# Patient Record
Sex: Male | Born: 1935 | Race: White | Hispanic: No | Marital: Married | Smoking: Former smoker
Health system: Southern US, Community
[De-identification: ages and names within clinical notes are randomized; demographics above are authoritative.]

## PROBLEM LIST (undated history)

## (undated) DIAGNOSIS — M1711 Unilateral primary osteoarthritis, right knee: Secondary | ICD-10-CM

## (undated) DIAGNOSIS — N210 Calculus in bladder: Secondary | ICD-10-CM

## (undated) DIAGNOSIS — Z87442 Personal history of urinary calculi: Secondary | ICD-10-CM

## (undated) DIAGNOSIS — K469 Unspecified abdominal hernia without obstruction or gangrene: Secondary | ICD-10-CM

## (undated) DIAGNOSIS — I4510 Unspecified right bundle-branch block: Secondary | ICD-10-CM

## (undated) DIAGNOSIS — D869 Sarcoidosis, unspecified: Secondary | ICD-10-CM

## (undated) DIAGNOSIS — K922 Gastrointestinal hemorrhage, unspecified: Secondary | ICD-10-CM

## (undated) DIAGNOSIS — R7981 Abnormal blood-gas level: Secondary | ICD-10-CM

## (undated) DIAGNOSIS — N400 Enlarged prostate without lower urinary tract symptoms: Secondary | ICD-10-CM

## (undated) DIAGNOSIS — E785 Hyperlipidemia, unspecified: Secondary | ICD-10-CM

## (undated) HISTORY — DX: Hyperlipidemia, unspecified: E78.5

## (undated) HISTORY — DX: Sarcoidosis, unspecified: D86.9

---

## 1998-07-20 ENCOUNTER — Emergency Department (HOSPITAL_COMMUNITY): Admission: EM | Admit: 1998-07-20 | Discharge: 1998-07-20 | Payer: Self-pay | Admitting: Emergency Medicine

## 1999-01-01 ENCOUNTER — Encounter: Payer: Self-pay | Admitting: Orthopedic Surgery

## 1999-01-01 ENCOUNTER — Ambulatory Visit (HOSPITAL_COMMUNITY): Admission: RE | Admit: 1999-01-01 | Discharge: 1999-01-01 | Payer: Self-pay | Admitting: Orthopedic Surgery

## 1999-04-06 ENCOUNTER — Emergency Department (HOSPITAL_COMMUNITY): Admission: EM | Admit: 1999-04-06 | Discharge: 1999-04-06 | Payer: Self-pay | Admitting: Emergency Medicine

## 2002-05-26 HISTORY — PX: CYSTOSCOPY: SHX5120

## 2002-10-19 ENCOUNTER — Ambulatory Visit (HOSPITAL_BASED_OUTPATIENT_CLINIC_OR_DEPARTMENT_OTHER): Admission: RE | Admit: 2002-10-19 | Discharge: 2002-10-19 | Payer: Self-pay | Admitting: Urology

## 2003-10-10 ENCOUNTER — Encounter: Admission: RE | Admit: 2003-10-10 | Discharge: 2003-10-10 | Payer: Self-pay | Admitting: Internal Medicine

## 2003-11-08 ENCOUNTER — Encounter (INDEPENDENT_AMBULATORY_CARE_PROVIDER_SITE_OTHER): Payer: Self-pay | Admitting: Specialist

## 2003-11-08 ENCOUNTER — Ambulatory Visit (HOSPITAL_COMMUNITY): Admission: RE | Admit: 2003-11-08 | Discharge: 2003-11-08 | Payer: Self-pay | Admitting: Critical Care Medicine

## 2003-11-08 ENCOUNTER — Encounter (INDEPENDENT_AMBULATORY_CARE_PROVIDER_SITE_OTHER): Payer: Self-pay | Admitting: *Deleted

## 2004-01-18 ENCOUNTER — Ambulatory Visit (HOSPITAL_COMMUNITY): Admission: RE | Admit: 2004-01-18 | Discharge: 2004-01-18 | Payer: Self-pay | Admitting: Critical Care Medicine

## 2004-04-09 ENCOUNTER — Ambulatory Visit: Payer: Self-pay | Admitting: Gastroenterology

## 2004-04-09 ENCOUNTER — Ambulatory Visit: Payer: Self-pay | Admitting: Critical Care Medicine

## 2004-04-23 ENCOUNTER — Ambulatory Visit: Payer: Self-pay | Admitting: Gastroenterology

## 2004-05-30 ENCOUNTER — Ambulatory Visit: Payer: Self-pay | Admitting: Critical Care Medicine

## 2004-07-03 ENCOUNTER — Ambulatory Visit: Payer: Self-pay | Admitting: Critical Care Medicine

## 2005-05-26 HISTORY — PX: ANTERIOR CERVICAL DECOMP/DISCECTOMY FUSION: SHX1161

## 2005-05-26 HISTORY — PX: BRONCHOSCOPY: SUR163

## 2005-08-13 ENCOUNTER — Ambulatory Visit: Payer: Self-pay | Admitting: Critical Care Medicine

## 2005-09-03 ENCOUNTER — Ambulatory Visit: Payer: Self-pay | Admitting: Critical Care Medicine

## 2005-09-04 ENCOUNTER — Ambulatory Visit: Payer: Self-pay | Admitting: Cardiology

## 2005-09-09 ENCOUNTER — Encounter (INDEPENDENT_AMBULATORY_CARE_PROVIDER_SITE_OTHER): Payer: Self-pay | Admitting: *Deleted

## 2005-09-09 ENCOUNTER — Ambulatory Visit (HOSPITAL_COMMUNITY): Admission: RE | Admit: 2005-09-09 | Discharge: 2005-09-09 | Payer: Self-pay | Admitting: Critical Care Medicine

## 2005-09-09 ENCOUNTER — Ambulatory Visit: Payer: Self-pay | Admitting: Critical Care Medicine

## 2005-09-25 ENCOUNTER — Ambulatory Visit: Payer: Self-pay | Admitting: Critical Care Medicine

## 2005-10-28 ENCOUNTER — Encounter: Admission: RE | Admit: 2005-10-28 | Discharge: 2005-10-28 | Payer: Self-pay | Admitting: Internal Medicine

## 2005-11-12 ENCOUNTER — Ambulatory Visit: Payer: Self-pay | Admitting: Critical Care Medicine

## 2006-03-09 ENCOUNTER — Inpatient Hospital Stay (HOSPITAL_COMMUNITY): Admission: RE | Admit: 2006-03-09 | Discharge: 2006-03-11 | Payer: Self-pay | Admitting: Neurosurgery

## 2006-09-16 ENCOUNTER — Encounter: Admission: RE | Admit: 2006-09-16 | Discharge: 2006-09-16 | Payer: Self-pay | Admitting: Neurosurgery

## 2007-02-23 ENCOUNTER — Ambulatory Visit (HOSPITAL_COMMUNITY): Admission: RE | Admit: 2007-02-23 | Discharge: 2007-02-23 | Payer: Self-pay

## 2007-03-30 DIAGNOSIS — M1990 Unspecified osteoarthritis, unspecified site: Secondary | ICD-10-CM | POA: Insufficient documentation

## 2007-03-30 DIAGNOSIS — J8409 Other alveolar and parieto-alveolar conditions: Secondary | ICD-10-CM | POA: Insufficient documentation

## 2007-04-20 ENCOUNTER — Encounter: Admission: RE | Admit: 2007-04-20 | Discharge: 2007-04-20 | Payer: Self-pay | Admitting: Neurosurgery

## 2007-05-27 HISTORY — PX: INGUINAL HERNIA REPAIR: SUR1180

## 2008-02-01 ENCOUNTER — Emergency Department (HOSPITAL_COMMUNITY): Admission: EM | Admit: 2008-02-01 | Discharge: 2008-02-01 | Payer: Self-pay | Admitting: Emergency Medicine

## 2008-03-02 ENCOUNTER — Ambulatory Visit (HOSPITAL_BASED_OUTPATIENT_CLINIC_OR_DEPARTMENT_OTHER): Admission: RE | Admit: 2008-03-02 | Discharge: 2008-03-02 | Payer: Self-pay | Admitting: Surgery

## 2009-01-16 ENCOUNTER — Encounter: Admission: RE | Admit: 2009-01-16 | Discharge: 2009-01-16 | Payer: Self-pay | Admitting: Otolaryngology

## 2009-02-26 ENCOUNTER — Encounter (INDEPENDENT_AMBULATORY_CARE_PROVIDER_SITE_OTHER): Payer: Self-pay | Admitting: *Deleted

## 2009-12-05 ENCOUNTER — Telehealth: Payer: Self-pay | Admitting: Gastroenterology

## 2010-06-16 ENCOUNTER — Encounter: Payer: Self-pay | Admitting: Critical Care Medicine

## 2010-06-16 ENCOUNTER — Encounter: Payer: Self-pay | Admitting: Neurosurgery

## 2010-06-27 NOTE — Progress Notes (Signed)
Summary: Schedule Colonoscopy  Phone Note Outgoing Call Call back at [facility] Phone [phone]   Call placed by: Harlow Mares CMA Duncan Dull),  December 05, 2009 11:47 AM Call placed to: Patient Summary of Call: patients housekeeper answered the phone and wanted me to call back and leave a message on the machine for the pt to listen to when they get back into town next week. I did and will await his call back. Initial call taken by: Harlow Mares CMA Duncan Dull),  December 05, 2009 11:48 AM  Follow-up for Phone Call        patients wife states that they have left the practice when Dr. Corinda Gubler retired. we will note it in IDX Follow-up by: Harlow Mares CMA Duncan Dull),  December 12, 2009 4:18 PM

## 2010-10-08 NOTE — Op Note (Signed)
Eugene [name], Eugene [name]                ACCOUNT NO.:  [id number]   MEDICAL RECORD NO.:  [id number]          PATIENT TYPE:  AMB   LOCATION:  [facility]                         FACILITY:  [facility]   PHYSICIAN:  Currie Paris, M.D.DATE OF BIRTH:  1932/11/17   DATE OF PROCEDURE:  03/02/2008  DATE OF DISCHARGE:                               OPERATIVE REPORT   OFFICE MEDICAL:  [id number].   PREOPERATIVE DIAGNOSIS:  Right inguinal hernia.   POSTOPERATIVE DIAGNOSIS:  Right inguinal hernia.   OPERATION:  Repair of right inguinal hernia.   SURGEON:  Currie Paris, M.D.   ANESTHESIA:  General.   CLINICAL HISTORY:  This is a 77 year old gentleman with a symptomatic,  moderately sized right inguinal hernia that was reducible.   DESCRIPTION OF PROCEDURE:  The patient was seen in the holding area had  no further questions.  We confirmed that the right side was the  operative side and that he was having a hernia repair.  I initialed the  right inguinal area with the patient.   The patient was taken to the operating and was given satisfactory  general (LMA)anesthesia.  The groin area was clipped, prepped and  draped.  Time out was done.   To help with postoperative pain relief, I infiltrated the skin incision  area with 0.25% plain Marcaine.  I also put some below the fascia of the  anterior superior iliac spine.  An inguinal incision was made and deep into the external oblique  aponeurosis with bleeders tied or cauterized.   The external oblique was opened to line up its fibers.  The cord  structures were identified and dissected up off the inguinal floor and  surrounded with a Penrose drain.  I freed them up completely off the  inguinal floor and the inguinal floor appeared to be somewhat thin, but  intact.   However, there was a large amount of tissue coming through the inner  medial aspect of the cord, most of which was fatty tissue and it was  able to be stripped off of the cord and  reduced.  I divided some of the  cremaster fibers to be sure we had this all identified well and to make  sure that we did not damage the vessels or the vas.   Once I had everything reduced and the anatomy clearly identified, I  tightened the deep ring with some sutures of 2-0 Prolene putting some of  them just medial to the deep ring and some just lateral to tighten this  down and keep the hernia reduced.   I then took a piece of Marlex mesh and cut it in shape, split it  laterally and sutured it in with a running suture of 2-0 Prolene along  the flexion of the inguinal ligament and then up to the deep ring.  It  was then laid on top of the internal oblique well medially and going  well lateral to the cord structures and deep ring intact with several  sutures of 2-0 Prolene.  I infiltrated more Marcaine in the deeper  layers again to  help with postoperative  pain relief.   Once this was done, I made sure everything was dry.  I then closed in  layers with 3-0 Vicryl to close the external oblique and Scarpa's, 4-0  Monocryl subcuticular plus Dermabond for the skin.   The patient tolerated the procedure well and there were no  complications.  All counts were correct.      Currie Paris, M.D.  Electronically Signed     CJS/MEDQ  D:  03/02/2008  T:  03/02/2008  Job:  [id number]

## 2010-10-11 NOTE — Op Note (Signed)
NAMETREVONNE, NYLAND                ACCOUNT NO.:  [id number]   MEDICAL RECORD NO.:  [id number]          PATIENT TYPE:  AMB   LOCATION:  ENDO                         FACILITY:  [facility]   PHYSICIAN:  Shan Levans, M.D. LHCDATE OF BIRTH:  March 30, 1933   DATE OF PROCEDURE:  09/09/2005  DATE OF DISCHARGE:                                 OPERATIVE REPORT   PROCEDURE:  Bronchoscopy.   INDICATIONS:  Left lower lobe infiltrate, evaluate for cause.   OPERATOR:  Shan Levans, M.D.   ANESTHESIA:  1% Xylocaine local.  Preoperative medication:  Demerol 30 mg,  Versed 4 mg IV push.   PROCEDURE:  The Olympus video bronchoscope was introduced via the right  naris.  The upper airways were visualized and were unremarkable.  The entire  tracheobronchial tree was visualized and revealed diffuse tracheobronchitis  without evidence of endobronchial lesion.  Attention was then paid to the  left lower lobe orifice.  Transbronchial biopsies x6 were obtained.  Bronchoalveolar lavage was obtained, 150 mL volume infused, 80 mL volume  return.   COMPLICATIONS:  None.   IMPRESSION:  Left lower lobe infiltrate, evaluate for cause.  Evaluate for  organized pneumonia.   RECOMMENDATIONS:  Follow up microbiology and pathology.      Shan Levans, M.D. [facility]  Electronically Signed     PW/MEDQ  D:  09/09/2005  T:  09/10/2005  Job:  [id number]

## 2010-10-11 NOTE — Consult Note (Signed)
Eugene [name], Eugene [name]                ACCOUNT NO.:  [id number]   MEDICAL RECORD NO.:  [id number]          PATIENT TYPE:  INP   LOCATION:  3005                         FACILITY:  [facility]   PHYSICIAN:  Maretta Bees. Vonita Moss, M.D.DATE OF BIRTH:  December 27, 1932   DATE OF CONSULTATION:  03/10/2006  DATE OF DISCHARGE:                                   CONSULTATION   HISTORY:  This 77 year old gentleman has had troubles voiding postop and,  therefore, a consultation was requested by Dr. Shirlean Kelly.  He  underwent cervical spine surgery yesterday and had a Foley catheter put in  in the recovery room.  It was removed this morning and he could not void.  He is now on intermittent catheterization.  He has been given 0.8 mg of  Flomax this morning.  I have seen Eugene [name] in the past and he has bladder  outlet obstructive symptoms that have been treated in the past with Flomax,  but basically have been controlled for the last several months with Proscar  on a daily basis.  He also has a past history of bladder stones.  He has had  PSA elevations in the past, but his last PSA was 2.14 even after being  doubled on Proscar.  He also has benign renal cysts.   MEDICAL PROBLEMS:  Include:  1. Hypercholesterolemia.  2. Sarcoidosis.   ALLERGIES TO DRUGS:  DENIED.   PAST SURGICAL HISTORY:  He has had no significant major surgery in the past.   FAMILY HISTORY:  Noncontributory.   REVIEW OF SYSTEMS:  He is obviously somewhat immobilized with his neck  brace, but he is up and about, has been moving around this afternoon.  He  denies chest pain, shortness of breath, abdominal pain, some bladder  discomfort and a catheter was in place.  When Foley was in place, this  resolved.  He has no dysuria, hematuria, scrotal pain.   He is alert and oriented.  Skin is warm and dry.  Abdomen is soft and  nontender.  No suprapubic fullness.  Penis, urethra meatus. scrotum, testes  and epididymes are unremarkable with no  evidence of bleeding.   IMPRESSION:  1. Postop urinary retention.  2. Bladder neck contracture.  3. Bladder stones.  4. Elevated PSA in the past.  5. Benign renal cysts.   PLAN:  He will continue on his Proscar, but he needs to have continued  therapy with the Flomax and I wrote him a prescription.  I talked to he and  his wife about his in and out catheterizations and if he does not start  voiding well  overnight, tomorrow's options include going home with a Foley catheter for a  couple days, but I encouraged and he agreed to learning intermittent self-  catheterization as a best way to manage his postop retention, which I  believe will likely resolve with time.  We will check on this gentleman in  the morning and monitor his clinical course.      Maretta Bees. Vonita Moss, M.D.  Electronically Signed     LJP/MEDQ  D:  03/10/2006  T:  03/11/2006  Job:  [id number]   cc:   Hewitt Shorts, M.D.

## 2010-10-11 NOTE — Op Note (Signed)
   Eugene [name], Eugene [name]                            ACCOUNT NO.:  [id number]   MEDICAL RECORD NO.:  [id number]                   PATIENT TYPE:  AMB   LOCATION:  [facility]                                 FACILITY:  [facility]   PHYSICIAN:  Maretta Bees. Vonita Moss, M.D.             DATE OF BIRTH:  December 28, 1932   DATE OF PROCEDURE:  10/19/2002  DATE OF DISCHARGE:                                 OPERATIVE REPORT   PREOPERATIVE DIAGNOSES:  Hematuria and bladder stones.   POSTOPERATIVE DIAGNOSES:  Hematuria and bladder stones.   PROCEDURES:  1. Cystoscopy.  2. Bilateral retrograde pyelograms with interpretation.  3. EHL of bladder stone.   SURGEON:  Maretta Bees. Vonita Moss, M.D.   ANESTHESIA:  General.   INDICATIONS:  This 77 year old white male has had a long history of some  hematuria after he runs and known bladder stones.  He is brought to the OR  today for further hematuria workup and removal of these bladder stones.   DESCRIPTION OF PROCEDURE:  The patient is brought to the operating room and  placed in the lithotomy position.  External genitalia were prepped and  draped in the usual fashion.   The cystoscope of the anterior urethra was normal.  The prostate had  bilateral hypertrophy with a median lobe obstructing the bladder neck.  The  bladder was trabeculated and he had two small gold and yellow stones, and a  large round golden stone measuring approximately 4 cm in size.   Bilateral retrograde pyelograms were obtained with the cone-tipped catheter.  The bilateral ureters and pyelocalyceal systems were normal, with no  obstruction or filling defects.  I then used the EHL probe at power level 3  and fractured the stone to several small pieces, and irrigated the fragments  out.  There were a couple of tiny stone fragments tucked in the mucosa that  I believe will pass later, but I could not wash off right now.   At this point the bladder was observed, and there was minimal bleeding.  The  bladder  was emptied.  The scope removed.  The patient was sent to recovery  room in good condition, having tolerated the procedure well.                                               Maretta Bees. Vonita Moss, M.D.    LJP/MEDQ  D:  10/19/2002  T:  10/19/2002  Job:  [id number]

## 2010-10-11 NOTE — Op Note (Signed)
NAMEBEAR, OSTEN                            ACCOUNT NO.:  [id number]   MEDICAL RECORD NO.:  [id number]                   PATIENT TYPE:  AMB   LOCATION:  ENDO                                 FACILITY:  [facility]   PHYSICIAN:  Shan Levans, M.D. [facility]            DATE OF BIRTH:  [birth year]   DATE OF PROCEDURE:  11/08/2003  DATE OF DISCHARGE:                                 OPERATIVE REPORT   PROCEDURE PERFORMED:  Bronchoscopy.   INDICATIONS FOR PROCEDURE:  Left lower lobe infiltrate, evaluate for cause.   OPERATOR:  Shan Levans, M.D.   ANESTHESIA:  1% Xylocaine local.   PREOPERATIVE MEDICATION:  Demerol 50 mg IV push, Versed 5 mg IV push.   DESCRIPTION OF PROCEDURE:  The Olympus video bronchoscope was introduced  through the right naris.  The upper airways were visualized, were  unremarkable.  The entire tracheobronchial tree was visualized and revealed  no endobronchial lesions.  Attention was then paid to the left lower lobe  orifice.  Transbronchial biopsies times six were obtained without  complications.  Prior to this, a bronchoalveolar lavage was performed.  Approximately 100 mL of volume was infused and approximately 45 mL volume  returned.   COMPLICATIONS:  None.   IMPRESSION:  Chronic infiltrate left lower lobe, rule out bronchiolitis  obliterans organized pneumonia, rule out bronchoalveolar carcinoma.   RECOMMENDATIONS:  Follow-up microbiology and pathology.                                               Shan Levans, M.D. [facility]    PW/MEDQ  D:  11/08/2003  T:  11/08/2003  Job:  [id number]   cc:   Ulyess Mort, M.D. [facility]

## 2010-10-11 NOTE — Op Note (Signed)
NAMEDEVONTRE, SIEDSCHLAG                ACCOUNT NO.:  [id number]   MEDICAL RECORD NO.:  [id number]          PATIENT TYPE:  INP   LOCATION:  3005                         FACILITY:  [facility]   PHYSICIAN:  Hewitt Shorts, M.D.DATE OF BIRTH:  1932-07-08   DATE OF PROCEDURE:  03/09/2006  DATE OF DISCHARGE:                                 OPERATIVE REPORT   PREOPERATIVE DIAGNOSES:  Cervical stenosis, cervical spondylosis with  myelopathy, cervical degenerative disk disease with multiparity.   POSTOPERATIVE DIAGNOSES:  Cervical stenosis, cervical spondylosis with  myelopathy, cervical degenerative disk disease with multiparity.   PROCEDURES:  C3-4 and C5-6 anterior cervical diskectomy and arthrodesis with  AVS PEEK interbody implants, DBX putty and tethered cervical plating.   SURGEON:  Hewitt Shorts, M.D.   ASSISTANTS:  Clydene Fake, M.D.  Webb Silversmith, R.N.   INDICATIONS:  The patient is a 77 year old man, who presented with  myelopathy with both sensory and motor changes.  He had extensive  spondylosis and degenerative disk disease of the cervical spine, with  significant stenosis at the C5-6 level, with some increased signals within  the spinal cord at that level, with significant canal encroachment as well  at C3-4.  He had an ankylosis at C4-5.  The decision was made to proceed  with a C3-4 and C5-6 anterior cervical diskectomy and arthrodesis.   DESCRIPTION OF PROCEDURES:  The patient was brought to the operating room,  placed under general endotracheal anesthesia.  The patient was placed in 10  pounds of halter traction and the neck was prepped with Betadine Soap and  Solution and draped in a sterile fashion.  An oblique incision was made in  the left side of the neck, paralleling the anterior border of the left  sternocleidomastoid.  The line of incision was infiltrated with local  anesthetic with epinephrine.  The dissection was carried down through the  subcutaneous tissue  and platysma and bipolar cautery was used to maintain  hemostasis.  Dissection was then carried out through an avascular plane,  leaving the sternocleidomastoid, carotid artery and jugular vein laterally  and trachea and esophagus medially.  The ventral aspect of the vertebral  column was identified, and a localizing x-ray was taken and the C3-4 and C5-  6 intervertebral disk spaces identified.  At each level, the annulus was  incised.  Anterior osteophytic overgrowth was removed using Kerrison punches  and the osteophyte removal tool, as well as the X-Max drill.  We entered  into the disk space at each level and proceeded with a diskectomy using  curets, microcurets and pituitary rongeurs.  We then draped the microscope,  which was brought into the field to provide additional magnification,  illumination and visualization, and the remainder of the decompression was  performed using microdissection and microsurgical technique.  There were  significant spondylytic degenerative changes at each level.  The  cartilaginous endplates, which were quite spondylytic, were removed using  microcurets, along with the X-Max drill.  And then, posterior osteophytic  overgrowth at each level was carefully removed using the X-Max drill, along  with  the 2-mm Kerrison punch with a thin footplate.  The spondylytic changes  of the posterior longitudinal ligament were removed, and the spinal canal  and thecal sac were decompressed at each level.  Once the decompression was  completed at each level, hemostasis was established with the use of Gelfoam  soaked in thrombin, and then we measured the height of the intervertebral  disk spaces.  We elected a 5-mm AVS PEEK interbody implant for the C3-4  level and a 6-mm AVS PEEK interbody implant for the C5-6 level.  Each of  these was packed with DBX putty, and each of the implants were placed and  countersunk within the intervertebral disk space, placing the 5-mm  implant  at C3-4, and the 6-mm implant at C5-6.  We then selected a 14-mm tethered  cervical plate for the Z6-1 level, and a 16-mm tethered cervical plate for  the W9-6 level.  Each plate was positioned over the fusion construct and  secured to each of the vertebrae with a pair of 4-mm variable-angle screws,  using 14-mm screws at C3, C4 and C6, and 13-mm screws at C5.  Each screw  hole was drilled and tapped.  Screws were placed in alternating fashion, and  then, once all 4 screws were placed in each plate, final tightening was done  of each of the screws.  The wound was irrigated with Bacitracin numerous  times during the procedure, and at the end, it was irrigated once again.  Hemostasis was established and confirmed, and then we proceeded with  closure.  The platysma was closed with interrupted, 2-0 undyed Vicryl  suture.  The subcutaneous, subcuticular was closed with interrupted,  inverted 3-0 undyed Vicryl suture, and the skin edges were closed with  Dermabond.  Following the procedure, the patient was placed in an Aspen  cervical collar.  The cervical traction was discontinued.  The anesthetic  was reversed and the patient extubated and transferred to the recovery room  for further care, where he was noted to be moving all 4 extremities.      Hewitt Shorts, M.D.  Electronically Signed     RWN/MEDQ  D:  03/09/2006  T:  03/09/2006  Job:  [id number]

## 2011-02-24 LAB — POCT HEMOGLOBIN-HEMACUE: Hemoglobin: 10 — ABNORMAL LOW

## 2011-04-15 ENCOUNTER — Other Ambulatory Visit: Payer: Self-pay | Admitting: Otolaryngology

## 2011-04-15 DIAGNOSIS — R42 Dizziness and giddiness: Secondary | ICD-10-CM

## 2011-04-23 ENCOUNTER — Ambulatory Visit
Admission: RE | Admit: 2011-04-23 | Discharge: 2011-04-23 | Disposition: A | Discharge status: Home / Self Care | Payer: Medicare Other | Source: Ambulatory Visit | Attending: Otolaryngology | Admitting: Otolaryngology

## 2011-04-23 DIAGNOSIS — R42 Dizziness and giddiness: Secondary | ICD-10-CM

## 2011-04-23 MED ORDER — GADOBENATE DIMEGLUMINE 529 MG/ML IV SOLN
18.0000 mL | Freq: Once | INTRAVENOUS | Status: AC | PRN
  Administered 2011-04-23: 18 mL via INTRAVENOUS

## 2011-06-10 ENCOUNTER — Other Ambulatory Visit: Payer: Self-pay | Admitting: Dermatology

## 2011-06-10 DIAGNOSIS — L821 Other seborrheic keratosis: Secondary | ICD-10-CM | POA: Diagnosis not present

## 2011-06-10 DIAGNOSIS — L578 Other skin changes due to chronic exposure to nonionizing radiation: Secondary | ICD-10-CM | POA: Diagnosis not present

## 2011-09-22 ENCOUNTER — Other Ambulatory Visit: Payer: Self-pay | Admitting: Dermatology

## 2011-09-22 DIAGNOSIS — B079 Viral wart, unspecified: Secondary | ICD-10-CM | POA: Diagnosis not present

## 2011-09-22 DIAGNOSIS — L988 Other specified disorders of the skin and subcutaneous tissue: Secondary | ICD-10-CM | POA: Diagnosis not present

## 2011-09-22 DIAGNOSIS — L578 Other skin changes due to chronic exposure to nonionizing radiation: Secondary | ICD-10-CM | POA: Diagnosis not present

## 2011-10-09 DIAGNOSIS — H353190 Nonexudative age-related macular degeneration, unspecified eye, stage unspecified: Secondary | ICD-10-CM | POA: Diagnosis not present

## 2011-10-09 DIAGNOSIS — Z961 Presence of intraocular lens: Secondary | ICD-10-CM | POA: Diagnosis not present

## 2011-10-30 DIAGNOSIS — N401 Enlarged prostate with lower urinary tract symptoms: Secondary | ICD-10-CM | POA: Diagnosis not present

## 2011-12-19 DIAGNOSIS — H43819 Vitreous degeneration, unspecified eye: Secondary | ICD-10-CM | POA: Diagnosis not present

## 2011-12-19 DIAGNOSIS — H35379 Puckering of macula, unspecified eye: Secondary | ICD-10-CM | POA: Diagnosis not present

## 2011-12-23 DIAGNOSIS — N400 Enlarged prostate without lower urinary tract symptoms: Secondary | ICD-10-CM | POA: Diagnosis not present

## 2011-12-23 DIAGNOSIS — Z1331 Encounter for screening for depression: Secondary | ICD-10-CM | POA: Diagnosis not present

## 2011-12-23 DIAGNOSIS — E782 Mixed hyperlipidemia: Secondary | ICD-10-CM | POA: Diagnosis not present

## 2011-12-23 DIAGNOSIS — M5090 Cervical disc disorder, unspecified, unspecified cervical region: Secondary | ICD-10-CM | POA: Diagnosis not present

## 2011-12-23 DIAGNOSIS — D869 Sarcoidosis, unspecified: Secondary | ICD-10-CM | POA: Diagnosis not present

## 2012-02-20 ENCOUNTER — Other Ambulatory Visit: Payer: Self-pay | Admitting: Dermatology

## 2012-02-20 DIAGNOSIS — L821 Other seborrheic keratosis: Secondary | ICD-10-CM | POA: Diagnosis not present

## 2012-02-20 DIAGNOSIS — B079 Viral wart, unspecified: Secondary | ICD-10-CM | POA: Diagnosis not present

## 2012-02-20 DIAGNOSIS — D1801 Hemangioma of skin and subcutaneous tissue: Secondary | ICD-10-CM | POA: Diagnosis not present

## 2012-02-20 DIAGNOSIS — D2330 Other benign neoplasm of skin of unspecified part of face: Secondary | ICD-10-CM | POA: Diagnosis not present

## 2012-02-20 DIAGNOSIS — L570 Actinic keratosis: Secondary | ICD-10-CM | POA: Diagnosis not present

## 2012-02-20 DIAGNOSIS — D485 Neoplasm of uncertain behavior of skin: Secondary | ICD-10-CM | POA: Diagnosis not present

## 2012-03-27 DIAGNOSIS — Z23 Encounter for immunization: Secondary | ICD-10-CM | POA: Diagnosis not present

## 2012-04-08 ENCOUNTER — Other Ambulatory Visit (HOSPITAL_COMMUNITY): Payer: Self-pay

## 2012-04-08 ENCOUNTER — Other Ambulatory Visit (HOSPITAL_COMMUNITY): Payer: Self-pay | Admitting: Cardiology

## 2012-04-08 DIAGNOSIS — I359 Nonrheumatic aortic valve disorder, unspecified: Secondary | ICD-10-CM

## 2012-04-29 ENCOUNTER — Ambulatory Visit (HOSPITAL_COMMUNITY)
Admission: RE | Admit: 2012-04-29 | Discharge: 2012-04-29 | Disposition: A | Discharge status: Home / Self Care | Payer: Medicare Other | Source: Ambulatory Visit | Attending: Cardiovascular Disease | Admitting: Cardiovascular Disease

## 2012-04-29 DIAGNOSIS — I359 Nonrheumatic aortic valve disorder, unspecified: Secondary | ICD-10-CM | POA: Diagnosis not present

## 2012-04-29 DIAGNOSIS — I079 Rheumatic tricuspid valve disease, unspecified: Secondary | ICD-10-CM | POA: Insufficient documentation

## 2012-04-29 DIAGNOSIS — R9430 Abnormal result of cardiovascular function study, unspecified: Secondary | ICD-10-CM | POA: Diagnosis not present

## 2012-04-29 HISTORY — PX: DOPPLER ECHOCARDIOGRAPHY: SHX263

## 2012-04-29 HISTORY — PX: CARDIOVASCULAR STRESS TEST: SHX262

## 2012-04-29 MED ORDER — TECHNETIUM TC 99M SESTAMIBI GENERIC - CARDIOLITE
30.9000 | Freq: Once | INTRAVENOUS | Status: AC | PRN
  Administered 2012-04-29: 30.9 via INTRAVENOUS

## 2012-04-29 MED ORDER — TECHNETIUM TC 99M SESTAMIBI GENERIC - CARDIOLITE
11.0000 | Freq: Once | INTRAVENOUS | Status: AC | PRN
  Administered 2012-04-29: 11 via INTRAVENOUS

## 2012-04-29 NOTE — Procedures (Signed)
[facility] [address] [place] [address] [phone]  Cardiology Nuclear Med Study  Eugene [name] is a 78 y.o. male     MRN : [id number]     DOB: Feb 02, 1933  Procedure Date: 04/29/2012  Nuclear Med Background Indication for Stress Test:  Abnormal EKG History:  No prior cardiac history Cardiac Risk Factors: Lipids  Symptoms:  None   Nuclear Pre-Procedure Caffeine/Decaff Intake:  7:00pm NPO After: 5:00am   IV Site: R Antecubital  IV 0.9% NS with Angio Cath:  22g  Chest Size (in):  42 IV Started by: Koren Shiver, CNMT  Height: 5\' 10"  (1.778 m)  Cup Size: n/a  BMI:  Body mass index is 27.98 kg/(m^2). Weight:  195 lb (88.451 kg)   Tech Comments:  n/a    Nuclear Med Study 1 or 2 day study: 1 day  Stress Test Type:  Stress  Order Authorizing Provider:  Nicki Guadalajara, MD   Resting Radionuclide: Technetium 47m Sestamibi  Resting Radionuclide Dose: 11.0 mCi   Stress Radionuclide:  Technetium 18m Sestamibi  Stress Radionuclide Dose: 30.9 mCi           Stress Protocol Rest HR: 61 Stress HR: 131  Rest BP: 138/70 Stress BP: 205/75  Exercise Time (min): 06:30 METS: 7.0   Predicted Max HR: 143 bpm % Max HR: 91.61 bpm Rate Pressure Product: 44010   Dose of Adenosine (mg):  n/a Dose of Lexiscan: n/a mg  Dose of Atropine (mg): n/a Dose of Dobutamine: n/a mcg/kg/min (at max HR)  Stress Test Technologist: Esperanza Sheets, CCT Nuclear Technologist: Gonzella Lex, CNMT   Rest Procedure:  Myocardial perfusion imaging was performed at rest 45 minutes following the intravenous administration of Technetium 39m Sestamibi. Stress Procedure:  The patient performed treadmill exercise using a Bruce  Protocol for 6:30 minutes. The patient stopped due to Fatigue and denied any chest pain.  There were no significant ST-T wave changes.  Technetium 50m Sestamibi was injected at peak exercise and myocardial perfusion imaging was performed  after a brief delay.  Transient Ischemic Dilatation (Normal <1.22):  1.02 Lung/Heart Ratio (Normal <0.45):  0.31 QGS EDV:  99 ml QGS ESV:  40 ml LV Ejection Fraction: 60%  Signed by:

## 2012-04-29 NOTE — Progress Notes (Signed)
2D Echo Performed 04/29/2012    Joseph Johns, RCS  

## 2012-04-29 NOTE — CV Procedure (Signed)
[facility] [address]  [address]  [place] [address]  [phone]  Cardiology Nuclear Med Study  Eugene [name] is a 78 y.o. male MRN : [id number] DOB: 16-Sep-1932  Procedure Date: 04/29/2012  Nuclear Med Background  Indication for Stress Test: Abnormal EKG  History: No prior cardiac history  Cardiac Risk Factors: Lipids  Symptoms: None  Nuclear Pre-Procedure  Caffeine/Decaff Intake: 7:00pm  NPO After: 5:00am   IV Site: R Antecubital  IV 0.9% NS with Angio Cath: 22g   Chest Size (in): 42  IV Started by: Koren Shiver, CNMT   Height: 5\' 10"  (1.778 m)  Cup Size: n/a   BMI: Body mass index is 27.98 kg/(m^2).  Weight: 195 lb (88.451 kg)    Tech Comments: n/a   Nuclear Med Study  1 or 2 day study: 1 day  Stress Test Type: Stress   Order Authorizing Provider: Nicki Guadalajara, MD    Resting Radionuclide: Technetium 62m Sestamibi  Resting Radionuclide Dose: 11.0 mCi   Stress Radionuclide: Technetium 23m Sestamibi  Stress Radionuclide Dose: 30.9 mCi   Stress Protocol  Rest HR: 61  Stress HR: 131   Rest BP: 138/70  Stress BP: 205/75   Exercise Time (min): 06:30  METS: 7.0   Predicted Max HR: 143 bpm  % Max HR: 91.61 bpm  Rate Pressure Product: 21308  Dose of Adenosine (mg): n/a  Dose of Lexiscan: n/a mg   Dose of Atropine (mg): n/a  Dose of Dobutamine: n/a mcg/kg/min (at max HR)   Stress Test Technologist: Esperanza Sheets, CCT  Nuclear Technologist: Gonzella Lex, CNMT   Rest Procedure: Myocardial perfusion imaging was performed at rest 45 minutes following the intravenous administration of Technetium 3m Sestamibi.  Stress Procedure: The patient performed treadmill exercise using a Bruce Protocol for 6:30 minutes. The patient stopped due to Fatigue and denied any chest pain. There were no significant ST-T wave changes. Technetium 88m Sestamibi was injected at peak exercise and myocardial perfusion imaging was performed after a brief delay.   Transient Ischemic Dilatation (Normal <1.22): 1.02  Lung/Heart Ratio (Normal <0.45): 0.31  QGS EDV: 99 ml  QGS ESV: 40 ml  LV Ejection Fraction: 60%   PHYSICIAN READ:  Rest ECG: NSR, ~incomplete RBBB, rate 62 bpm, TWI in aVL  Stress ECG: Insignificant upsloping ST segment depression. and Occasional PVCs noted during all stages of exercise and early recover  QPS Raw Data Images:  Normal; no motion artifact; normal heart/lung ratio. Stress Images:  There is decreased uptake in the inferior wall.  Consistent with diaphragmatic attenuation from gastric air bubble. Rest Images:  There is decreased uptake in the inferior wall. Consistent with diaphragmatic attenuation from gastric air bubble. Subtraction (SDS):  No evidence of ischemia. or Infarction.  Impression Exercise Capacity:  Fair exercise capacity. BP Response:  Hypertensive blood pressure response. Clinical Symptoms:  No symptoms. ECG Impression:  There are scattered PVCs. Comparison with Prior Nuclear Study: No significant change from previous study  LV Wall Motion:  NL LV Function; NL Wall Motion  Overall Impression:  Normal stress nuclear study., Low risk stress nuclear study. and When compared to prior study performed there has been no significant change.   Marykay Lex, M.D., M.S. [facility] [address] [place], [place]  [address]  [phone] Pager # [phone] 04/29/2012 10:52 AM

## 2012-05-04 DIAGNOSIS — E782 Mixed hyperlipidemia: Secondary | ICD-10-CM | POA: Diagnosis not present

## 2012-08-11 DIAGNOSIS — H04559 Acquired stenosis of unspecified nasolacrimal duct: Secondary | ICD-10-CM | POA: Diagnosis not present

## 2012-08-24 DIAGNOSIS — H01009 Unspecified blepharitis unspecified eye, unspecified eyelid: Secondary | ICD-10-CM | POA: Diagnosis not present

## 2012-08-24 DIAGNOSIS — H04209 Unspecified epiphora, unspecified lacrimal gland: Secondary | ICD-10-CM | POA: Diagnosis not present

## 2012-08-24 DIAGNOSIS — H0019 Chalazion unspecified eye, unspecified eyelid: Secondary | ICD-10-CM | POA: Diagnosis not present

## 2012-09-16 ENCOUNTER — Encounter: Payer: Self-pay | Admitting: Cardiovascular Disease

## 2012-11-16 DIAGNOSIS — N401 Enlarged prostate with lower urinary tract symptoms: Secondary | ICD-10-CM | POA: Diagnosis not present

## 2012-11-16 DIAGNOSIS — R972 Elevated prostate specific antigen [PSA]: Secondary | ICD-10-CM | POA: Diagnosis not present

## 2012-11-16 DIAGNOSIS — Z87442 Personal history of urinary calculi: Secondary | ICD-10-CM | POA: Diagnosis not present

## 2012-12-23 DIAGNOSIS — Z961 Presence of intraocular lens: Secondary | ICD-10-CM | POA: Diagnosis not present

## 2012-12-23 DIAGNOSIS — E782 Mixed hyperlipidemia: Secondary | ICD-10-CM | POA: Diagnosis not present

## 2012-12-23 DIAGNOSIS — Z1331 Encounter for screening for depression: Secondary | ICD-10-CM | POA: Diagnosis not present

## 2012-12-23 DIAGNOSIS — N400 Enlarged prostate without lower urinary tract symptoms: Secondary | ICD-10-CM | POA: Diagnosis not present

## 2012-12-23 DIAGNOSIS — D869 Sarcoidosis, unspecified: Secondary | ICD-10-CM | POA: Diagnosis not present

## 2012-12-23 DIAGNOSIS — Z0000 Encounter for general adult medical examination without abnormal findings: Secondary | ICD-10-CM | POA: Diagnosis not present

## 2012-12-23 DIAGNOSIS — M5090 Cervical disc disorder, unspecified, unspecified cervical region: Secondary | ICD-10-CM | POA: Diagnosis not present

## 2013-01-03 ENCOUNTER — Other Ambulatory Visit: Payer: Self-pay | Admitting: *Deleted

## 2013-01-03 MED ORDER — EZETIMIBE-SIMVASTATIN 10-40 MG PO TABS: 1.0000 | ORAL_TABLET | Freq: Every day | ORAL | Status: DC

## 2013-01-03 NOTE — Telephone Encounter (Signed)
Rx was sent to pharmacy electronically. 

## 2013-02-24 DIAGNOSIS — L723 Sebaceous cyst: Secondary | ICD-10-CM | POA: Diagnosis not present

## 2013-02-24 DIAGNOSIS — Z85828 Personal history of other malignant neoplasm of skin: Secondary | ICD-10-CM | POA: Diagnosis not present

## 2013-02-24 DIAGNOSIS — L570 Actinic keratosis: Secondary | ICD-10-CM | POA: Diagnosis not present

## 2013-02-24 DIAGNOSIS — L821 Other seborrheic keratosis: Secondary | ICD-10-CM | POA: Diagnosis not present

## 2013-02-25 DIAGNOSIS — M1710 Unilateral primary osteoarthritis, unspecified knee: Secondary | ICD-10-CM | POA: Diagnosis not present

## 2013-03-08 DIAGNOSIS — M1710 Unilateral primary osteoarthritis, unspecified knee: Secondary | ICD-10-CM | POA: Diagnosis not present

## 2013-03-09 DIAGNOSIS — Z23 Encounter for immunization: Secondary | ICD-10-CM | POA: Diagnosis not present

## 2013-03-22 DIAGNOSIS — M1710 Unilateral primary osteoarthritis, unspecified knee: Secondary | ICD-10-CM | POA: Diagnosis not present

## 2013-03-25 DIAGNOSIS — M1710 Unilateral primary osteoarthritis, unspecified knee: Secondary | ICD-10-CM | POA: Diagnosis not present

## 2013-04-13 DIAGNOSIS — M25669 Stiffness of unspecified knee, not elsewhere classified: Secondary | ICD-10-CM | POA: Diagnosis not present

## 2013-04-15 DIAGNOSIS — M25669 Stiffness of unspecified knee, not elsewhere classified: Secondary | ICD-10-CM | POA: Diagnosis not present

## 2013-04-19 DIAGNOSIS — M25669 Stiffness of unspecified knee, not elsewhere classified: Secondary | ICD-10-CM | POA: Diagnosis not present

## 2013-04-26 DIAGNOSIS — M25669 Stiffness of unspecified knee, not elsewhere classified: Secondary | ICD-10-CM | POA: Diagnosis not present

## 2013-05-04 ENCOUNTER — Ambulatory Visit (INDEPENDENT_AMBULATORY_CARE_PROVIDER_SITE_OTHER): Payer: Medicare Other | Admitting: Cardiovascular Disease

## 2013-05-04 ENCOUNTER — Encounter: Payer: Self-pay | Admitting: Cardiovascular Disease

## 2013-05-04 VITALS — BP 110/66 | HR 69 | Ht 69.0 in | Wt 190.4 lb

## 2013-05-04 DIAGNOSIS — E785 Hyperlipidemia, unspecified: Secondary | ICD-10-CM

## 2013-05-04 DIAGNOSIS — I350 Nonrheumatic aortic (valve) stenosis: Secondary | ICD-10-CM | POA: Insufficient documentation

## 2013-05-04 DIAGNOSIS — I359 Nonrheumatic aortic valve disorder, unspecified: Secondary | ICD-10-CM

## 2013-05-04 DIAGNOSIS — I358 Other nonrheumatic aortic valve disorders: Secondary | ICD-10-CM

## 2013-05-04 NOTE — Progress Notes (Signed)
Patient ID: Eugene [name], male   DOB: 1932-07-12, 79 y.o.   MRN: [id number]     HPI: Eugene [name] is a 79 y.o. male who presents to the office today for 1 year cardiology evaluation.  Mr. is 79 years old. He has a remote history of sarcoid disease and had been followed Dr. Roseanna Rainbow at [facility] and remotely had been treated with popliteal as well as prednisone with complete remission. He does have a history of mild hyperlipidemia and has been on Vytorin 10/40. He also has a history of BPH which she's been on Proscar. When I saw him last year and one over two systolic murmur in aortic position. An echo Doppler study showed normal systolic function with mild focal basal hypertrophy of the septum. He had normal systolic function. There was evidence for a moderate diffuse calcification involving the noncoronary cusp of a trileaflet aortic valve. He did have sclerosis without stenosis. Valve area was 2.55 cm. RV size was upper normal to mildly dilated. A nuclear perfusion study done last year was unchanged from 6 years previously and continued to show normal perfusion.  Over the past year, Mr. Cottrill has continued to do well. He remains active. He plays tennis and also rides a elliptical and biking. He denies any exertional shortness of breath. Nice chest pressure. He has been having some difficulty with his right knee.   He did have laboratory done by Dr. Eula Listen in July. His chemistry was normal with the exception of an increased glucose at 124. Cholesterol was excellent on Vytorin with a total close to 122 triglycerides 69 HDL 45 LDL 63. TSH was normal 1.3. Angiotensin converting enzyme was normal at 33.  Past Medical History  Diagnosis Date  . Hyperlipidemia     History reviewed. No pertinent past surgical history.  No Known Allergies  Current Outpatient Prescriptions  Medication Sig Dispense Refill  . aspirin 81 MG tablet Take 81 mg by mouth daily.      . finasteride (PROSCAR) 5 MG  tablet Take 5 mg by mouth daily.      Marland Kitchen ezetimibe-simvastatin (VYTORIN) 10-40 MG per tablet Take 1 tablet by mouth at bedtime.  30 tablet  5  . potassium citrate (UROCIT-K) 10 MEQ (1080 MG) SR tablet Take 1 tablet by mouth daily.       No current facility-administered medications for this visit.    History   Social History  . Marital Status: Married    Spouse Name: N/A    Number of Children: N/A  . Years of Education: N/A   Occupational History  . Not on file.   Social History Main Topics  . Smoking status: Former Smoker    Types: Pipe  . Smokeless tobacco: Never Used  . Alcohol Use: 2.0 oz/week    4 drink(s) per week  . Drug Use: Not on file  . Sexual Activity: Not on file   Other Topics Concern  . Not on file   Social History Narrative  . No narrative on file   Socially he is married. He is regional and 9 grandchildren. One son lives in [place], or lives in [place]. There is no tobacco use. He does take occasional alcohol. He does exercise.  History reviewed. No pertinent family history.  ROS is negative for fevers, chills or night sweats.    He denies skin rash. He denies visual changes. He denies shortness of breath. He denies awareness of lymphadenopathy. He denies cough or increased sputum production. He  denies presyncope or syncope. Unaware of any palpitations. He denies chest pressure. He denies nausea vomiting or diarrhea. He does have difficulty with his right knee. He denies myalgias. There are no claudication symptoms. He's unaware of diabetes. He denies cold or heat intolerance. Other comprehensive 12 point system review is negative.  PE BP 110/66  Pulse 69  Ht 5\' 9"  (1.753 m)  Wt 190 lb 6.4 oz (86.365 kg)  BMI 28.10 kg/m2  General: Alert, oriented, no distress.  Skin: normal turgor, no rashes HEENT: Normocephalic, atraumatic. Pupils round and reactive; sclera anicteric;no lid lag.  Nose without nasal septal hypertrophy Mouth/Parynx benign; Mallinpatti  scale 2 Neck: No JVD, no carotid briuts Lungs: clear to ausculatation and percussion; no wheezing or rales Heart: RRR, s1 s2 normal 2/6 systolic murmur and aortic region, no S3. Abdomen: soft, nontender; no hepatosplenomehaly, BS+; abdominal aorta nontender and not dilated by palpation. Back: No CVA tenderness Pulses 2+ Extremities: no clubbing cyanosis or edema, Homan's sign negative  Neurologic: grossly nonfocal Psychologic: normal affect and mood.  ECG: Sinus rhythm with an occasional PVC; normal intervals.  LABS:  BMET No results found for this basename: na, k, cl, co2, glucose, bun, creatinine, calcium, gfrnonaa, gfraa     Hepatic Function Panel  No results found for this basename: prot, albumin, ast, alt, alkphos, bilitot, bilidir, ibili     CBC    Component Value Date/Time   HGB 15.7 03/02/2008 1239     BNP No results found for this basename: probnp    Lipid Panel  No results found for this basename: chol, trig, hdl, cholhdl, vldl, ldlcalc     RADIOLOGY: No results found.    ASSESSMENT AND PLAN:  [name] continues to do exceptionally well on his current therapy. He is 79 years old and looks significantly younger than his stated age. He is unaware of any palpitations. He was noted to have an occasional PVC on the ECG today. On prolonged auscultation, however, I did not detect any ectopy. I did review the laboratory which had been done by Dr. Eula Listen. He does have a cardiac murmur consistent with at least aortic sclerosis. The echo last year did show some mildly reduced excursion particularly to the noncoronary cusp of his aortic valve. He will continue current therapy as prescribed. I will see him in one year for followup evaluation but am available if problems arise.    Lennette Bihari, MD, [facility]  05/04/2013 9:12 AM

## 2013-05-04 NOTE — Patient Instructions (Signed)
Your physician recommends that you schedule a follow-up appointment in: 1 year. No changes were made today in your therapy. 

## 2013-05-05 ENCOUNTER — Encounter: Payer: Self-pay | Admitting: Cardiovascular Disease

## 2013-06-21 DIAGNOSIS — J209 Acute bronchitis, unspecified: Secondary | ICD-10-CM | POA: Diagnosis not present

## 2013-06-21 DIAGNOSIS — J3489 Other specified disorders of nose and nasal sinuses: Secondary | ICD-10-CM | POA: Diagnosis not present

## 2013-07-06 DIAGNOSIS — H903 Sensorineural hearing loss, bilateral: Secondary | ICD-10-CM | POA: Diagnosis not present

## 2013-08-09 ENCOUNTER — Other Ambulatory Visit: Payer: Self-pay | Admitting: *Deleted

## 2013-08-09 MED ORDER — EZETIMIBE-SIMVASTATIN 10-40 MG PO TABS: 1.0000 | ORAL_TABLET | Freq: Every day | ORAL | Status: DC

## 2013-09-28 DIAGNOSIS — H903 Sensorineural hearing loss, bilateral: Secondary | ICD-10-CM | POA: Diagnosis not present

## 2013-10-20 DIAGNOSIS — M1710 Unilateral primary osteoarthritis, unspecified knee: Secondary | ICD-10-CM | POA: Diagnosis not present

## 2013-11-22 DIAGNOSIS — M1710 Unilateral primary osteoarthritis, unspecified knee: Secondary | ICD-10-CM | POA: Diagnosis not present

## 2013-11-29 DIAGNOSIS — M1710 Unilateral primary osteoarthritis, unspecified knee: Secondary | ICD-10-CM | POA: Diagnosis not present

## 2013-12-06 DIAGNOSIS — M1710 Unilateral primary osteoarthritis, unspecified knee: Secondary | ICD-10-CM | POA: Diagnosis not present

## 2013-12-07 DIAGNOSIS — N139 Obstructive and reflux uropathy, unspecified: Secondary | ICD-10-CM | POA: Diagnosis not present

## 2013-12-07 DIAGNOSIS — N401 Enlarged prostate with lower urinary tract symptoms: Secondary | ICD-10-CM | POA: Diagnosis not present

## 2013-12-07 DIAGNOSIS — N138 Other obstructive and reflux uropathy: Secondary | ICD-10-CM | POA: Diagnosis not present

## 2013-12-27 DIAGNOSIS — Z87442 Personal history of urinary calculi: Secondary | ICD-10-CM | POA: Diagnosis not present

## 2013-12-27 DIAGNOSIS — N139 Obstructive and reflux uropathy, unspecified: Secondary | ICD-10-CM | POA: Diagnosis not present

## 2013-12-27 DIAGNOSIS — N401 Enlarged prostate with lower urinary tract symptoms: Secondary | ICD-10-CM | POA: Diagnosis not present

## 2013-12-27 DIAGNOSIS — N138 Other obstructive and reflux uropathy: Secondary | ICD-10-CM | POA: Diagnosis not present

## 2014-01-20 DIAGNOSIS — M5090 Cervical disc disorder, unspecified, unspecified cervical region: Secondary | ICD-10-CM | POA: Diagnosis not present

## 2014-01-20 DIAGNOSIS — E782 Mixed hyperlipidemia: Secondary | ICD-10-CM | POA: Diagnosis not present

## 2014-01-20 DIAGNOSIS — Z23 Encounter for immunization: Secondary | ICD-10-CM | POA: Diagnosis not present

## 2014-01-20 DIAGNOSIS — Z0000 Encounter for general adult medical examination without abnormal findings: Secondary | ICD-10-CM | POA: Diagnosis not present

## 2014-01-20 DIAGNOSIS — R7309 Other abnormal glucose: Secondary | ICD-10-CM | POA: Diagnosis not present

## 2014-01-20 DIAGNOSIS — N400 Enlarged prostate without lower urinary tract symptoms: Secondary | ICD-10-CM | POA: Diagnosis not present

## 2014-01-20 DIAGNOSIS — Z1331 Encounter for screening for depression: Secondary | ICD-10-CM | POA: Diagnosis not present

## 2014-01-20 DIAGNOSIS — M1710 Unilateral primary osteoarthritis, unspecified knee: Secondary | ICD-10-CM | POA: Diagnosis not present

## 2014-01-20 DIAGNOSIS — D869 Sarcoidosis, unspecified: Secondary | ICD-10-CM | POA: Diagnosis not present

## 2014-02-09 ENCOUNTER — Telehealth: Payer: Self-pay | Admitting: Cardiovascular Disease

## 2014-02-10 NOTE — Telephone Encounter (Signed)
Close encounter 

## 2014-03-02 DIAGNOSIS — L821 Other seborrheic keratosis: Secondary | ICD-10-CM | POA: Diagnosis not present

## 2014-03-02 DIAGNOSIS — Z85828 Personal history of other malignant neoplasm of skin: Secondary | ICD-10-CM | POA: Diagnosis not present

## 2014-03-02 DIAGNOSIS — L570 Actinic keratosis: Secondary | ICD-10-CM | POA: Diagnosis not present

## 2014-03-03 DIAGNOSIS — Z961 Presence of intraocular lens: Secondary | ICD-10-CM | POA: Diagnosis not present

## 2014-03-06 ENCOUNTER — Other Ambulatory Visit: Payer: Self-pay | Admitting: Cardiovascular Disease

## 2014-03-06 NOTE — Telephone Encounter (Signed)
Rx was sent to pharmacy electronically. 

## 2014-03-08 DIAGNOSIS — Z23 Encounter for immunization: Secondary | ICD-10-CM | POA: Diagnosis not present

## 2014-05-04 ENCOUNTER — Encounter: Payer: Self-pay | Admitting: Cardiovascular Disease

## 2014-05-04 ENCOUNTER — Ambulatory Visit (INDEPENDENT_AMBULATORY_CARE_PROVIDER_SITE_OTHER): Payer: Medicare Other | Admitting: Cardiovascular Disease

## 2014-05-04 VITALS — BP 120/70 | HR 66 | Ht 70.0 in | Wt 190.8 lb

## 2014-05-04 DIAGNOSIS — Z79899 Other long term (current) drug therapy: Secondary | ICD-10-CM | POA: Diagnosis not present

## 2014-05-04 DIAGNOSIS — E7849 Other hyperlipidemia: Secondary | ICD-10-CM

## 2014-05-04 DIAGNOSIS — E784 Other hyperlipidemia: Secondary | ICD-10-CM

## 2014-05-04 DIAGNOSIS — E785 Hyperlipidemia, unspecified: Secondary | ICD-10-CM

## 2014-05-04 DIAGNOSIS — I358 Other nonrheumatic aortic valve disorders: Secondary | ICD-10-CM

## 2014-05-04 NOTE — Progress Notes (Signed)
Patient ID: Eugene [name], male   DOB: 08-19-1932, 80 y.o.   MRN: [id number]     HPI: Eugene [name] is a 80 y.o. male who presents to the office today for 1 year cardiology evaluation.  Eugene [name] has a remote history of sarcoid disease and had been followed at [facility] with complete remission. He has a history of mild hyperlipidemia and has been on Vytorin 10/40. He also has a history of BPH on Proscar.  An echo Doppler study which was done to evaluate a systolic murmur in the aortic region noted in December 2013 showed normal systolic function with mild focal basal hypertrophy of the septum. He had normal systolic function. There was evidence for a moderate diffuse calcification involving the noncoronary cusp of a trileaflet aortic valve. He did have sclerosis without stenosis. Valve area was 2.55 cm. RV size was upper normal to mildly dilated. A nuclear perfusion study done in 2013 was unchanged from 6 years previously and continued to show normal perfusion.  Over the past year, Eugene [name] has continued to do well. He remains active. He plays tennis and also rides a elliptical and biking. He denies any exertional shortness of breath. Nice chest pressure. He has been having some difficulty with his right knee.  Laboratory done by Eugene [name] in August was normal with a hemoglobin of 14.5, hematocrit 43.8.  Renal function was excellent with a BUN of 19, Cr 0.99.  Fasting glucose was 78.  Lipid studies remained excellent with a total cholesterol 125, triglycerides 14, HDL cholesterol 44, and LDL cholesterol at 60.  Angiotensin converting enzyme was normal at 37 and last year was 33.  He denies chest pain.  He denies shortness of breath.  He denies palpitations.  Past Medical History  Diagnosis Date  . Hyperlipidemia     History reviewed. No pertinent past surgical history.  No Known Allergies  Current Outpatient Prescriptions  Medication Sig Dispense Refill  .  aspirin 81 MG tablet Take 81 mg by mouth daily.    . finasteride (PROSCAR) 5 MG tablet Take 5 mg by mouth daily.    Marland Kitchen FLUZONE HIGH-DOSE 0.5 ML SUSY   0  . potassium citrate (UROCIT-K) 10 MEQ (1080 MG) SR tablet Take 1 tablet by mouth daily.    Marland Kitchen VYTORIN 10-40 MG per tablet TAKE (1) TABLET DAILY AT BEDTIME. 30 tablet 2   No current facility-administered medications for this visit.    History   Social History  . Marital Status: Married    Spouse Name: N/A    Number of Children: N/A  . Years of Education: N/A   Occupational History  . Not on file.   Social History Main Topics  . Smoking status: Former Smoker    Types: Pipe  . Smokeless tobacco: Never Used  . Alcohol Use: 2.0 oz/week    4 drink(s) per week  . Drug Use: Not on file  . Sexual Activity: Not on file   Other Topics Concern  . Not on file   Social History Narrative   Socially he is married. He is regional and 9 grandchildren. One son lives in [place], or lives in [place]. There is no tobacco use. He does take occasional alcohol. He does exercise.  History reviewed. No pertinent family history.  ROS General: Negative; No fevers, chills, or night sweats;  HEENT: Negative; No changes in vision or hearing, sinus congestion, difficulty swallowing Pulmonary: Negative; No cough, wheezing, shortness of breath, hemoptysis  Cardiovascular: Negative; No chest pain, presyncope, syncope, palpitations GI: Negative; No nausea, vomiting, diarrhea, or abdominal pain GU: Negative; No dysuria, hematuria, or difficulty voiding Musculoskeletal: Negative; no myalgias, joint pain, or weakness Hematologic/Oncology: Negative; no easy bruising, bleeding Remote history of sarcoidosis, completely resolved Endocrine: Negative; no heat/cold intolerance; no diabetes Neuro: Negative; no changes in balance, headaches Skin: Negative; No rashes or skin lesions Psychiatric: Negative; No behavioral problems, depression Sleep: Negative; No  snoring, daytime sleepiness, hypersomnolence, bruxism, restless legs, hypnogognic hallucinations, no cataplexy Other comprehensive 14 point system review is negative.   PE BP 120/70 mmHg  Pulse 66  Ht 5\' 10"  (1.778 m)  Wt 190 lb 12.8 oz (86.546 kg)  BMI 27.38 kg/m2  General: Alert, oriented, no distress.  Skin: normal turgor, no rashes HEENT: Normocephalic, atraumatic. Pupils round and reactive; sclera anicteric;no lid lag.  Nose without nasal septal hypertrophy Mouth/Parynx benign; Mallinpatti scale 2 Neck: No JVD, no carotid briuts Lungs: clear to ausculatation and percussion; no wheezing or rales Chest wall: Nontender to palpation Heart: RRR, s1 s2 normal 2/6 systolic murmur in the  aortic region, no S3.  No diastolic murmur.  No rubs thrills or heaves. Abdomen: soft, nontender; no hepatosplenomehaly, BS+; abdominal aorta nontender and not dilated by palpation. Back: No CVA tenderness Pulses 2+ Extremities: no clubbing cyanosis or edema, Homan's sign negative  Neurologic: grossly nonfocal Psychologic: normal affect and mood.  ECG (independently read by me): Normal sinus rhythm at 66 bpm.  Mild RV conduction delay.  Early transition.  December 2014 ECG: Sinus rhythm with an occasional PVC; normal intervals.  LABS:  BMET No results found for: NA   Hepatic Function Panel  No results found for: PROT   CBC    Component Value Date/Time   HGB 15.7 03/02/2008 1239     BNP No results found for: PROBNP  Lipid Panel  No results found for: CHOL   RADIOLOGY: No results found.    ASSESSMENT AND PLAN:  Eugene [name] is a 80 year old gentleman who has a remote history of prior sarcoid disease with ultimate complete remission.  He has a history of mild hyperlipidemia, as well as aortic valve sclerosis.  His blood pressure today is stable at 120/70.  His weight has not changed in over the year.  I reviewed the blood work done by Eugene [name].  His lipid studies are excellent  and he is tolerating his medications without side effects.  He will continue current therapy as prescribed.  In one year, I will schedule him for three-year follow-up echo Doppler study to reassess his aortic valve as well as LV  systolic and diastolic function.    Troy Sine, MD, [facility]  05/04/2014 9:13 AM

## 2014-05-04 NOTE — Patient Instructions (Signed)
Your physician has requested that you have an echocardiogram. Echocardiography is a painless test that uses sound waves to create images of your heart. It provides your doctor with information about the size and shape of your heart and how well your heart's chambers and valves are working. This procedure takes approximately one hour. There are no restrictions for this procedure. This will be scheduled in December 2016.  Your physician wants you to follow-up in: 1 year or sooner if needed with Dr. Claiborne Billings. You will receive a reminder letter in the mail two months in advance. If you don't receive a letter, please call our office to schedule the follow-up appointment.

## 2014-05-05 ENCOUNTER — Encounter: Payer: Self-pay | Admitting: Cardiovascular Disease

## 2014-05-05 DIAGNOSIS — E785 Hyperlipidemia, unspecified: Secondary | ICD-10-CM | POA: Insufficient documentation

## 2014-05-31 ENCOUNTER — Other Ambulatory Visit: Payer: Self-pay | Admitting: Dermatology

## 2014-05-31 DIAGNOSIS — D485 Neoplasm of uncertain behavior of skin: Secondary | ICD-10-CM | POA: Diagnosis not present

## 2014-05-31 DIAGNOSIS — C44529 Squamous cell carcinoma of skin of other part of trunk: Secondary | ICD-10-CM | POA: Diagnosis not present

## 2014-05-31 DIAGNOSIS — Z85828 Personal history of other malignant neoplasm of skin: Secondary | ICD-10-CM | POA: Diagnosis not present

## 2014-06-09 DIAGNOSIS — M1711 Unilateral primary osteoarthritis, right knee: Secondary | ICD-10-CM | POA: Diagnosis not present

## 2014-07-03 ENCOUNTER — Other Ambulatory Visit: Payer: Self-pay | Admitting: Cardiovascular Disease

## 2014-07-03 NOTE — Telephone Encounter (Signed)
Rx(s) sent to pharmacy electronically.  

## 2014-08-01 ENCOUNTER — Other Ambulatory Visit: Payer: Self-pay | Admitting: Dermatology

## 2014-08-01 DIAGNOSIS — L821 Other seborrheic keratosis: Secondary | ICD-10-CM | POA: Diagnosis not present

## 2014-08-01 DIAGNOSIS — D485 Neoplasm of uncertain behavior of skin: Secondary | ICD-10-CM | POA: Diagnosis not present

## 2014-08-01 DIAGNOSIS — Z85828 Personal history of other malignant neoplasm of skin: Secondary | ICD-10-CM | POA: Diagnosis not present

## 2014-08-01 DIAGNOSIS — B079 Viral wart, unspecified: Secondary | ICD-10-CM | POA: Diagnosis not present

## 2014-08-21 ENCOUNTER — Emergency Department (HOSPITAL_COMMUNITY): Payer: Medicare Other

## 2014-08-21 ENCOUNTER — Encounter (HOSPITAL_COMMUNITY): Payer: Self-pay | Admitting: *Deleted

## 2014-08-21 ENCOUNTER — Emergency Department (HOSPITAL_COMMUNITY)
Admission: EM | Admit: 2014-08-21 | Discharge: 2014-08-22 | Disposition: A | Discharge status: Home / Self Care | Payer: Medicare Other | Attending: Emergency Medicine | Admitting: Emergency Medicine

## 2014-08-21 DIAGNOSIS — Z7982 Long term (current) use of aspirin: Secondary | ICD-10-CM | POA: Insufficient documentation

## 2014-08-21 DIAGNOSIS — E785 Hyperlipidemia, unspecified: Secondary | ICD-10-CM | POA: Diagnosis not present

## 2014-08-21 DIAGNOSIS — R270 Ataxia, unspecified: Secondary | ICD-10-CM | POA: Diagnosis not present

## 2014-08-21 DIAGNOSIS — Z87891 Personal history of nicotine dependence: Secondary | ICD-10-CM | POA: Diagnosis not present

## 2014-08-21 DIAGNOSIS — G9389 Other specified disorders of brain: Secondary | ICD-10-CM | POA: Diagnosis not present

## 2014-08-21 DIAGNOSIS — R42 Dizziness and giddiness: Secondary | ICD-10-CM | POA: Insufficient documentation

## 2014-08-21 DIAGNOSIS — R269 Unspecified abnormalities of gait and mobility: Secondary | ICD-10-CM | POA: Insufficient documentation

## 2014-08-21 DIAGNOSIS — Z79899 Other long term (current) drug therapy: Secondary | ICD-10-CM | POA: Insufficient documentation

## 2014-08-21 DIAGNOSIS — R112 Nausea with vomiting, unspecified: Secondary | ICD-10-CM | POA: Diagnosis not present

## 2014-08-21 DIAGNOSIS — R4182 Altered mental status, unspecified: Secondary | ICD-10-CM | POA: Diagnosis not present

## 2014-08-21 LAB — I-STAT CHEM 8, ED
BUN: 18 mg/dL (ref 6–23)
CALCIUM ION: 1.29 mmol/L (ref 1.13–1.30)
CHLORIDE: 104 mmol/L (ref 96–112)
Creatinine, Ser: 0.9 mg/dL (ref 0.50–1.35)
GLUCOSE: 92 mg/dL (ref 70–99)
HCT: 47 % (ref 39.0–52.0)
HEMOGLOBIN: 16 g/dL (ref 13.0–17.0)
Potassium: 4.1 mmol/L (ref 3.5–5.1)
Sodium: 142 mmol/L (ref 135–145)
TCO2: 22 mmol/L (ref 0–100)

## 2014-08-21 LAB — COMPREHENSIVE METABOLIC PANEL
ALK PHOS: 73 U/L (ref 39–117)
ALT: 32 U/L (ref 0–53)
AST: 32 U/L (ref 0–37)
Albumin: 4.1 g/dL (ref 3.5–5.2)
Anion gap: 6 (ref 5–15)
BUN: 16 mg/dL (ref 6–23)
CALCIUM: 10 mg/dL (ref 8.4–10.5)
CO2: 27 mmol/L (ref 19–32)
Chloride: 107 mmol/L (ref 96–112)
Creatinine, Ser: 0.9 mg/dL (ref 0.50–1.35)
GFR calc Af Amer: >90 mL/min (ref >90)
GFR calc non Af Amer: 79 mL/min — ABNORMAL LOW (ref >90)
GLUCOSE: 86 mg/dL (ref 70–99)
POTASSIUM: 4.2 mmol/L (ref 3.5–5.1)
Sodium: 140 mmol/L (ref 135–145)
Total Bilirubin: 0.7 mg/dL (ref 0.3–1.2)
Total Protein: 7 g/dL (ref 6.0–8.3)

## 2014-08-21 LAB — URINALYSIS, ROUTINE W REFLEX MICROSCOPIC
BILIRUBIN URINE: NEGATIVE
Glucose, UA: NEGATIVE mg/dL
Hgb urine dipstick: NEGATIVE
KETONES UR: NEGATIVE mg/dL
Leukocytes, UA: NEGATIVE
NITRITE: NEGATIVE
PH: 5 (ref 5.0–8.0)
Protein, ur: NEGATIVE mg/dL
Specific Gravity, Urine: 1.02 (ref 1.005–1.030)
Urobilinogen, UA: 0.2 mg/dL (ref 0.0–1.0)

## 2014-08-21 LAB — CBC
HCT: 44.3 % (ref 39.0–52.0)
HEMOGLOBIN: 14.9 g/dL (ref 13.0–17.0)
MCH: 30.3 pg (ref 26.0–34.0)
MCHC: 33.6 g/dL (ref 30.0–36.0)
MCV: 90.2 fL (ref 78.0–100.0)
Platelets: 194 10*3/uL (ref 150–400)
RBC: 4.91 MIL/uL (ref 4.22–5.81)
RDW: 14.3 % (ref 11.5–15.5)
WBC: 8.1 10*3/uL (ref 4.0–10.5)

## 2014-08-21 LAB — RAPID URINE DRUG SCREEN, HOSP PERFORMED
AMPHETAMINES: NOT DETECTED
BENZODIAZEPINES: NOT DETECTED
Barbiturates: NOT DETECTED
COCAINE: NOT DETECTED
OPIATES: NOT DETECTED
TETRAHYDROCANNABINOL: NOT DETECTED

## 2014-08-21 LAB — DIFFERENTIAL
BASOS ABS: 0 10*3/uL (ref 0.0–0.1)
Basophils Relative: 0 % (ref 0–1)
EOS ABS: 0.2 10*3/uL (ref 0.0–0.7)
Eosinophils Relative: 2 % (ref 0–5)
LYMPHS ABS: 2.7 10*3/uL (ref 0.7–4.0)
LYMPHS PCT: 33 % (ref 12–46)
MONO ABS: 0.7 10*3/uL (ref 0.1–1.0)
MONOS PCT: 8 % (ref 3–12)
Neutro Abs: 4.5 10*3/uL (ref 1.7–7.7)
Neutrophils Relative %: 57 % (ref 43–77)

## 2014-08-21 LAB — PROTIME-INR
INR: 1.03 (ref 0.00–1.49)
Prothrombin Time: 13.6 seconds (ref 11.6–15.2)

## 2014-08-21 LAB — ETHANOL: Alcohol, Ethyl (B): <5 mg/dL (ref 0–9)

## 2014-08-21 LAB — I-STAT TROPONIN, ED: Troponin i, poc: 0 ng/mL (ref 0.00–0.08)

## 2014-08-21 LAB — APTT: APTT: 35 s (ref 24–37)

## 2014-08-21 NOTE — ED Notes (Signed)
Pt in with family c/o dizziness and ataxia since yesterday, states it has been constant since that time, history of vertigo and states the dizziness feels like that, but the gait problem is new, sent over from [facility] urgent care for r/o stroke, denies focal weakness or other symptoms, alert and oriented

## 2014-08-21 NOTE — ED Provider Notes (Signed)
CSN: [id number]     Arrival date & time 08/21/14  2010 History   First MD Initiated Contact with Patient 08/21/14 2156     Chief Complaint  Patient presents with  . Gait Problem  . Dizziness     (Consider location/radiation/quality/duration/timing/severity/associated sxs/prior Treatment) The history is provided by the patient. No language interpreter was used.  Eugene [name] is a 81 year old male with past medical history of hyperlipidemia presenting to the ED with dizziness started yesterday afternoon. Patient reports he feels as if the room is spinning around him. Patient reports that the dizziness is worse when going from a sitting to standing position, standing up, laying flat. Patient reports that the dizziness has gotten better since yesterday, but continues. Patient reported that today the dizziness increased with walking. Reports that he feels off balance and unsteady with walking which is something new. Patient reported that he did have history of vertigo diagnosed in April 2015-reported that he did experience dizziness, but denied experiencing issues with gait. Patient reported that he has been experiencing nausea with one episode of emesis today-mainly food contents, NB/NB. Reported that he's been using meclizine, reported that he had 2 tablets yesterday into today. Patient reports he takes aspirin 81 mg daily. Denied fall, head injury, back pain, numbness, tingling, speech issues, unilateral weakness, blurred vision, sudden loss of vision, neck pain, neck stiffness, fever, chills, ear pain, ear infection, cough, nasal congestion, sinus pressure, chest pain, shortness of breath, difficulty breathing, abdominal pain, weakness and heaviness to lower extremities. PCP Dr. Lysle Rubens   Past Medical History  Diagnosis Date  . Hyperlipidemia    History reviewed. No pertinent past surgical history. History reviewed. No pertinent family history. History  Substance Use Topics  . Smoking status:  Former Smoker    Types: Pipe  . Smokeless tobacco: Never Used  . Alcohol Use: 2.0 oz/week    4 drink(s) per week    Review of Systems  Constitutional: Negative for fever and chills.  Eyes: Negative for visual disturbance.  Respiratory: Negative for chest tightness and shortness of breath.   Cardiovascular: Negative for chest pain.  Gastrointestinal: Positive for nausea and vomiting. Negative for abdominal pain.  Musculoskeletal: Negative for back pain, neck pain and neck stiffness.  Neurological: Positive for dizziness. Negative for weakness, numbness and headaches.      Allergies  Review of patient's allergies indicates no known allergies.  Home Medications   Prior to Admission medications   Medication Sig Start Date End Date Taking? Authorizing Provider  aspirin 81 MG tablet Take 81 mg by mouth daily.   Yes Historical Provider, MD  finasteride (PROSCAR) 5 MG tablet Take 5 mg by mouth daily.   Yes Historical Provider, MD  meclizine (ANTIVERT) 25 MG tablet Take 25 mg by mouth 3 (three) times daily as needed for dizziness.   Yes Historical Provider, MD  potassium citrate (UROCIT-K) 10 MEQ (1080 MG) SR tablet Take 1 tablet by mouth daily. 03/07/13  Yes Historical Provider, MD  VYTORIN 10-40 MG per tablet TAKE (1) TABLET DAILY AT BEDTIME. 07/03/14  Yes Troy Sine, MD   BP 131/54 mmHg  Pulse 61  Temp(Src) 98.4 F (36.9 C) (Oral)  Resp 19  SpO2 95% Physical Exam  Constitutional: He is oriented to person, place, and time. He appears well-developed and well-nourished. No distress.  HENT:  Head: Normocephalic and atraumatic.  Mouth/Throat: Oropharynx is clear and moist. No oropharyngeal exudate.  Eyes: Conjunctivae and EOM are normal. Pupils are equal, round,  and reactive to light. Right eye exhibits no discharge. Left eye exhibits no discharge.  Neck: Normal range of motion. Neck supple. No tracheal deviation present.  Cardiovascular: Normal rate, regular rhythm and normal  heart sounds.   Pulses:      Radial pulses are 2+ on the right side, and 2+ on the left side.       Dorsalis pedis pulses are 2+ on the right side, and 2+ on the left side.  Pulmonary/Chest: Effort normal and breath sounds normal. No respiratory distress. He has no wheezes. He has no rales.  Musculoskeletal: Normal range of motion.  Full ROM to upper and lower extremities without difficulty noted, negative ataxia noted.  Lymphadenopathy:    He has no cervical adenopathy.  Neurological: He is alert and oriented to person, place, and time. No cranial nerve deficit. He exhibits normal muscle tone. Coordination normal.  Cranial nerves III-XII grossly intact Strength 5+/5+ to upper and lower extremities bilaterally with resistance applied, equal distribution noted Patient is able to bring finger to nose bilaterally without difficulty or ataxia Grip strength bilaterally Negative facial droop Negative slurred speech Negative aphasia Negative saddle paresthesias bilaterally Negative arm drift Patient follows commands well  Patient responds to questions appropriately  Ataxic gait identified, mild hesitation noted with right lower extremity  Skin: Skin is warm and dry. No rash noted. He is not diaphoretic. No erythema.  Psychiatric: He has a normal mood and affect. His behavior is normal. Thought content normal.  Nursing note and vitals reviewed.   ED Course  Procedures (including critical care time)  Results for orders placed or performed during the hospital encounter of 08/21/14  Ethanol  Result Value Ref Range   Alcohol, Ethyl (B) <5 0 - 9 mg/dL  Protime-INR  Result Value Ref Range   Prothrombin Time 13.6 11.6 - 15.2 seconds   INR 1.03 0.00 - 1.49  APTT  Result Value Ref Range   aPTT 35 24 - 37 seconds  CBC  Result Value Ref Range   WBC 8.1 4.0 - 10.5 K/uL   RBC 4.91 4.22 - 5.81 MIL/uL   Hemoglobin 14.9 13.0 - 17.0 g/dL   HCT 44.3 39.0 - 52.0 %   MCV 90.2 78.0 - 100.0 fL    MCH 30.3 26.0 - 34.0 pg   MCHC 33.6 30.0 - 36.0 g/dL   RDW 14.3 11.5 - 15.5 %   Platelets 194 150 - 400 K/uL  Differential  Result Value Ref Range   Neutrophils Relative % 57 43 - 77 %   Neutro Abs 4.5 1.7 - 7.7 K/uL   Lymphocytes Relative 33 12 - 46 %   Lymphs Abs 2.7 0.7 - 4.0 K/uL   Monocytes Relative 8 3 - 12 %   Monocytes Absolute 0.7 0.1 - 1.0 K/uL   Eosinophils Relative 2 0 - 5 %   Eosinophils Absolute 0.2 0.0 - 0.7 K/uL   Basophils Relative 0 0 - 1 %   Basophils Absolute 0.0 0.0 - 0.1 K/uL  Comprehensive metabolic panel  Result Value Ref Range   Sodium 140 135 - 145 mmol/L   Potassium 4.2 3.5 - 5.1 mmol/L   Chloride 107 96 - 112 mmol/L   CO2 27 19 - 32 mmol/L   Glucose, Bld 86 70 - 99 mg/dL   BUN 16 6 - 23 mg/dL   Creatinine, Ser 0.90 0.50 - 1.35 mg/dL   Calcium 10.0 8.4 - 10.5 mg/dL   Total Protein 7.0 6.0 -  8.3 g/dL   Albumin 4.1 3.5 - 5.2 g/dL   AST 32 0 - 37 U/L   ALT 32 0 - 53 U/L   Alkaline Phosphatase 73 39 - 117 U/L   Total Bilirubin 0.7 0.3 - 1.2 mg/dL   GFR calc non Af Amer 79 (L) >90 mL/min   GFR calc Af Amer >90 >90 mL/min   Anion gap 6 5 - 15  Urine Drug Screen  Result Value Ref Range   Opiates NONE DETECTED NONE DETECTED   Cocaine NONE DETECTED NONE DETECTED   Benzodiazepines NONE DETECTED NONE DETECTED   Amphetamines NONE DETECTED NONE DETECTED   Tetrahydrocannabinol NONE DETECTED NONE DETECTED   Barbiturates NONE DETECTED NONE DETECTED  Urinalysis, Routine w reflex microscopic  Result Value Ref Range   Color, Urine YELLOW YELLOW   APPearance CLEAR CLEAR   Specific Gravity, Urine 1.020 1.005 - 1.030   pH 5.0 5.0 - 8.0   Glucose, UA NEGATIVE NEGATIVE mg/dL   Hgb urine dipstick NEGATIVE NEGATIVE   Bilirubin Urine NEGATIVE NEGATIVE   Ketones, ur NEGATIVE NEGATIVE mg/dL   Protein, ur NEGATIVE NEGATIVE mg/dL   Urobilinogen, UA 0.2 0.0 - 1.0 mg/dL   Nitrite NEGATIVE NEGATIVE   Leukocytes, UA NEGATIVE NEGATIVE  I-Stat Chem 8, ED  Result Value  Ref Range   Sodium 142 135 - 145 mmol/L   Potassium 4.1 3.5 - 5.1 mmol/L   Chloride 104 96 - 112 mmol/L   BUN 18 6 - 23 mg/dL   Creatinine, Ser 0.90 0.50 - 1.35 mg/dL   Glucose, Bld 92 70 - 99 mg/dL   Calcium, Ion 1.29 1.13 - 1.30 mmol/L   TCO2 22 0 - 100 mmol/L   Hemoglobin 16.0 13.0 - 17.0 g/dL   HCT 47.0 39.0 - 52.0 %  I-Stat Troponin, ED (not at [facility])  Result Value Ref Range   Troponin i, poc 0.00 0.00 - 0.08 ng/mL   Comment 3            Labs Review Labs Reviewed  COMPREHENSIVE METABOLIC PANEL - Abnormal; Notable for the following:    GFR calc non Af Amer 79 (*)    All other components within normal limits  ETHANOL  PROTIME-INR  APTT  CBC  DIFFERENTIAL  URINE RAPID DRUG SCREEN (HOSP PERFORMED)  URINALYSIS, ROUTINE W REFLEX MICROSCOPIC  I-STAT CHEM 8, ED  I-STAT TROPOININ, ED    Imaging Review Ct Head Wo Contrast  08/21/2014   CLINICAL DATA:  Gait disturbance, dizziness, ataxia. Altered mental status.  EXAM: CT HEAD WITHOUT CONTRAST  TECHNIQUE: Contiguous axial images were obtained from the base of the skull through the vertex without contrast.  COMPARISON:  04/23/2011  FINDINGS: Diffuse brain atrophy and chronic periventricular white matter microvascular ischemic changes. Mild associated ventricular enlargement. No acute intracranial hemorrhage, mass lesion, definite infarction, midline shift, herniation, hydrocephalus, or extra-axial fluid collection. No focal mass effect or edema. Cisterns are patent. Cerebellar atrophy as well. Orbits are symmetric. Mastoids and sinuses are clear. Hearing devices in the external auditory canals bilaterally.  IMPRESSION: No acute intracranial finding.  Brain atrophy and chronic white matter microvascular ischemic change.   Electronically Signed   By: Jerilynn Mages.  Shick M.D.   On: 08/21/2014 22:42   Mr Brain Wo Contrast (neuro Protocol)  08/22/2014   CLINICAL DATA:  Dizziness and ataxia beginning yesterday. History of vertigo, gait abnormality is  acute.  EXAM: MRI HEAD WITHOUT CONTRAST  TECHNIQUE: Multiplanar, multiecho pulse sequences of the brain and  surrounding structures were obtained without intravenous contrast.  COMPARISON:  CT of the head August 21, 2014 at 2231 hours and MRI of the brain April 23, 2011  FINDINGS: The ventricles and sulci are normal for patient's age. No abnormal parenchymal signal, mass lesions, mass effect. No reduced diffusion to suggest acute ischemia. No susceptibility artifact to suggest hemorrhage. Mild white matter changes. Minimal bilateral occipital encephalomalacia, unchanged.  No abnormal extra-axial fluid collections. No extra-axial masses though, contrast enhanced sequences would be more sensitive. Normal major intracranial vascular flow voids seen at the skull base.  Ocular globes and orbital contents are normal though not tailored for evaluation, status post bilateral ocular lens implants. No abnormal sellar expansion. Trace ethmoid mucosal thickening without paranasal sinus air-fluid levels. Minimal LEFT mastoid effusion. No suspicious calvarial bone marrow signal. No abnormal sellar expansion. Craniocervical junction maintained.  IMPRESSION: No acute intracranial process.  Stable appearance of the head from April 23, 2011: Involutional changes. Mild white matter changes suggest chronic small vessel ischemic disease. Minimal bilateral occipital encephalomalacia may be posttraumatic or ischemic.   Electronically Signed   By: Elon Alas   On: 08/22/2014 01:39     EKG Interpretation None       2:39 AM Dr. Everlene Balls at bedside assessing patient. Labs and imaging reviewed by attending physician agreed to plan of discharge.  MDM   Final diagnoses:  Vertigo    Medications - No data to display  Filed Vitals:   08/22/14 0000 08/22/14 0111 08/22/14 0112 08/22/14 0244  BP: 140/74 142/59 142/59 131/54  Pulse: 61 60  61  Temp:  98.4 F (36.9 C)    TempSrc:  Oral    Resp: 20 15  19   SpO2: 98%  96%  95%   CBC negative elevated leukocytosis. Hemoglobin 14.9, hematocrit 44.3. CMP unremarkable. PT/INR within normal limits. Ethanol negative elevation. Urinalysis unremarkable-negative findings of infection or pyelonephritis. UDS unremarkable. CT head no acute intracranial abnormalities. Brain atrophy and chronic white matter microvascular ischemic changes identified. MRI of brain without contrast no acute intracranial processes identified. Negative findings of UTI pyelonephritis. MRI of brain negative for acute processes. Negative findings of stroke. Suspicion to be vertigo. Negative focal neurological deficits. Patient seen and assessed by attending physician, Dr. Stark Jock, who recommended patient to be discharged home. Patient stable, afebrile. Patient not septic appearing. Discharged patient. Referred patient to PCP and neurology. Discussed with patient to rest and stay hydrated. Discussed with patient states meclizine as prescribed. Discussed with patient to closely monitor symptoms and if symptoms are to worsen or change to report back to the ED - strict return instructions given.  Patient agreed to plan of care, understood, all questions answered.    Jamse Mead, PA-C 08/22/14 0310  Jamse Mead, PA-C 08/25/14 1707  Veryl Speak, MD 08/26/14 [phone]

## 2014-08-21 NOTE — ED Notes (Signed)
Pt transported to CT ?

## 2014-08-22 ENCOUNTER — Emergency Department (HOSPITAL_COMMUNITY): Payer: Medicare Other

## 2014-08-22 DIAGNOSIS — R42 Dizziness and giddiness: Secondary | ICD-10-CM | POA: Diagnosis not present

## 2014-08-22 DIAGNOSIS — G9389 Other specified disorders of brain: Secondary | ICD-10-CM | POA: Diagnosis not present

## 2014-08-22 NOTE — Discharge Instructions (Signed)
Please call your doctor for a followup appointment within 24-48 hours. When you talk to your doctor please let them know that you were seen in the emergency department and have them acquire all of your records so that they can discuss the findings with you and formulate a treatment plan to fully care for your new and ongoing problems. Please call and set-up an appointment with your primary care provider  Please follow up with Neurology Please rest and stay hydrated Please avoid any physical or strenuous activity  Please do not participate in any fast motions Please continue to monitor symptoms closely and if symptoms are to worsen or change (fever greater than 101, chills, sweating, nausea, vomiting, chest pain, shortness of breathe, difficulty breathing, weakness, numbness, tingling, worsening or changes to pain pattern, fall, injury, neck pain, neck stiffness, visual changes, loss of sensation, back pain, inability to control urine or bowel movements, speech difficulty, behavior or activity changes, personality changes) please report back to the Emergency Department immediately.   Vertigo Vertigo means you feel like you or your surroundings are moving when they are not. Vertigo can be dangerous if it occurs when you are at work, driving, or performing difficult activities.  CAUSES  Vertigo occurs when there is a conflict of signals sent to your brain from the visual and sensory systems in your body. There are many different causes of vertigo, including:  Infections, especially in the inner ear.  A bad reaction to a drug or misuse of alcohol and medicines.  Withdrawal from drugs or alcohol.  Rapidly changing positions, such as lying down or rolling over in bed.  A migraine headache.  Decreased blood flow to the brain.  Increased pressure in the brain from a head injury, infection, tumor, or bleeding. SYMPTOMS  You may feel as though the world is spinning around or you are falling to the  ground. Because your balance is upset, vertigo can cause nausea and vomiting. You may have involuntary eye movements (nystagmus). DIAGNOSIS  Vertigo is usually diagnosed by physical exam. If the cause of your vertigo is unknown, your caregiver may perform imaging tests, such as an MRI scan (magnetic resonance imaging). TREATMENT  Most cases of vertigo resolve on their own, without treatment. Depending on the cause, your caregiver may prescribe certain medicines. If your vertigo is related to body position issues, your caregiver may recommend movements or procedures to correct the problem. In rare cases, if your vertigo is caused by certain inner ear problems, you may need surgery. HOME CARE INSTRUCTIONS   Follow your caregiver's instructions.  Avoid driving.  Avoid operating heavy machinery.  Avoid performing any tasks that would be dangerous to you or others during a vertigo episode.  Tell your caregiver if you notice that certain medicines seem to be causing your vertigo. Some of the medicines used to treat vertigo episodes can actually make them worse in some people. SEEK IMMEDIATE MEDICAL CARE IF:   Your medicines do not relieve your vertigo or are making it worse.  You develop problems with talking, walking, weakness, or using your arms, hands, or legs.  You develop severe headaches.  Your nausea or vomiting continues or gets worse.  You develop visual changes.  A family member notices behavioral changes.  Your condition gets worse. MAKE SURE YOU:  Understand these instructions.  Will watch your condition.  Will get help right away if you are not doing well or get worse. Document Released: 02/19/2005 Document Revised: 08/04/2011 Document Reviewed: 11/28/2010  Exit[employer] Patient Information 2015 [employer]. This information is not intended to replace advice given to you by your health care provider. Make sure you discuss any questions you have with your health care  provider.

## 2014-08-22 NOTE — ED Notes (Signed)
Pt transported to MRI 

## 2014-08-22 NOTE — ED Notes (Signed)
Pt verbalized understanding of d/c instructions and has no further questions.  

## 2014-08-23 DIAGNOSIS — K635 Polyp of colon: Secondary | ICD-10-CM | POA: Diagnosis not present

## 2014-08-23 DIAGNOSIS — R42 Dizziness and giddiness: Secondary | ICD-10-CM | POA: Diagnosis not present

## 2014-08-23 DIAGNOSIS — M5090 Cervical disc disorder, unspecified, unspecified cervical region: Secondary | ICD-10-CM | POA: Diagnosis not present

## 2014-08-23 DIAGNOSIS — Z1389 Encounter for screening for other disorder: Secondary | ICD-10-CM | POA: Diagnosis not present

## 2014-08-23 DIAGNOSIS — Z6829 Body mass index (BMI) 29.0-29.9, adult: Secondary | ICD-10-CM | POA: Diagnosis not present

## 2014-08-23 DIAGNOSIS — E785 Hyperlipidemia, unspecified: Secondary | ICD-10-CM | POA: Diagnosis not present

## 2014-08-23 DIAGNOSIS — I10 Essential (primary) hypertension: Secondary | ICD-10-CM | POA: Diagnosis not present

## 2014-08-23 DIAGNOSIS — R972 Elevated prostate specific antigen [PSA]: Secondary | ICD-10-CM | POA: Diagnosis not present

## 2014-08-23 DIAGNOSIS — N400 Enlarged prostate without lower urinary tract symptoms: Secondary | ICD-10-CM | POA: Diagnosis not present

## 2014-08-23 DIAGNOSIS — D869 Sarcoidosis, unspecified: Secondary | ICD-10-CM | POA: Diagnosis not present

## 2014-11-02 DIAGNOSIS — Z86010 Personal history of colonic polyps: Secondary | ICD-10-CM | POA: Diagnosis not present

## 2014-11-02 DIAGNOSIS — D120 Benign neoplasm of cecum: Secondary | ICD-10-CM | POA: Diagnosis not present

## 2014-11-14 DIAGNOSIS — J189 Pneumonia, unspecified organism: Secondary | ICD-10-CM | POA: Diagnosis not present

## 2014-11-14 DIAGNOSIS — R05 Cough: Secondary | ICD-10-CM | POA: Diagnosis not present

## 2014-12-05 DIAGNOSIS — M1711 Unilateral primary osteoarthritis, right knee: Secondary | ICD-10-CM | POA: Diagnosis not present

## 2014-12-08 DIAGNOSIS — H903 Sensorineural hearing loss, bilateral: Secondary | ICD-10-CM | POA: Diagnosis not present

## 2014-12-14 DIAGNOSIS — M1711 Unilateral primary osteoarthritis, right knee: Secondary | ICD-10-CM | POA: Diagnosis not present

## 2014-12-21 ENCOUNTER — Telehealth: Payer: Self-pay | Admitting: *Deleted

## 2014-12-21 NOTE — Telephone Encounter (Signed)
[place] for suregery; hold ASA for 5-7 days

## 2014-12-21 NOTE — Telephone Encounter (Signed)
Requesting surgical clearance:   1. Type of surgery: Right Total Knee Replacement  2. Surgeon: Dr Elsie Saas  3. Surgical date: Pending  4. Medications that need to be help: Aspirin  How long can patient hold ASA for?

## 2014-12-21 NOTE — Telephone Encounter (Signed)
Clearance was send via Epic

## 2015-01-04 DIAGNOSIS — N401 Enlarged prostate with lower urinary tract symptoms: Secondary | ICD-10-CM | POA: Diagnosis not present

## 2015-01-16 DIAGNOSIS — N401 Enlarged prostate with lower urinary tract symptoms: Secondary | ICD-10-CM | POA: Diagnosis not present

## 2015-01-16 DIAGNOSIS — N138 Other obstructive and reflux uropathy: Secondary | ICD-10-CM | POA: Diagnosis not present

## 2015-01-16 DIAGNOSIS — M1711 Unilateral primary osteoarthritis, right knee: Secondary | ICD-10-CM | POA: Diagnosis not present

## 2015-01-16 DIAGNOSIS — Z87442 Personal history of urinary calculi: Secondary | ICD-10-CM | POA: Diagnosis not present

## 2015-01-25 ENCOUNTER — Telehealth: Payer: Self-pay | Admitting: *Deleted

## 2015-01-25 NOTE — Telephone Encounter (Signed)
Signed clearance form by Dr Claiborne Billings was faxed back to [facility] for Right Total Knee Replacement, as per Dr Claiborne Billings patient is ok for surgery.

## 2015-02-16 DIAGNOSIS — D869 Sarcoidosis, unspecified: Secondary | ICD-10-CM | POA: Diagnosis not present

## 2015-02-16 DIAGNOSIS — Z125 Encounter for screening for malignant neoplasm of prostate: Secondary | ICD-10-CM | POA: Diagnosis not present

## 2015-02-16 DIAGNOSIS — E785 Hyperlipidemia, unspecified: Secondary | ICD-10-CM | POA: Diagnosis not present

## 2015-02-16 DIAGNOSIS — I10 Essential (primary) hypertension: Secondary | ICD-10-CM | POA: Diagnosis not present

## 2015-02-23 DIAGNOSIS — Z0000 Encounter for general adult medical examination without abnormal findings: Secondary | ICD-10-CM | POA: Diagnosis not present

## 2015-02-23 DIAGNOSIS — E785 Hyperlipidemia, unspecified: Secondary | ICD-10-CM | POA: Diagnosis not present

## 2015-02-23 DIAGNOSIS — Z23 Encounter for immunization: Secondary | ICD-10-CM | POA: Diagnosis not present

## 2015-02-23 DIAGNOSIS — Z01818 Encounter for other preprocedural examination: Secondary | ICD-10-CM | POA: Diagnosis not present

## 2015-02-23 DIAGNOSIS — I700 Atherosclerosis of aorta: Secondary | ICD-10-CM | POA: Diagnosis not present

## 2015-02-23 DIAGNOSIS — N400 Enlarged prostate without lower urinary tract symptoms: Secondary | ICD-10-CM | POA: Diagnosis not present

## 2015-02-23 DIAGNOSIS — Z6830 Body mass index (BMI) 30.0-30.9, adult: Secondary | ICD-10-CM | POA: Diagnosis not present

## 2015-02-23 DIAGNOSIS — K635 Polyp of colon: Secondary | ICD-10-CM | POA: Diagnosis not present

## 2015-02-23 DIAGNOSIS — Z1389 Encounter for screening for other disorder: Secondary | ICD-10-CM | POA: Diagnosis not present

## 2015-02-23 DIAGNOSIS — I10 Essential (primary) hypertension: Secondary | ICD-10-CM | POA: Diagnosis not present

## 2015-02-23 DIAGNOSIS — D869 Sarcoidosis, unspecified: Secondary | ICD-10-CM | POA: Diagnosis not present

## 2015-02-23 DIAGNOSIS — M1990 Unspecified osteoarthritis, unspecified site: Secondary | ICD-10-CM | POA: Diagnosis not present

## 2015-03-06 ENCOUNTER — Telehealth: Payer: Self-pay | Admitting: Cardiology

## 2015-03-06 NOTE — Telephone Encounter (Signed)
Received records from [facility] for appointment on 03/15/15 with Kerin Ransom, [place].  Records given to [employer] (medical records) for Luke's schedule on 03/15/15. lp

## 2015-03-13 ENCOUNTER — Other Ambulatory Visit: Payer: Self-pay

## 2015-03-13 ENCOUNTER — Ambulatory Visit (HOSPITAL_COMMUNITY): Payer: Medicare Other | Attending: Cardiovascular Disease

## 2015-03-13 ENCOUNTER — Other Ambulatory Visit: Payer: Self-pay | Admitting: Cardiovascular Disease

## 2015-03-13 DIAGNOSIS — D224 Melanocytic nevi of scalp and neck: Secondary | ICD-10-CM | POA: Diagnosis not present

## 2015-03-13 DIAGNOSIS — I340 Nonrheumatic mitral (valve) insufficiency: Secondary | ICD-10-CM | POA: Diagnosis not present

## 2015-03-13 DIAGNOSIS — L821 Other seborrheic keratosis: Secondary | ICD-10-CM | POA: Diagnosis not present

## 2015-03-13 DIAGNOSIS — I358 Other nonrheumatic aortic valve disorders: Secondary | ICD-10-CM

## 2015-03-13 DIAGNOSIS — I071 Rheumatic tricuspid insufficiency: Secondary | ICD-10-CM | POA: Insufficient documentation

## 2015-03-13 DIAGNOSIS — I351 Nonrheumatic aortic (valve) insufficiency: Secondary | ICD-10-CM | POA: Diagnosis not present

## 2015-03-13 DIAGNOSIS — E7849 Other hyperlipidemia: Secondary | ICD-10-CM

## 2015-03-13 DIAGNOSIS — E785 Hyperlipidemia, unspecified: Secondary | ICD-10-CM | POA: Diagnosis not present

## 2015-03-13 DIAGNOSIS — L570 Actinic keratosis: Secondary | ICD-10-CM | POA: Diagnosis not present

## 2015-03-13 DIAGNOSIS — I5189 Other ill-defined heart diseases: Secondary | ICD-10-CM | POA: Diagnosis not present

## 2015-03-13 DIAGNOSIS — D1801 Hemangioma of skin and subcutaneous tissue: Secondary | ICD-10-CM | POA: Diagnosis not present

## 2015-03-13 DIAGNOSIS — Z85828 Personal history of other malignant neoplasm of skin: Secondary | ICD-10-CM | POA: Diagnosis not present

## 2015-03-13 DIAGNOSIS — I350 Nonrheumatic aortic (valve) stenosis: Secondary | ICD-10-CM | POA: Diagnosis not present

## 2015-03-13 DIAGNOSIS — D225 Melanocytic nevi of trunk: Secondary | ICD-10-CM | POA: Diagnosis not present

## 2015-03-13 DIAGNOSIS — E784 Other hyperlipidemia: Secondary | ICD-10-CM | POA: Diagnosis not present

## 2015-03-15 ENCOUNTER — Ambulatory Visit: Payer: PRIVATE HEALTH INSURANCE | Admitting: Cardiology

## 2015-03-15 ENCOUNTER — Ambulatory Visit (INDEPENDENT_AMBULATORY_CARE_PROVIDER_SITE_OTHER): Payer: Medicare Other | Admitting: Cardiology

## 2015-03-15 ENCOUNTER — Encounter: Payer: Self-pay | Admitting: Cardiology

## 2015-03-15 DIAGNOSIS — I350 Nonrheumatic aortic (valve) stenosis: Secondary | ICD-10-CM

## 2015-03-15 NOTE — Progress Notes (Signed)
Mr Erbes came to the office today to f/u his echo results. There was some confusion about todays appointment and he was upset about being here. He declined having his VS taken or filling out any paperwork. Dr Claiborne Billings had already cleared him for surgery, then according to the pt, his PCP ordered an echo. This was done 03/13/15 and shows mild AS, mild AI, and moderate TR. EF was 60-65%. This appeared to be a slight progression from 2013. I reviewed the echo results with him and reassured him it would not prevent him from having his upcoming surgery. I did not charge him for today's visit. He will keep his previously scheduled appointment with Dr Claiborne Billings in January.   Kerin Ransom PA-C 03/15/2015 3:34 PM

## 2015-03-19 ENCOUNTER — Encounter (HOSPITAL_COMMUNITY): Payer: Self-pay | Admitting: Physician Assistant

## 2015-03-19 DIAGNOSIS — M1711 Unilateral primary osteoarthritis, right knee: Secondary | ICD-10-CM | POA: Diagnosis not present

## 2015-03-19 NOTE — H&P (Signed)
TOTAL KNEE ADMISSION H&P  Patient is being admitted for right total knee arthroplasty.  Subjective:  Chief Complaint:right knee pain.  HPI: Eugene [name], 81 y.o. male, has a history of pain and functional disability in the right knee due to arthritis and has failed non-surgical conservative treatments for greater than 12 weeks to includeNSAID's and/or analgesics, corticosteriod injections, viscosupplementation injections, flexibility and strengthening excercises, supervised PT with diminished ADL's post treatment, use of assistive devices, weight reduction as appropriate and activity modification.  Onset of symptoms was gradual, starting 10 years ago with gradually worsening course since that time. The patient noted no past surgery on the right knee(s).  Patient currently rates pain in the right knee(s) at 10 out of 10 with activity. Patient has night pain, worsening of pain with activity and weight bearing, pain that interferes with activities of daily living, crepitus and joint swelling.  Patient has evidence of subchondral sclerosis, periarticular osteophytes and joint space narrowing by imaging studies.  There is no active infection.  Patient Active Problem List   Diagnosis Date Noted  . Hyperlipidemia 05/05/2014  . Aortic valve stenosis, mild 05/04/2013  . OTH SPEC ALVEOL&PARIETOALVEOL PNEUMONOPATHIES 03/30/2007  . OSTEOARTHRITIS 03/30/2007   Past Medical History  Diagnosis Date  . Hyperlipidemia   . Sarcoidosis ([place])     diagnosed 8-9 yrs ago  . BPH (benign prostatic hyperplasia)     Past Surgical History  Procedure Laterality Date  . Cardiovascular stress test  04/29/2012    normal nuclear stress study.   . Doppler echocardiography  04/29/2012    EF not noted. small perimembranous ventricular septal defect. small left to right ventricular shunt. moderate diffuse calcification involving noncoronary cusp of the aortic valve.     No current facility-administered medications for this  encounter.  Current outpatient prescriptions:  .  aspirin 81 MG tablet, Take 81 mg by mouth daily., Disp: , Rfl:  .  finasteride (PROSCAR) 5 MG tablet, Take 5 mg by mouth daily., Disp: , Rfl:  .  potassium citrate (UROCIT-K) 10 MEQ (1080 MG) SR tablet, Take 1 tablet by mouth daily., Disp: , Rfl:  .  VYTORIN 10-40 MG per tablet, TAKE (1) TABLET DAILY AT BEDTIME., Disp: 30 tablet, Rfl: 9   No Known Allergies  Social History  Substance Use Topics  . Smoking status: Former Smoker    Types: Pipe  . Smokeless tobacco: Never Used  . Alcohol Use: 2.0 oz/week    4 Standard drinks or equivalent per week    Family History  Problem Relation Age of Onset  . Heart attack Father 100  . Ovarian cancer Mother      Review of Systems  Constitutional: Negative.   HENT: Negative.   Eyes: Negative.   Respiratory: Negative.   Cardiovascular: Negative.   Gastrointestinal: Negative.   Genitourinary: Negative.   Musculoskeletal: Positive for joint pain.  Skin: Negative.   Neurological: Negative.   Endo/Heme/Allergies: Negative.   Psychiatric/Behavioral: Negative.     Objective:  Physical Exam  Constitutional: He is oriented to person, place, and time. He appears well-developed and well-nourished.  HENT:  Head: Normocephalic and atraumatic.  Eyes: Conjunctivae are normal. Pupils are equal, round, and reactive to light.  Neck: Neck supple.  Cardiovascular: Normal rate.   Respiratory: Effort normal and breath sounds normal.  GI: Soft. Bowel sounds are normal.  Genitourinary:  Not pertinent to current symptomatology therefore not examined.  Musculoskeletal:  Examination of his right knee reveals pain medial and lateral. There was  moderate varus deformity, 1+ crepitation. There was 1+ synovitis. The range of motion is -5 to 115 degrees. The knee is stable with a normal patellar tracking.  Examination of the left knee reveals a full range of motion without pain, swelling, weakness or  instability. Vascular exam: pulses 2+ and symmetric.    Neurological: He is alert and oriented to person, place, and time.  Skin: Skin is warm and dry.  Psychiatric: He has a normal mood and affect. His behavior is normal.    Vital signs in last 24 hours: Temp:  [97.5 F (36.4 C)] 97.5 F (36.4 C) (10/24 1700) Pulse Rate:  [63] 63 (10/24 1700) BP: (147)/(75) 147/75 mmHg (10/24 1700) SpO2:  [95 %] 95 % (10/24 1700) Weight:  [87.998 kg (194 lb)] 87.998 kg (194 lb) (10/24 1700)  Labs:   Estimated body mass index is 28.64 kg/(m^2) as calculated from the following:   Height as of this encounter: 5\' 9"  (1.753 m).   Weight as of this encounter: 87.998 kg (194 lb).   Imaging Review Plain radiographs demonstrate severe degenerative joint disease of the right knee(s). The overall alignment issignificant varus. The bone quality appears to be good for age and reported activity level.  Assessment/Plan:  End stage arthritis, right knee   The patient history, physical examination, clinical judgment of the provider and imaging studies are consistent with end stage degenerative joint disease of the right knee(s) and total knee arthroplasty is deemed medically necessary. The treatment options including medical management, injection therapy arthroscopy and arthroplasty were discussed at length. The risks and benefits of total knee arthroplasty were presented and reviewed. The risks due to aseptic loosening, infection, stiffness, patella tracking problems, thromboembolic complications and other imponderables were discussed. The patient acknowledged the explanation, agreed to proceed with the plan and consent was signed. Patient is being admitted for inpatient treatment for surgery, pain control, PT, OT, prophylactic antibiotics, VTE prophylaxis, progressive ambulation and ADL's and discharge planning. The patient is planning to be discharged home with home health services   Maycie Luera A. Kaleen Mask Physician Assistant [facility] [phone]  03/19/2015, 5:13 PM

## 2015-03-20 ENCOUNTER — Encounter (HOSPITAL_COMMUNITY)
Admission: RE | Admit: 2015-03-20 | Discharge: 2015-03-20 | Disposition: A | Discharge status: Home / Self Care | Payer: Medicare Other | Source: Ambulatory Visit | Attending: Orthopedic Surgery | Admitting: Orthopedic Surgery

## 2015-03-20 ENCOUNTER — Encounter (HOSPITAL_COMMUNITY): Payer: Self-pay

## 2015-03-20 DIAGNOSIS — R531 Weakness: Secondary | ICD-10-CM | POA: Diagnosis not present

## 2015-03-20 DIAGNOSIS — M179 Osteoarthritis of knee, unspecified: Secondary | ICD-10-CM | POA: Insufficient documentation

## 2015-03-20 DIAGNOSIS — M25561 Pain in right knee: Secondary | ICD-10-CM | POA: Diagnosis not present

## 2015-03-20 DIAGNOSIS — Z0183 Encounter for blood typing: Secondary | ICD-10-CM | POA: Diagnosis not present

## 2015-03-20 DIAGNOSIS — Z01812 Encounter for preprocedural laboratory examination: Secondary | ICD-10-CM | POA: Insufficient documentation

## 2015-03-20 DIAGNOSIS — M1711 Unilateral primary osteoarthritis, right knee: Secondary | ICD-10-CM | POA: Diagnosis not present

## 2015-03-20 HISTORY — DX: Unspecified abdominal hernia without obstruction or gangrene: K46.9

## 2015-03-20 HISTORY — DX: Unspecified right bundle-branch block: I45.10

## 2015-03-20 HISTORY — DX: Personal history of urinary calculi: Z87.442

## 2015-03-20 HISTORY — DX: Calculus in bladder: N21.0

## 2015-03-20 LAB — COMPREHENSIVE METABOLIC PANEL
ALT: 30 U/L (ref 17–63)
AST: 29 U/L (ref 15–41)
Albumin: 4 g/dL (ref 3.5–5.0)
Alkaline Phosphatase: 68 U/L (ref 38–126)
Anion gap: 6 (ref 5–15)
BUN: 21 mg/dL — ABNORMAL HIGH (ref 6–20)
CHLORIDE: 104 mmol/L (ref 101–111)
CO2: 28 mmol/L (ref 22–32)
Calcium: 10.1 mg/dL (ref 8.9–10.3)
Creatinine, Ser: 0.97 mg/dL (ref 0.61–1.24)
Glucose, Bld: 96 mg/dL (ref 65–99)
POTASSIUM: 4.4 mmol/L (ref 3.5–5.1)
Sodium: 138 mmol/L (ref 135–145)
TOTAL PROTEIN: 6.6 g/dL (ref 6.5–8.1)
Total Bilirubin: 0.8 mg/dL (ref 0.3–1.2)

## 2015-03-20 LAB — CBC WITH DIFFERENTIAL/PLATELET
BASOS ABS: 0 10*3/uL (ref 0.0–0.1)
Basophils Relative: 0 %
EOS PCT: 2 %
Eosinophils Absolute: 0.1 10*3/uL (ref 0.0–0.7)
HCT: 44.9 % (ref 39.0–52.0)
Hemoglobin: 14.9 g/dL (ref 13.0–17.0)
LYMPHS ABS: 1.7 10*3/uL (ref 0.7–4.0)
LYMPHS PCT: 22 %
MCH: 29.8 pg (ref 26.0–34.0)
MCHC: 33.2 g/dL (ref 30.0–36.0)
MCV: 89.8 fL (ref 78.0–100.0)
MONO ABS: 0.5 10*3/uL (ref 0.1–1.0)
MONOS PCT: 6 %
Neutro Abs: 5.5 10*3/uL (ref 1.7–7.7)
Neutrophils Relative %: 70 %
PLATELETS: 175 10*3/uL (ref 150–400)
RBC: 5 MIL/uL (ref 4.22–5.81)
RDW: 13.5 % (ref 11.5–15.5)
WBC: 7.9 10*3/uL (ref 4.0–10.5)

## 2015-03-20 LAB — ABO/RH: ABO/RH(D): O POS

## 2015-03-20 LAB — TYPE AND SCREEN
ABO/RH(D): O POS
ANTIBODY SCREEN: NEGATIVE

## 2015-03-20 LAB — SURGICAL PCR SCREEN
MRSA, PCR: NEGATIVE
Staphylococcus aureus: NEGATIVE

## 2015-03-20 LAB — PROTIME-INR
INR: 1.1 (ref 0.00–1.49)
PROTHROMBIN TIME: 14.4 s (ref 11.6–15.2)

## 2015-03-20 LAB — APTT: APTT: 34 s (ref 24–37)

## 2015-03-20 NOTE — Pre-Procedure Instructions (Signed)
Eugene [name]  03/20/2015     Your procedure is scheduled on November 7.  Report to [facility] Admitting at 5:30 A.M.  Call this number if you have problems the morning of surgery:  [phone]   Remember:  Do not eat food or drink liquids after midnight.  Take these medicines the morning of surgery with A SIP OF WATER Finasteride   STOP Aspirin October 31    STOP/ Do not take Aspirin, Aleve, Naproxen, Advil, Ibuprofen, Motrin, Vitamins, Herbs, or Supplements starting October 31   Do not wear jewelry, make-up or nail polish.  Do not wear lotions, powders, or perfumes.  You may wear deodorant.  Do not shave 48 hours prior to surgery.  Men may shave face and neck.  Do not bring valuables to the hospital.  [facility] is not responsible for any belongings or valuables.  Contacts, dentures or bridgework may not be worn into surgery.  Leave your suitcase in the car.  After surgery it may be brought to your room.  For patients admitted to the hospital, discharge time will be determined by your treatment team.  Patients discharged the day of surgery will not be allowed to drive home.   [place] - Preparing for Surgery  Before surgery, you can play an important role.  Because skin is not sterile, your skin needs to be as free of germs as possible.  You can reduce the number of germs on you skin by washing with CHG (chlorahexidine gluconate) soap before surgery.  CHG is an antiseptic cleaner which kills germs and bonds with the skin to continue killing germs even after washing.  Please DO NOT use if you have an allergy to CHG or antibacterial soaps.  If your skin becomes reddened/irritated stop using the CHG and inform your nurse when you arrive at Short Stay.  Do not shave (including legs and underarms) for at least 48 hours prior to the first CHG shower.  You may shave your face.  Please follow these instructions carefully:   1.  Shower with CHG Soap the night  before surgery and the morning of Surgery.  2.  If you choose to wash your hair, wash your hair first as usual with your normal shampoo.  3.  After you shampoo, rinse your hair and body thoroughly to remove the shampoo.  4.  Use CHG as you would any other liquid soap.  You can apply CHG directly to the skin and wash gently with scrungie or a clean washcloth.  5.  Apply the CHG Soap to your body ONLY FROM THE NECK DOWN.  Do not use on open wounds or open sores.  Avoid contact with your eyes, ears, mouth and genitals (private parts).  Wash genitals (private parts) with your normal soap.  6.  Wash thoroughly, paying special attention to the area where your surgery will be performed.  7.  Thoroughly rinse your body with warm water from the neck down.  8.  DO NOT shower/wash with your normal soap after using and rinsing off the CHG Soap.  9.  Pat yourself dry with a clean towel.            10.  Wear clean pajamas.            11.  Place clean sheets on your bed the night of your first shower and do not sleep with pets.  Day of Surgery  Do not apply any lotions the morning  of surgery.  Please wear clean clothes to the hospital/surgery center.   Please read over the following fact sheets that you were given. Pain Booklet, Coughing and Deep Breathing, Blood Transfusion Information, Total Joint Packet and Surgical Site Infection Prevention

## 2015-03-20 NOTE — Progress Notes (Signed)
Patient denies chest pain or shortness of breath. Cardiologist Dr. Claiborne Billings last office visit, clearance, stress test, and echo noted in chart. PCP Dr. Ardeth Perfect at [facility].

## 2015-03-21 LAB — URINE CULTURE: CULTURE: NO GROWTH

## 2015-04-01 MED ORDER — CEFAZOLIN SODIUM-DEXTROSE 2-3 GM-% IV SOLR
2.0000 g | INTRAVENOUS | Status: AC
  Administered 2015-04-02: 2 g via INTRAVENOUS
  Filled 2015-04-01: qty 50

## 2015-04-02 ENCOUNTER — Encounter (HOSPITAL_COMMUNITY)
Admission: RE | Disposition: A | Discharge status: Home Health | Payer: Self-pay | Source: Ambulatory Visit | Attending: Orthopedic Surgery

## 2015-04-02 ENCOUNTER — Inpatient Hospital Stay (HOSPITAL_COMMUNITY)
Admission: RE | Admit: 2015-04-02 | Discharge: 2015-04-06 | DRG: 469 | Disposition: A | Discharge status: Home Health | Payer: Medicare Other | Source: Ambulatory Visit | Attending: Orthopedic Surgery | Admitting: Orthopedic Surgery

## 2015-04-02 ENCOUNTER — Inpatient Hospital Stay (HOSPITAL_COMMUNITY): Payer: Medicare Other | Admitting: Anesthesiology

## 2015-04-02 ENCOUNTER — Encounter (HOSPITAL_COMMUNITY): Payer: Self-pay | Admitting: Anesthesiology

## 2015-04-02 DIAGNOSIS — Z85828 Personal history of other malignant neoplasm of skin: Secondary | ICD-10-CM

## 2015-04-02 DIAGNOSIS — J690 Pneumonitis due to inhalation of food and vomit: Secondary | ICD-10-CM | POA: Diagnosis not present

## 2015-04-02 DIAGNOSIS — Z7982 Long term (current) use of aspirin: Secondary | ICD-10-CM | POA: Diagnosis not present

## 2015-04-02 DIAGNOSIS — Z87891 Personal history of nicotine dependence: Secondary | ICD-10-CM

## 2015-04-02 DIAGNOSIS — Z6828 Body mass index (BMI) 28.0-28.9, adult: Secondary | ICD-10-CM

## 2015-04-02 DIAGNOSIS — K449 Diaphragmatic hernia without obstruction or gangrene: Secondary | ICD-10-CM | POA: Diagnosis present

## 2015-04-02 DIAGNOSIS — K209 Esophagitis, unspecified without bleeding: Secondary | ICD-10-CM | POA: Insufficient documentation

## 2015-04-02 DIAGNOSIS — K2210 Ulcer of esophagus without bleeding: Secondary | ICD-10-CM | POA: Diagnosis present

## 2015-04-02 DIAGNOSIS — J9601 Acute respiratory failure with hypoxia: Secondary | ICD-10-CM | POA: Diagnosis not present

## 2015-04-02 DIAGNOSIS — M1711 Unilateral primary osteoarthritis, right knee: Principal | ICD-10-CM

## 2015-04-02 DIAGNOSIS — Z79899 Other long term (current) drug therapy: Secondary | ICD-10-CM | POA: Diagnosis not present

## 2015-04-02 DIAGNOSIS — J9811 Atelectasis: Secondary | ICD-10-CM | POA: Diagnosis not present

## 2015-04-02 DIAGNOSIS — R918 Other nonspecific abnormal finding of lung field: Secondary | ICD-10-CM | POA: Insufficient documentation

## 2015-04-02 DIAGNOSIS — K922 Gastrointestinal hemorrhage, unspecified: Secondary | ICD-10-CM | POA: Diagnosis not present

## 2015-04-02 DIAGNOSIS — K208 Other esophagitis: Secondary | ICD-10-CM | POA: Diagnosis not present

## 2015-04-02 DIAGNOSIS — D62 Acute posthemorrhagic anemia: Secondary | ICD-10-CM | POA: Diagnosis not present

## 2015-04-02 DIAGNOSIS — M79661 Pain in right lower leg: Secondary | ICD-10-CM

## 2015-04-02 DIAGNOSIS — E785 Hyperlipidemia, unspecified: Secondary | ICD-10-CM | POA: Diagnosis present

## 2015-04-02 DIAGNOSIS — M169 Osteoarthritis of hip, unspecified: Secondary | ICD-10-CM | POA: Diagnosis not present

## 2015-04-02 DIAGNOSIS — K920 Hematemesis: Secondary | ICD-10-CM | POA: Diagnosis not present

## 2015-04-02 DIAGNOSIS — M7989 Other specified soft tissue disorders: Secondary | ICD-10-CM | POA: Diagnosis not present

## 2015-04-02 DIAGNOSIS — R079 Chest pain, unspecified: Secondary | ICD-10-CM | POA: Diagnosis not present

## 2015-04-02 DIAGNOSIS — G8918 Other acute postprocedural pain: Secondary | ICD-10-CM | POA: Diagnosis not present

## 2015-04-02 DIAGNOSIS — D869 Sarcoidosis, unspecified: Secondary | ICD-10-CM | POA: Insufficient documentation

## 2015-04-02 DIAGNOSIS — R05 Cough: Secondary | ICD-10-CM | POA: Diagnosis not present

## 2015-04-02 DIAGNOSIS — I350 Nonrheumatic aortic (valve) stenosis: Secondary | ICD-10-CM | POA: Diagnosis present

## 2015-04-02 DIAGNOSIS — R7981 Abnormal blood-gas level: Secondary | ICD-10-CM | POA: Diagnosis present

## 2015-04-02 DIAGNOSIS — N400 Enlarged prostate without lower urinary tract symptoms: Secondary | ICD-10-CM | POA: Diagnosis present

## 2015-04-02 DIAGNOSIS — M79604 Pain in right leg: Secondary | ICD-10-CM | POA: Diagnosis not present

## 2015-04-02 DIAGNOSIS — M1710 Unilateral primary osteoarthritis, unspecified knee: Secondary | ICD-10-CM | POA: Diagnosis present

## 2015-04-02 DIAGNOSIS — R0902 Hypoxemia: Secondary | ICD-10-CM | POA: Diagnosis not present

## 2015-04-02 DIAGNOSIS — M179 Osteoarthritis of knee, unspecified: Secondary | ICD-10-CM | POA: Diagnosis present

## 2015-04-02 HISTORY — DX: Benign prostatic hyperplasia without lower urinary tract symptoms: N40.0

## 2015-04-02 HISTORY — DX: Unilateral primary osteoarthritis, right knee: M17.11

## 2015-04-02 HISTORY — PX: TOTAL KNEE ARTHROPLASTY: SHX125

## 2015-04-02 HISTORY — DX: Gastrointestinal hemorrhage, unspecified: K92.2

## 2015-04-02 HISTORY — DX: Abnormal blood-gas level: R79.81

## 2015-04-02 LAB — OCCULT BLOOD X 1 CARD TO LAB, STOOL: FECAL OCCULT BLD: POSITIVE — AB

## 2015-04-02 SURGERY — ARTHROPLASTY, KNEE, TOTAL
Anesthesia: Spinal | Site: Knee | Laterality: Right

## 2015-04-02 MED ORDER — CEFAZOLIN SODIUM-DEXTROSE 2-3 GM-% IV SOLR
2.0000 g | Freq: Four times a day (QID) | INTRAVENOUS | Status: AC
Start: 2015-04-02 — End: 2015-04-02
  Administered 2015-04-02 (×2): 2 g via INTRAVENOUS
  Filled 2015-04-02 (×3): qty 50

## 2015-04-02 MED ORDER — BUPIVACAINE-EPINEPHRINE (PF) 0.25% -1:200000 IJ SOLN
INTRAMUSCULAR | Status: AC
  Filled 2015-04-02: qty 30

## 2015-04-02 MED ORDER — OXYCODONE HCL 5 MG PO TABS
ORAL_TABLET | ORAL | Status: AC
  Administered 2015-04-02: 10 mg via ORAL
  Filled 2015-04-02: qty 2

## 2015-04-02 MED ORDER — LIDOCAINE HCL (CARDIAC) 20 MG/ML IV SOLN
INTRAVENOUS | Status: AC
  Filled 2015-04-02: qty 5

## 2015-04-02 MED ORDER — METOCLOPRAMIDE HCL 5 MG/ML IJ SOLN
5.0000 mg | Freq: Three times a day (TID) | INTRAMUSCULAR | Status: DC | PRN
  Administered 2015-04-02: 10 mg via INTRAVENOUS
  Filled 2015-04-02: qty 2

## 2015-04-02 MED ORDER — MENTHOL 3 MG MT LOZG
1.0000 | LOZENGE | OROMUCOSAL | Status: DC | PRN
Start: 2015-04-02 — End: 2015-04-06

## 2015-04-02 MED ORDER — POLYETHYLENE GLYCOL 3350 17 G PO PACK
17.0000 g | PACK | Freq: Two times a day (BID) | ORAL | Status: DC
  Administered 2015-04-02 – 2015-04-05 (×6): 17 g via ORAL
  Filled 2015-04-02 (×7): qty 1

## 2015-04-02 MED ORDER — CHLORHEXIDINE GLUCONATE 4 % EX LIQD: 60.0000 mL | Freq: Once | CUTANEOUS | Status: DC

## 2015-04-02 MED ORDER — ACETAMINOPHEN 650 MG RE SUPP: 650.0000 mg | Freq: Four times a day (QID) | RECTAL | Status: DC | PRN

## 2015-04-02 MED ORDER — PHENOL 1.4 % MT LIQD: 1.0000 | OROMUCOSAL | Status: DC | PRN

## 2015-04-02 MED ORDER — LIDOCAINE-EPINEPHRINE (PF) 1.5 %-1:200000 IJ SOLN
INTRAMUSCULAR | Status: DC | PRN
  Administered 2015-04-02: 15 mL via PERINEURAL

## 2015-04-02 MED ORDER — ONDANSETRON HCL 4 MG PO TABS
4.0000 mg | ORAL_TABLET | Freq: Four times a day (QID) | ORAL | Status: DC | PRN
  Administered 2015-04-03 – 2015-04-04 (×2): 4 mg via ORAL
  Filled 2015-04-02 (×2): qty 1

## 2015-04-02 MED ORDER — PROPOFOL 10 MG/ML IV BOLUS
INTRAVENOUS | Status: AC
  Filled 2015-04-02: qty 20

## 2015-04-02 MED ORDER — DEXAMETHASONE SODIUM PHOSPHATE 10 MG/ML IJ SOLN
INTRAMUSCULAR | Status: DC | PRN
  Administered 2015-04-02: 10 mg via INTRAVENOUS

## 2015-04-02 MED ORDER — DEXAMETHASONE SODIUM PHOSPHATE 10 MG/ML IJ SOLN
INTRAMUSCULAR | Status: AC
  Filled 2015-04-02: qty 1

## 2015-04-02 MED ORDER — ACETAMINOPHEN 325 MG PO TABS
ORAL_TABLET | ORAL | Status: AC
  Filled 2015-04-02: qty 2

## 2015-04-02 MED ORDER — ALUM & MAG HYDROXIDE-SIMETH 200-200-20 MG/5ML PO SUSP: 30.0000 mL | ORAL | Status: DC | PRN

## 2015-04-02 MED ORDER — ONDANSETRON HCL 4 MG/2ML IJ SOLN
4.0000 mg | Freq: Four times a day (QID) | INTRAMUSCULAR | Status: DC | PRN
  Administered 2015-04-02 – 2015-04-04 (×2): 4 mg via INTRAVENOUS
  Filled 2015-04-02 (×2): qty 2

## 2015-04-02 MED ORDER — BUPIVACAINE HCL (PF) 0.5 % IJ SOLN
INTRAMUSCULAR | Status: DC | PRN
  Administered 2015-04-02: 20 mL via PERINEURAL

## 2015-04-02 MED ORDER — POTASSIUM CHLORIDE CRYS ER 20 MEQ PO TBCR
10.0000 meq | EXTENDED_RELEASE_TABLET | Freq: Every day | ORAL | Status: DC
  Administered 2015-04-05 – 2015-04-06 (×2): 10 meq via ORAL
  Filled 2015-04-02 (×3): qty 1

## 2015-04-02 MED ORDER — CELECOXIB 200 MG PO CAPS
200.0000 mg | ORAL_CAPSULE | Freq: Two times a day (BID) | ORAL | Status: DC
  Administered 2015-04-02 (×2): 200 mg via ORAL
  Filled 2015-04-02 (×2): qty 1

## 2015-04-02 MED ORDER — POVIDONE-IODINE 7.5 % EX SOLN
Freq: Once | CUTANEOUS | Status: DC
  Filled 2015-04-02: qty 118

## 2015-04-02 MED ORDER — PROPOFOL 500 MG/50ML IV EMUL
INTRAVENOUS | Status: DC | PRN
  Administered 2015-04-02: 75 ug/kg/min via INTRAVENOUS

## 2015-04-02 MED ORDER — PROMETHAZINE HCL 25 MG/ML IJ SOLN: 6.2500 mg | INTRAMUSCULAR | Status: DC | PRN

## 2015-04-02 MED ORDER — HYDROMORPHONE HCL 1 MG/ML IJ SOLN
1.0000 mg | INTRAMUSCULAR | Status: DC | PRN
  Administered 2015-04-02 (×2): 1 mg via INTRAVENOUS
  Filled 2015-04-02 (×2): qty 1

## 2015-04-02 MED ORDER — ONDANSETRON HCL 4 MG/2ML IJ SOLN
INTRAMUSCULAR | Status: AC
  Filled 2015-04-02: qty 2

## 2015-04-02 MED ORDER — ACETAMINOPHEN 325 MG PO TABS
650.0000 mg | ORAL_TABLET | Freq: Four times a day (QID) | ORAL | Status: DC | PRN
  Administered 2015-04-02 – 2015-04-06 (×2): 650 mg via ORAL
  Filled 2015-04-02: qty 2

## 2015-04-02 MED ORDER — OXYCODONE HCL 5 MG PO TABS
5.0000 mg | ORAL_TABLET | ORAL | Status: DC | PRN
  Administered 2015-04-02 – 2015-04-03 (×4): 10 mg via ORAL
  Administered 2015-04-03: 5 mg via ORAL
  Administered 2015-04-03 – 2015-04-05 (×10): 10 mg via ORAL
  Filled 2015-04-02 (×4): qty 2
  Filled 2015-04-02: qty 1
  Filled 2015-04-02 (×11): qty 2

## 2015-04-02 MED ORDER — POTASSIUM CITRATE ER 10 MEQ (1080 MG) PO TBCR: 10.0000 meq | EXTENDED_RELEASE_TABLET | Freq: Every day | ORAL | Status: DC

## 2015-04-02 MED ORDER — APIXABAN 2.5 MG PO TABS: 2.5000 mg | ORAL_TABLET | Freq: Two times a day (BID) | ORAL | Status: DC

## 2015-04-02 MED ORDER — FINASTERIDE 5 MG PO TABS
5.0000 mg | ORAL_TABLET | Freq: Every evening | ORAL | Status: DC
  Administered 2015-04-02 – 2015-04-05 (×4): 5 mg via ORAL
  Filled 2015-04-02 (×4): qty 1

## 2015-04-02 MED ORDER — EPHEDRINE SULFATE 50 MG/ML IJ SOLN
INTRAMUSCULAR | Status: DC | PRN
  Administered 2015-04-02: 5 mg via INTRAVENOUS
  Administered 2015-04-02 (×2): 10 mg via INTRAVENOUS
  Administered 2015-04-02: 5 mg via INTRAVENOUS

## 2015-04-02 MED ORDER — DEXAMETHASONE SODIUM PHOSPHATE 4 MG/ML IJ SOLN
INTRAMUSCULAR | Status: AC
  Filled 2015-04-02: qty 2

## 2015-04-02 MED ORDER — METOCLOPRAMIDE HCL 5 MG PO TABS
5.0000 mg | ORAL_TABLET | Freq: Three times a day (TID) | ORAL | Status: DC | PRN
  Administered 2015-04-04: 10 mg via ORAL
  Filled 2015-04-02: qty 2

## 2015-04-02 MED ORDER — ONDANSETRON HCL 4 MG/2ML IJ SOLN
INTRAMUSCULAR | Status: DC | PRN
  Administered 2015-04-02: 4 mg via INTRAVENOUS

## 2015-04-02 MED ORDER — LORAZEPAM 0.5 MG PO TABS
0.5000 mg | ORAL_TABLET | Freq: Four times a day (QID) | ORAL | Status: DC | PRN
  Administered 2015-04-02 – 2015-04-05 (×5): 0.5 mg via ORAL
  Filled 2015-04-02 (×5): qty 1

## 2015-04-02 MED ORDER — 0.9 % SODIUM CHLORIDE (POUR BTL) OPTIME
TOPICAL | Status: DC | PRN
  Administered 2015-04-02: 1000 mL

## 2015-04-02 MED ORDER — LACTATED RINGERS IV SOLN
INTRAVENOUS | Status: DC
  Administered 2015-04-02 (×2): via INTRAVENOUS

## 2015-04-02 MED ORDER — FENTANYL CITRATE (PF) 250 MCG/5ML IJ SOLN
INTRAMUSCULAR | Status: AC
  Filled 2015-04-02: qty 5

## 2015-04-02 MED ORDER — HYDROMORPHONE HCL 1 MG/ML IJ SOLN: 0.2500 mg | INTRAMUSCULAR | Status: DC | PRN

## 2015-04-02 MED ORDER — DOCUSATE SODIUM 100 MG PO CAPS
100.0000 mg | ORAL_CAPSULE | Freq: Two times a day (BID) | ORAL | Status: DC
Start: 2015-04-02 — End: 2015-04-06
  Administered 2015-04-02 – 2015-04-05 (×6): 100 mg via ORAL
  Filled 2015-04-02 (×7): qty 1

## 2015-04-02 MED ORDER — LIDOCAINE HCL (CARDIAC) 20 MG/ML IV SOLN
INTRAVENOUS | Status: DC | PRN
  Administered 2015-04-02: 20 mg via INTRAVENOUS
  Administered 2015-04-02: 30 mg via INTRAVENOUS

## 2015-04-02 MED ORDER — DIPHENHYDRAMINE HCL 12.5 MG/5ML PO ELIX: 12.5000 mg | ORAL_SOLUTION | ORAL | Status: DC | PRN

## 2015-04-02 MED ORDER — SODIUM CHLORIDE 0.9 % IR SOLN
Status: DC | PRN
  Administered 2015-04-02: 3000 mL

## 2015-04-02 MED ORDER — HYDROMORPHONE HCL 1 MG/ML IJ SOLN
0.5000 mg | INTRAMUSCULAR | Status: DC | PRN
  Administered 2015-04-02: 0.5 mg via INTRAVENOUS
  Filled 2015-04-02 (×2): qty 1

## 2015-04-02 MED ORDER — DEXAMETHASONE SODIUM PHOSPHATE 10 MG/ML IJ SOLN
10.0000 mg | Freq: Three times a day (TID) | INTRAMUSCULAR | Status: AC
Start: 2015-04-02 — End: 2015-04-03
  Administered 2015-04-02 – 2015-04-03 (×3): 10 mg via INTRAVENOUS
  Filled 2015-04-02 (×3): qty 1

## 2015-04-02 MED ORDER — FENTANYL CITRATE (PF) 100 MCG/2ML IJ SOLN
INTRAMUSCULAR | Status: DC | PRN
  Administered 2015-04-02: 50 ug via INTRAVENOUS

## 2015-04-02 MED ORDER — BUPIVACAINE-EPINEPHRINE 0.25% -1:200000 IJ SOLN
INTRAMUSCULAR | Status: DC | PRN
  Administered 2015-04-02: 30 mL

## 2015-04-02 MED ORDER — METOCLOPRAMIDE HCL 5 MG/ML IJ SOLN
10.0000 mg | Freq: Four times a day (QID) | INTRAMUSCULAR | Status: AC
  Administered 2015-04-02 – 2015-04-03 (×3): 10 mg via INTRAVENOUS
  Filled 2015-04-02 (×3): qty 2

## 2015-04-02 MED ORDER — EZETIMIBE-SIMVASTATIN 10-40 MG PO TABS
1.0000 | ORAL_TABLET | Freq: Every day | ORAL | Status: DC
  Administered 2015-04-03 – 2015-04-05 (×3): 1 via ORAL
  Filled 2015-04-02 (×6): qty 1

## 2015-04-02 MED ORDER — POTASSIUM CHLORIDE IN NACL 20-0.9 MEQ/L-% IV SOLN
INTRAVENOUS | Status: DC
  Administered 2015-04-02 (×2): via INTRAVENOUS
  Filled 2015-04-02 (×3): qty 1000

## 2015-04-02 SURGICAL SUPPLY — 77 items
APL SKNCLS STERI-STRIP NONHPOA (GAUZE/BANDAGES/DRESSINGS) ×1
BANDAGE ESMARK 6X9 LF (GAUZE/BANDAGES/DRESSINGS) ×1 IMPLANT
BENZOIN TINCTURE PRP APPL 2/3 (GAUZE/BANDAGES/DRESSINGS) ×3 IMPLANT
BLADE SAGITTAL 25.0X1.19X90 (BLADE) ×2 IMPLANT
BLADE SAGITTAL 25.0X1.19X90MM (BLADE) ×1
BLADE SAW RECIP 87.9 MT (BLADE) ×2 IMPLANT
BLADE SAW SGTL 11.0X1.19X90.0M (BLADE) IMPLANT
BLADE SAW SGTL 13.0X1.19X90.0M (BLADE) ×3 IMPLANT
BLADE SURG 10 STRL SS (BLADE) ×6 IMPLANT
BNDG CMPR 9X6 STRL LF SNTH (GAUZE/BANDAGES/DRESSINGS) ×1
BNDG CMPR MED 15X6 ELC VLCR LF (GAUZE/BANDAGES/DRESSINGS) ×1
BNDG ELASTIC 6X15 VLCR STRL LF (GAUZE/BANDAGES/DRESSINGS) ×3 IMPLANT
BNDG ESMARK 6X9 LF (GAUZE/BANDAGES/DRESSINGS) ×3
BOWL SMART MIX CTS (DISPOSABLE) ×3 IMPLANT
CAPT KNEE TOTAL 3 ATTUNE ×2 IMPLANT
CEMENT HV SMART SET (Cement) ×6 IMPLANT
CLOSURE WOUND 1/2 X4 (GAUZE/BANDAGES/DRESSINGS) ×1
COVER SURGICAL LIGHT HANDLE (MISCELLANEOUS) ×3 IMPLANT
CUFF TOURNIQUET SINGLE 34IN LL (TOURNIQUET CUFF) ×3 IMPLANT
CUFF TOURNIQUET SINGLE 44IN (TOURNIQUET CUFF) IMPLANT
DECANTER SPIKE VIAL GLASS SM (MISCELLANEOUS) ×1 IMPLANT
DRAPE EXTREMITY T 121X128X90 (DRAPE) ×3 IMPLANT
DRAPE IMP U-DRAPE 54X76 (DRAPES) ×2 IMPLANT
DRAPE INCISE IOBAN 66X45 STRL (DRAPES) ×1 IMPLANT
DRAPE PROXIMA HALF (DRAPES) ×3 IMPLANT
DRAPE U-SHAPE 47X51 STRL (DRAPES) ×3 IMPLANT
DRSG AQUACEL AG ADV 3.5X14 (GAUZE/BANDAGES/DRESSINGS) ×3 IMPLANT
DRSG PAD ABDOMINAL 8X10 ST (GAUZE/BANDAGES/DRESSINGS) ×6 IMPLANT
DURAPREP 26ML APPLICATOR (WOUND CARE) ×4 IMPLANT
ELECT CAUTERY BLADE 6.4 (BLADE) ×3 IMPLANT
ELECT REM PT RETURN 9FT ADLT (ELECTROSURGICAL) ×3
ELECTRODE REM PT RTRN 9FT ADLT (ELECTROSURGICAL) ×1 IMPLANT
EVACUATOR 1/8 PVC DRAIN (DRAIN) ×3 IMPLANT
FACESHIELD WRAPAROUND (MASK) ×6 IMPLANT
FACESHIELD WRAPAROUND OR TEAM (MASK) ×1 IMPLANT
GAUZE SPONGE 4X4 12PLY STRL (GAUZE/BANDAGES/DRESSINGS) ×3 IMPLANT
GLOVE BIO SURGEON STRL SZ7 (GLOVE) ×3 IMPLANT
GLOVE BIOGEL PI IND STRL 7.0 (GLOVE) ×1 IMPLANT
GLOVE BIOGEL PI IND STRL 7.5 (GLOVE) ×1 IMPLANT
GLOVE BIOGEL PI INDICATOR 7.0 (GLOVE) ×2
GLOVE BIOGEL PI INDICATOR 7.5 (GLOVE) ×2
GLOVE SS BIOGEL STRL SZ 7.5 (GLOVE) ×1 IMPLANT
GLOVE SUPERSENSE BIOGEL SZ 7.5 (GLOVE) ×2
GOWN STRL REUS W/ TWL LRG LVL3 (GOWN DISPOSABLE) ×1 IMPLANT
GOWN STRL REUS W/ TWL XL LVL3 (GOWN DISPOSABLE) ×2 IMPLANT
GOWN STRL REUS W/TWL LRG LVL3 (GOWN DISPOSABLE) ×3
GOWN STRL REUS W/TWL XL LVL3 (GOWN DISPOSABLE) ×6
HANDPIECE INTERPULSE COAX TIP (DISPOSABLE) ×3
HOOD PEEL AWAY FACE SHEILD DIS (HOOD) ×6 IMPLANT
IMMOBILIZER KNEE 22 UNIV (SOFTGOODS) ×3 IMPLANT
KIT BASIN OR (CUSTOM PROCEDURE TRAY) ×3 IMPLANT
KIT ROOM TURNOVER OR (KITS) ×3 IMPLANT
MANIFOLD NEPTUNE II (INSTRUMENTS) ×3 IMPLANT
MARKER SKIN DUAL TIP RULER LAB (MISCELLANEOUS) ×3 IMPLANT
NS IRRIG 1000ML POUR BTL (IV SOLUTION) ×3 IMPLANT
PACK TOTAL JOINT (CUSTOM PROCEDURE TRAY) ×3 IMPLANT
PACK UNIVERSAL I (CUSTOM PROCEDURE TRAY) ×3 IMPLANT
PAD ARMBOARD 7.5X6 YLW CONV (MISCELLANEOUS) ×6 IMPLANT
PADDING CAST COTTON 6X4 STRL (CAST SUPPLIES) ×3 IMPLANT
RUBBERBAND STERILE (MISCELLANEOUS) ×3 IMPLANT
SET HNDPC FAN SPRY TIP SCT (DISPOSABLE) ×1 IMPLANT
STRIP CLOSURE SKIN 1/2X4 (GAUZE/BANDAGES/DRESSINGS) ×2 IMPLANT
SUCTION FRAZIER TIP 10 FR DISP (SUCTIONS) ×3 IMPLANT
SUT MNCRL AB 3-0 PS2 18 (SUTURE) ×3 IMPLANT
SUT VIC AB 0 CT1 27 (SUTURE) ×6
SUT VIC AB 0 CT1 27XBRD ANBCTR (SUTURE) ×2 IMPLANT
SUT VIC AB 1 CT1 27 (SUTURE) ×6
SUT VIC AB 1 CT1 27XBRD ANBCTR (SUTURE) ×1 IMPLANT
SUT VIC AB 2-0 CT1 27 (SUTURE) ×6
SUT VIC AB 2-0 CT1 TAPERPNT 27 (SUTURE) ×2 IMPLANT
SYR 30ML SLIP (SYRINGE) ×3 IMPLANT
TOWEL OR 17X24 6PK STRL BLUE (TOWEL DISPOSABLE) ×3 IMPLANT
TOWEL OR 17X26 10 PK STRL BLUE (TOWEL DISPOSABLE) ×3 IMPLANT
TRAY FOLEY CATH 16FR SILVER (SET/KITS/TRAYS/PACK) ×3 IMPLANT
TUBE CONNECTING 12'X1/4 (SUCTIONS) ×1
TUBE CONNECTING 12X1/4 (SUCTIONS) ×2 IMPLANT
YANKAUER SUCT BULB TIP NO VENT (SUCTIONS) ×3 IMPLANT

## 2015-04-02 NOTE — Care Management Note (Signed)
Case Management Note  Patient Details  Name: Marqual Mi MRN: [id number] Date of Birth: [birth year]  Subjective/Objective:    81 yr old male s/p right total hip arthroplasty.  Action/Plan:  Case manager spoke with patient and wife at the bedside concerning home health and DME needs at discharge. Patient was preoperatively setup with [facility], no changes. Patient states the walker and 3in1 have been delivered to their home.     Expected Discharge Date:   04/03/15                Expected Discharge Plan:  [place]  In-House Referral:     Discharge planning Services  CM Consult  Post Acute Care Choice:  Home Health Choice offered to:  Patient, Spouse  DME Arranged:  3-N-1, Walker rolling DME Agency:  [employer]  HH Arranged:  PT HH Agency:  [facility]  Status of Service:  Completed, signed off  Medicare Important Message Given:    Date Medicare IM Given:    Medicare IM give by:    Date Additional Medicare IM Given:    Additional Medicare Important Message give by:     If discussed at [place] of Stay Meetings, dates discussed:    Additional Comments:  Ninfa Meeker, RN 04/02/2015, 1:29 PM

## 2015-04-02 NOTE — Op Note (Signed)
MRN:     [id number] DOB/AGE:    1932-06-27 / 81 y.o.       OPERATIVE REPORT    DATE OF PROCEDURE:  04/02/2015       PREOPERATIVE DIAGNOSIS:   Primary localized OA right knee      Estimated body mass index is 28.49 kg/(m^2) as calculated from the following:   Height as of this encounter: 5\' 9"  (1.753 m).   Weight as of this encounter: 87.544 kg (193 lb).                                                        POSTOPERATIVE DIAGNOSIS:   same                                                                      PROCEDURE:  Procedure(s): TOTAL KNEE ARTHROPLASTY Using Depuy Attune RP implants #8 Femur, #8Tibia, 38mm attune RP bearing, 35 Patella     SURGEON: Lanessa Shill A    ASSISTANT:  Kirstin Shepperson PA-C   (Present and scrubbed throughout the case, critical for assistance with exposure, retraction, instrumentation, and closure.)         ANESTHESIA: GET with Femoral Nerve Block  DRAINS: foley, 2 medium hemovac in knee   TOURNIQUET TIME: 35TIR   COMPLICATIONS:  None     SPECIMENS: None   INDICATIONS FOR PROCEDURE: The patient has  djd right knee, varus deformities, XR shows bone on bone arthritis. Patient has failed all conservative measures including anti-inflammatory medicines, narcotics, attempts at  exercise and weight loss, cortisone injections and viscosupplementation.  Risks and benefits of surgery have been discussed, questions answered.   DESCRIPTION OF PROCEDURE: The patient identified by armband, received  right femoral nerve block and IV antibiotics, in the holding area at [facility]. Patient taken to the operating room, appropriate anesthetic  monitors were attached General endotracheal anesthesia induced with  the patient in supine position, Foley catheter was inserted. Tourniquet  applied high to the operative thigh. Lateral post and foot positioner  applied to the table, the lower extremity was then prepped and draped  in usual sterile fashion from the ankle  to the tourniquet. Time-out procedure was performed. The limb was wrapped with an Esmarch bandage and the tourniquet inflated to 365 mmHg. We began the operation by making the anterior midline incision starting at handbreadth above the patella going over the patella 1 cm medial to and  4 cm distal to the tibial tubercle. Small bleeders in the skin and the  subcutaneous tissue identified and cauterized. Transverse retinaculum was incised and reflected medially and a medial parapatellar arthrotomy was accomplished. the patella was everted and theprepatellar fat pad resected. The superficial medial collateral  ligament was then elevated from anterior to posterior along the proximal  flare of the tibia and anterior half of the menisci resected. The knee was hyperflexed exposing bone on bone arthritis. Peripheral and notch osteophytes as well as the cruciate ligaments were then resected. We continued to  work our way around posteriorly along the proximal tibia, and externally  rotated the tibia subluxing it out from underneath the femur. A McHale  retractor was placed through the notch and a lateral Hohmann retractor  placed, and we then drilled through the proximal tibia in line with the  axis of the tibia followed by an intramedullary guide rod and 2-degree  posterior slope cutting guide. The tibial cutting guide was pinned into place  allowing resection of 4 mm of bone medially and about 6 mm of bone  laterally because of her varus deformity. Satisfied with the tibial resection, we then  entered the distal femur 2 mm anterior to the PCL origin with the  intramedullary guide rod and applied the distal femoral cutting guide  set at 53mm, with 5 degrees of valgus. This was pinned along the  epicondylar axis. At this point, the distal femoral cut was accomplished without difficulty. We then sized for a #8 femoral component and pinned the guide in 3 degrees of external rotation.The chamfer cutting guide was  pinned into place. The anterior, posterior, and chamfer cuts were accomplished without difficulty followed by  the Attune RP box cutting guide and the box cut. We also removed posterior osteophytes from the posterior femoral condyles. At this  time, the knee was brought into full extension. We checked our  extension and flexion gaps and found them symmetric at 38mm.  The patella thickness measured at 24 mm. We set the cutting guide at 15 and removed the posterior 9.5-10 mm  of the patella sized for 35 button and drilled the lollipop. The knee  was then once again hyperflexed exposing the proximal tibia. We sized for a #8 tibial base plate, applied the smokestack and the conical reamer followed by the the Delta fin keel punch. We then hammered into place the Attune RP trial femoral component, inserted a 6-mm trial bearing, trial patellar button, and took the knee through range of motion from 0-130 degrees. No thumb pressure was required for patellar  tracking. At this point, all trial components were removed, a double batch of DePuy HV cement  was mixed and applied to all bony metallic mating surfaces except for the posterior condyles of the femur itself. In order, we  hammered into place the tibial tray and removed excess cement, the femoral component and removed excess cement, a 6-mm Attune RP bearing  was inserted, and the knee brought to full extension with compression.  The patellar button was clamped into place, and excess cement  removed. While the cement cured the wound was irrigated out with normal saline solution pulse lavage, and medium Hemovac drains were placed.. Ligament stability and patellar tracking were checked and found to be excellent. The tourniquet was then released and hemostasis was obtained with cautery. The parapatellar arthrotomy was closed with  #1 ethibond suture. The subcutaneous tissue with 0 and 2-0 undyed  Vicryl suture, and 4-0 Monocryl.. A dressing of Xeroform,  4 x 4,  dressing sponges, Webril, and Ace wrap applied. Needle and sponge count were correct times 2.The patient awakened, extubated, and taken to recovery room without difficulty. Vascular status was normal, pulses 2+ and symmetric.   Eugene [name] A 04/02/2015, 9:14 AM

## 2015-04-02 NOTE — Progress Notes (Signed)
Orthopedic Tech Progress Note Patient Details:  Eugene [name] [birth year] [id number] Put on cpm at 7:30 pm Patient ID: Eugene [name], male   DOB: 03-26-1933, 81 y.o.   MRN: [id number]   Eugene [name] 04/02/2015, 7:32 PM

## 2015-04-02 NOTE — Progress Notes (Signed)
Orthopedic Tech Progress Note Patient Details:  Eugene [name] 05/14/1933 [id number]  CPM Right Knee CPM Right Knee: On Right Knee Flexion (Degrees): 90 Right Knee Extension (Degrees): 0 Additional Comments: trapeze bar patient helper   Hildred Priest 04/02/2015, 9:51 AM

## 2015-04-02 NOTE — Interval H&P Note (Signed)
History and Physical Interval Note:  04/02/2015 7:02 AM  Eugene [name]  has presented today for surgery, with the diagnosis of primary localized OA right knee  The various methods of treatment have been discussed with the patient and family. After consideration of risks, benefits and other options for treatment, the patient has consented to  Procedure(s): TOTAL KNEE ARTHROPLASTY (Right) as a surgical intervention .  The patient's history has been reviewed, patient examined, no change in status, stable for surgery.  I have reviewed the patient's chart and labs.  Questions were answered to the patient's satisfaction.     [name]

## 2015-04-02 NOTE — Transfer of Care (Signed)
Immediate Anesthesia Transfer of Care Note  Patient: Eugene [name]  Procedure(s) Performed: Procedure(s): TOTAL KNEE ARTHROPLASTY (Right)  Patient Location: PACU  Anesthesia Type:MAC combined with regional for post-op pain  Level of Consciousness: awake, alert , oriented and patient cooperative  Airway & Oxygen Therapy: Patient Spontanous Breathing and Patient connected to nasal cannula oxygen  Post-op Assessment: Report given to RN and Post -op Vital signs reviewed and stable  Post vital signs: Reviewed and stable  Last Vitals:  Filed Vitals:   04/02/15 0622  BP: 128/68  Pulse: 61  Temp: 36.6 C  Resp: 20    Complications: No apparent anesthesia complications

## 2015-04-02 NOTE — Anesthesia Postprocedure Evaluation (Signed)
  Anesthesia Post-op Note  Patient: Eugene [name]  Procedure(s) Performed: Procedure(s) (LRB): TOTAL KNEE ARTHROPLASTY (Right)  Patient Location: PACU  Anesthesia Type: Spinal  Level of Consciousness: awake and alert   Airway and Oxygen Therapy: Patient Spontanous Breathing  Post-op Pain: mild  Post-op Assessment: Post-op Vital signs reviewed, Patient's Cardiovascular Status Stable, Respiratory Function Stable, Patent Airway and No signs of Nausea or vomiting  Last Vitals:  Filed Vitals:   04/02/15 1015  BP: 121/63  Pulse: 61  Temp:   Resp: 16    Post-op Vital Signs: stable   Complications: No apparent anesthesia complications

## 2015-04-02 NOTE — Progress Notes (Signed)
Utilization review completed.  

## 2015-04-02 NOTE — Anesthesia Preprocedure Evaluation (Signed)
Anesthesia Evaluation  Patient identified by MRN, date of birth, ID band Patient awake    Reviewed: Allergy & Precautions, NPO status , Patient's Chart, lab work & pertinent test results  Airway Mallampati: II  TM Distance: >3 FB Neck ROM: Full    Dental no notable dental hx.    Pulmonary former smoker,    Pulmonary exam normal breath sounds clear to auscultation       Cardiovascular negative cardio ROS Normal cardiovascular exam Rhythm:Regular Rate:Normal     Neuro/Psych negative neurological ROS  negative psych ROS   GI/Hepatic negative GI ROS, Neg liver ROS,   Endo/Other  negative endocrine ROSsarcoidosis  Renal/GU negative Renal ROS  negative genitourinary   Musculoskeletal negative musculoskeletal ROS (+)   Abdominal   Peds negative pediatric ROS (+)  Hematology negative hematology ROS (+)   Anesthesia Other Findings   Reproductive/Obstetrics negative OB ROS                             Anesthesia Physical Anesthesia Plan  ASA: II  Anesthesia Plan: Spinal   Post-op Pain Management:    Induction: Intravenous  Airway Management Planned: Simple Face Mask  Additional Equipment:   Intra-op Plan:   Post-operative Plan: Extubation in OR  Informed Consent: I have reviewed the patients History and Physical, chart, labs and discussed the procedure including the risks, benefits and alternatives for the proposed anesthesia with the patient or authorized representative who has indicated his/her understanding and acceptance.   Dental advisory given  Plan Discussed with: CRNA and Surgeon  Anesthesia Plan Comments:         Anesthesia Quick Evaluation

## 2015-04-02 NOTE — Evaluation (Signed)
Physical Therapy Evaluation Patient Details Name: Eugene [name] MRN: [id number] DOB: [birth year] Today's Date: 04/02/2015   History of Present Illness  81 y.o. male s/p Rt TKA. PMH: sarcoidosis.  Clinical Impression  Pt is s/p TKA resulting in the deficits listed below (see PT Problem List). Pt will benefit from skilled PT to increase their independence and safety with mobility to allow discharge to the venue listed below. While standing patient complaining of lightheadedness and noted to be diaphoretic. Returned to sitting in reclined position. BP 101/62 in sitting, nursing notified. Unable to tolerate ambulation at this time, will continue to progress.      Follow Up Recommendations Home health PT;Supervision for mobility/OOB    Equipment Recommendations  None recommended by PT;Other (comment) (reports having rw at home)    Recommendations for Other Services       Precautions / Restrictions Precautions Precautions: Knee;Fall Precaution Booklet Issued: Yes (comment) Precaution Comments: HEP provided, reviewed no pillows behind knee Required Braces or Orthoses: Knee Immobilizer - Right Knee Immobilizer - Right: Discontinue once straight leg raise with < 10 degree lag Restrictions Weight Bearing Restrictions: Yes RLE Weight Bearing: Weight bearing as tolerated      Mobility  Bed Mobility               General bed mobility comments: up in chair upon arrival  Transfers Overall transfer level: Needs assistance Equipment used: Rolling walker (2 wheeled) Transfers: Sit to/from Stand Sit to Stand: Mod assist         General transfer comment: cues for hand placement. Patient complains of incresing dizziness with standing and becoming diaphoretic, required return to sitting in reclined position.   Ambulation/Gait                Stairs            Wheelchair Mobility    Modified Rankin (Stroke Patients Only)       Balance Overall balance assessment:  Needs assistance Sitting-balance support: No upper extremity supported Sitting balance-Leahy Scale: Good     Standing balance support: Bilateral upper extremity supported Standing balance-Leahy Scale: Poor Standing balance comment: using rw                             Pertinent Vitals/Pain Pain Assessment: 0-10 Pain Score: 4  Pain Location: Rt knee Pain Descriptors / Indicators: Aching Pain Intervention(s): Monitored during session;Limited activity within patient's tolerance    Home Living Family/patient expects to be discharged to:: Private residence Living Arrangements: Spouse/significant other Available Help at Discharge: Family;Available 24 hours/day Type of Home: House Home Access: Stairs to enter Entrance Stairs-Rails: None Entrance Stairs-Number of Steps: 1 Home Layout: One level Home Equipment: Walker - 2 wheels      Prior Function Level of Independence: Independent               Hand Dominance        Extremity/Trunk Assessment   Upper Extremity Assessment: Defer to OT evaluation           Lower Extremity Assessment: RLE deficits/detail RLE Deficits / Details: able to performs SLR, >10 degrees lag.       Communication   Communication: No difficulties  Cognition Arousal/Alertness: Awake/alert Behavior During Therapy: WFL for tasks assessed/performed Overall Cognitive Status: Within Functional Limits for tasks assessed                      General  Comments General comments (skin integrity, edema, etc.): BP 101/62 after return to sitting from standing. Nursing notifed of BP and symptoms while standing.     Exercises Total Joint Exercises Ankle Circles/Pumps: AROM;Both;15 reps Quad Sets: Right;10 reps;Strengthening Gluteal Sets: Strengthening;Both;10 reps Heel Slides: AAROM;Right;10 reps Goniometric ROM: 90 degrees flexion      Assessment/Plan    PT Assessment Patient needs continued PT services  PT Diagnosis  Difficulty walking;Generalized weakness;Acute pain   PT Problem List Decreased strength;Decreased range of motion;Decreased activity tolerance;Decreased balance;Decreased mobility;Decreased knowledge of use of DME;Pain  PT Treatment Interventions DME instruction;Gait training;Stair training;Functional mobility training;Therapeutic activities;Therapeutic exercise;Balance training;Patient/family education   PT Goals (Current goals can be found in the Care Plan section) Acute Rehab PT Goals Patient Stated Goal: go home from hospital PT Goal Formulation: With patient Time For Goal Achievement: 04/16/15 Potential to Achieve Goals: Good    Frequency 7X/week   Barriers to discharge        Co-evaluation               End of Session Equipment Utilized During Treatment: Gait belt;Right knee immobilizer Activity Tolerance: Other (comment) (limited by dizziness and diaphoresis. ) Patient left: in chair;with call bell/phone within reach;with family/visitor present;Other (comment) (nursing notified. in bone foam) Nurse Communication: Mobility status;Precautions;Weight bearing status         Time: 4388-8757 PT Time Calculation (min) (ACUTE ONLY): 47 min   Charges:   PT Evaluation $Initial PT Evaluation Tier I: 1 Procedure PT Treatments $Therapeutic Activity: 23-37 mins   PT G Codes:        Cassell Clement, PT, CSCS Pager [phone] Office [phone]  04/02/2015, 3:54 PM

## 2015-04-02 NOTE — Anesthesia Procedure Notes (Addendum)
Spinal Patient location during procedure: OR Staffing Performed by: anesthesiologist  Preanesthetic Checklist Completed: patient identified, site marked, surgical consent, pre-op evaluation, timeout performed, IV checked, risks and benefits discussed and monitors and equipment checked Spinal Block Patient position: sitting Prep: Betadine Patient monitoring: heart rate, continuous pulse ox and blood pressure Location: L4-5 Injection technique: single-shot Needle Needle type: Sprotte  Needle gauge: 24 G Needle length: 9 cm Additional Notes Expiration date of kit checked and confirmed. Patient tolerated procedure well, without complications.    Anesthesia Regional Block:  Femoral nerve block  Pre-Anesthetic Checklist: ,, timeout performed, Correct Patient, Correct Site, Correct Laterality, Correct Procedure, Correct Position, site marked, Risks and benefits discussed,  Surgical consent,  Pre-op evaluation,  At surgeon's request and post-op pain management  Laterality: Right  Prep: chloraprep       Needles:  Injection technique: Single-shot  Needle Type: Echogenic Stimulator Needle     Needle Length: 9cm 9 cm Needle Gauge: 21 and 21 G    Additional Needles:  Procedures: ultrasound guided (picture in chart) Femoral nerve block Narrative:  Injection made incrementally with aspirations every 5 mL.  Performed by: Personally   Additional Notes: Patient tolerated the procedure well without complications

## 2015-04-03 ENCOUNTER — Encounter (HOSPITAL_COMMUNITY): Payer: Self-pay | Admitting: Orthopedic Surgery

## 2015-04-03 DIAGNOSIS — K920 Hematemesis: Secondary | ICD-10-CM

## 2015-04-03 LAB — CBC
HEMATOCRIT: 34.4 % — AB (ref 39.0–52.0)
HEMOGLOBIN: 11.2 g/dL — AB (ref 13.0–17.0)
MCH: 29.7 pg (ref 26.0–34.0)
MCHC: 32.6 g/dL (ref 30.0–36.0)
MCV: 91.2 fL (ref 78.0–100.0)
Platelets: 153 10*3/uL (ref 150–400)
RBC: 3.77 MIL/uL — ABNORMAL LOW (ref 4.22–5.81)
RDW: 13.9 % (ref 11.5–15.5)
WBC: 17.1 10*3/uL — AB (ref 4.0–10.5)

## 2015-04-03 LAB — BASIC METABOLIC PANEL
ANION GAP: 8 (ref 5–15)
BUN: 24 mg/dL — ABNORMAL HIGH (ref 6–20)
CALCIUM: 8.7 mg/dL — AB (ref 8.9–10.3)
CHLORIDE: 104 mmol/L (ref 101–111)
CO2: 24 mmol/L (ref 22–32)
CREATININE: 0.85 mg/dL (ref 0.61–1.24)
GFR calc non Af Amer: >60 mL/min (ref >60)
Glucose, Bld: 147 mg/dL — ABNORMAL HIGH (ref 65–99)
Potassium: 4.8 mmol/L (ref 3.5–5.1)
SODIUM: 136 mmol/L (ref 135–145)

## 2015-04-03 MED ORDER — SODIUM CHLORIDE 0.9 % IV SOLN
INTRAVENOUS | Status: DC
  Administered 2015-04-03 – 2015-04-04 (×2): via INTRAVENOUS

## 2015-04-03 MED ORDER — PANTOPRAZOLE SODIUM 40 MG IV SOLR
40.0000 mg | Freq: Two times a day (BID) | INTRAVENOUS | Status: DC
  Administered 2015-04-03 (×2): 40 mg via INTRAVENOUS
  Filled 2015-04-03 (×2): qty 40

## 2015-04-03 NOTE — Consult Note (Signed)
[facility] Consult: 8:29 AM 04/03/2015  LOS: 1 day    Referring Provider: dr Noemi Chapel Primary Care Physician:  Velna Hatchet, MD Primary Gastroenterologist:  Dr. Earlie Raveling, out of town.     Reason for Consultation:  CG emesis   HPI: Eugene [name] is a 81 y.o. male.  Hx squamous cell skin cancers.  O/A.  Sarcoidosis (2007 lung biopsy).  Adenomatous colon polyp 2009, no recurrent polyps in 01/2015 colonoscopy.  Never had an EGD.  S/p inguinal hernia repair.  Vertigo in 07/2014.  S/p right TKR on 11/7. Felt nauseated yesterday after surgery.  Vomited CG material x 2 at 6 PM.  Still some nausea but controlled with antiemetics.  Started on reglan IV but not PPI.  No stomach issues at home.  Occasional hiccups.  takes 81 mg ASA at home. Rare Aleve at home. Drinks 2 glasses of wine per week.  Neg fm hx of stomach or colonic disease.   Hgb is 11.2.  Was 14.9 on 03/20/15.  coags normal on 03/20/15.  BUN slightly up at 24, was 21 on 03/20/15.     Past Medical History  Diagnosis Date  . Hyperlipidemia   . Sarcoidosis ([place])     diagnosed 8-9 yrs ago  . BPH (benign prostatic hyperplasia)   . Incomplete right bundle branch block   . History of kidney stones   . Hernia of abdominal cavity   . Bladder stones   . Primary localized osteoarthritis of right knee 04/02/2015    Past Surgical History  Procedure Laterality Date  . Cardiovascular stress test  04/29/2012    normal nuclear stress study.   . Doppler echocardiography  04/29/2012    EF not noted. small perimembranous ventricular septal defect. small left to right ventricular shunt. moderate diffuse calcification involving noncoronary cusp of the aortic valve.   . Anterior cervical decomp/discectomy fusion  2007  . Bronchoscopy  2007  . Inguinal hernia repair Right  2009  . Cystoscopy  2004    Prior to Admission medications   Medication Sig Start Date End Date Taking? Authorizing Provider  aspirin 81 MG tablet Take 81 mg by mouth daily.   Yes Historical Provider, MD  finasteride (PROSCAR) 5 MG tablet Take 5 mg by mouth every evening.    Yes Historical Provider, MD  potassium citrate (UROCIT-K) 10 MEQ (1080 MG) SR tablet Take 1 tablet by mouth daily. 03/07/13  Yes Historical Provider, MD  VYTORIN 10-40 MG per tablet TAKE (1) TABLET DAILY AT BEDTIME. 07/03/14  Yes Troy Sine, MD    Scheduled Meds: . docusate sodium  100 mg Oral BID  . ezetimibe-simvastatin  1 tablet Oral QHS  . finasteride  5 mg Oral QPM  . metoCLOPramide (REGLAN) injection  10 mg Intravenous 4 times per day  . polyethylene glycol  17 g Oral BID  . potassium chloride  10 mEq Oral Daily   Infusions: . 0.9 % NaCl with KCl 20 mEq / L 100 mL/hr at 04/02/15 2351   PRN Meds: acetaminophen **OR** acetaminophen, alum &  mag hydroxide-simeth, diphenhydrAMINE, HYDROmorphone (DILAUDID) injection, LORazepam, menthol-cetylpyridinium **OR** phenol, metoCLOPramide **OR** metoCLOPramide (REGLAN) injection, ondansetron **OR** ondansetron (ZOFRAN) IV, oxyCODONE   Allergies as of 01/19/2015  . (No Known Allergies)    Family History  Problem Relation Age of Onset  . Heart attack Father 100  . Ovarian cancer Mother     Social History   Social History  . Marital Status: Married    Spouse Name: N/A  . Number of Children: N/A  . Years of Education: N/A   Occupational History  . Not on file.   Social History Main Topics  . Smoking status: Former Smoker    Types: Pipe  . Smokeless tobacco: Never Used     Comment: Quit 40 year  . Alcohol Use: 2.0 oz/week    4 Standard drinks or equivalent per week  . Drug Use: No  . Sexual Activity: Not on file   Other Topics Concern  . Not on file   Social History Narrative    REVIEW OF SYSTEMS: Constitutional:  Stable weight.  No  fatigue ENT:  No nose bleeds Pulm:  No cough or dyspnea CV:  No palpitations, no LE edema.  GU:  No hematuria, no frequency GI:  Per HPI.  No constipation or bpr. Heme:  No issues with excessive bleeding or bruising   Transfusions:  none Neuro:  No headaches, no peripheral tingling or numbness Derm:  Hx skin cancers, up to date with derm exams.  Endocrine:  No sweats or chills.  No polyuria or dysuria Immunization:  Not queried Travel:  None beyond local counties in last few months.    PHYSICAL EXAM: Vital signs in last 24 hours: Filed Vitals:   04/03/15 0618  BP: 117/48  Pulse: 74  Temp: 97.8 F (36.6 C)  Resp: 16   Wt Readings from Last 3 Encounters:  04/02/15 193 lb (87.544 kg)  03/20/15 193 lb 1.6 oz (87.59 kg)  05/04/14 190 lb 12.8 oz (86.546 kg)    General: pleasant.  Looks well and comfortable Head:  No swelling or asymmetry  Eyes:  No icterus or pallor Ears:  Not HOOH  Nose:  No congestion or discharge Mouth:  Clear and moist, good teeth Neck:  No mass or JVD.  No TMG Lungs:  Clear bil in front, no cough or dyspnea Heart: RRR.  No MRG Abdomen:  Soft, NT, ND.  S1/s2 audible.   Rectal: not done   Musc/Skeltl: right leg wrapped.  Extremities:  No CCE  Neurologic:  Oriented x 3.  No tremor.  No gross deficits.  Fully alert Skin:  No rash or telangectasia.  Tattoos:  none Nodes:  No cervical adenopathy   Psych:  Pleasant, calm, in good spirits.   Intake/Output from previous day: 11/07 0701 - 11/08 0700 In: 1100 [I.V.:1100] Out: 1550 [Urine:1350; Drains:175; Blood:25] Intake/Output this shift:    LAB RESULTS:  Recent Labs  04/03/15 0422  WBC 17.1*  HGB 11.2*  HCT 34.4*  PLT 153   BMET Lab Results  Component Value Date   NA 136 04/03/2015   NA 138 03/20/2015   NA 142 08/21/2014   K 4.8 04/03/2015   K 4.4 03/20/2015   K 4.1 08/21/2014   CL 104 04/03/2015   CL 104 03/20/2015   CL 104 08/21/2014   CO2 24 04/03/2015   CO2 28 03/20/2015    CO2 27 08/21/2014   GLUCOSE 147* 04/03/2015   GLUCOSE 96 03/20/2015   GLUCOSE 92 08/21/2014  BUN 24* 04/03/2015   BUN 21* 03/20/2015   BUN 18 08/21/2014   CREATININE 0.85 04/03/2015   CREATININE 0.97 03/20/2015   CREATININE 0.90 08/21/2014   CALCIUM 8.7* 04/03/2015   CALCIUM 10.1 03/20/2015   CALCIUM 10.0 08/21/2014   LFT No results for input(s): PROT, ALBUMIN, AST, ALT, ALKPHOS, BILITOT, BILIDIR, IBILI in the last 72 hours. PT/INR Lab Results  Component Value Date   INR 1.10 03/20/2015   INR 1.03 08/21/2014   Hepatitis Panel No results for input(s): HEPBSAG, HCVAB, HEPAIGM, HEPBIGM in the last 72 hours. C-Diff No components found for: CDIFF Lipase  No results found for: LIPASE      RADIOLOGY STUDIES: No results found.  ENDOSCOPIC STUDIES: Colonoscopies in 2009 and ~ 01/2015.  Pathology 2009.  Left colon polyp: tubular adenoma, no HGD.   IMPRESSION:    *  CG emesis.   Suspect esophagitis, gastritis,    *  ABL anemia.  No where near requiring blood transfusion.   *  S/p 11/7 right TKR      PLAN:     *  Start bid iv Protonix.   Set EGD for tomorrow. Wife is Theatre manager [phone].    Azucena Freed  04/03/2015, 8:29 AM Pager: [phone]   Attending Addendum I have taken an interval history, reviewed the chart, and examined the patient. I agree with the Advanced Practitioner's note and impression. Patient is s/p R TKA recently and developed a few episodes of coffee ground emesis leading to admission. He has not had further episodes or evidence of melena since admission, but did have a drop in H/H. On IV PPI now. Discussed recommendation for upper endoscopy to clarify the etiology and risk stratify, and treat high risk lesion if needed. Will rule out PUD, esophagitis, gastritis, etc. Following discussion of risks / benefits he wished to proceed. Set for EGD tomorrow AM. Clear liquids in the interim and NPO after midnight. If symptoms recur or worsen in the  interim please contact us for reassessment. Hemodynamically stable, await repeat H/H.   Prospect Cellar, MD [facility] Pager [phone]

## 2015-04-03 NOTE — Evaluation (Signed)
Occupational Therapy Evaluation Patient Details Name: Eugene [name] MRN: [id number] DOB: 1933/01/29 Today's Date: 04/03/2015    History of Present Illness 81 y.o. male s/p Rt TKA. PMH: sarcoidosis.   Clinical Impression   Pt reports he was independent with ADLs PTA. Currently pt is overall min A with LB ADLs and functional mobility. Began ADL and safety education with pt and wife; they verbalize understanding. Pts SpO2 in mid 80s with functional mobility (2L O2 with [place]), quickly returned to mid 90s in sitting. Pt reports no nausea or dizziness at this time. Pt planning to d/c home with 24/7 supervision from his wife. Pt would benefit from continued skilled OT in order to maximize independence and safety with functional transfers.     Follow Up Recommendations  No OT follow up;Supervision - Intermittent    Equipment Recommendations  None recommended by OT    Recommendations for Other Services       Precautions / Restrictions Precautions Precautions: Knee;Fall Required Braces or Orthoses: Knee Immobilizer - Right Restrictions Weight Bearing Restrictions: Yes RLE Weight Bearing: Weight bearing as tolerated      Mobility Bed Mobility Overal bed mobility: Needs Assistance Bed Mobility: Supine to Sit     Supine to sit: Supervision     General bed mobility comments: Supervison for safety, no physical assist needed.   Transfers Overall transfer level: Needs assistance Equipment used: Rolling walker (2 wheeled) Transfers: Sit to/from [facility] Sit to Stand: Min assist Stand pivot transfers: Min guard       General transfer comment: Min A to boost up from EOB. VC for hand placement, pt continued to pull up from RW with both hands. Attempted to stand x 3 before pt was able to come to full standing position. R knee immoblizer applied prior to standing.     Balance Overall balance assessment: Needs assistance Sitting-balance support: Feet supported Sitting  balance-Leahy Scale: Good     Standing balance support: Bilateral upper extremity supported Standing balance-Leahy Scale: Poor Standing balance comment: RW for suport                            ADL Overall ADL's : Needs assistance/impaired Eating/Feeding: Set up;Sitting   Grooming: Set up;Sitting       Lower Body Bathing: Minimal assistance;Sit to/from stand       Lower Body Dressing: Minimal assistance;Sit to/from stand   Toilet Transfer: Minimal assistance;BSC;Stand-pivot;RW Armed forces technical officer Details (indicate cue type and reason): Simulated transfer with stand pivot from EOB to chair          Functional mobility during ADLs: Minimal assistance;Rolling walker General ADL Comments: Wife present during OT eval. Educated pt on compensatory strategies for LB ADLs, safety with RW, home safety, edema management techniques; pt verbalized understanding. Discussed use of AE for increased independence with LB ADLs, pt declined stating wife could help as needed.       Vision     Perception     Praxis      Pertinent Vitals/Pain Pain Assessment: Faces Faces Pain Scale: Hurts little more Pain Location: R knee Pain Descriptors / Indicators: Sore Pain Intervention(s): Limited activity within patient's tolerance;Monitored during session;Repositioned;Ice applied     Hand Dominance     Extremity/Trunk Assessment Upper Extremity Assessment Upper Extremity Assessment: Overall WFL for tasks assessed   Lower Extremity Assessment Lower Extremity Assessment: Defer to PT evaluation   Cervical / Trunk Assessment Cervical / Trunk Assessment: Normal  Communication Communication Communication: No difficulties   Cognition Arousal/Alertness: Awake/alert Behavior During Therapy: WFL for tasks assessed/performed Overall Cognitive Status: Within Functional Limits for tasks assessed                     General Comments       Exercises       Shoulder  Instructions      Home Living Family/patient expects to be discharged to:: Private residence Living Arrangements: Spouse/significant other Available Help at Discharge: Family;Available 24 hours/day Type of Home: House Home Access: Stairs to enter [employer] of Steps: 1 Entrance Stairs-Rails: None Home Layout: One level     Bathroom Shower/Tub: Occupational psychologist: Handicapped height Bathroom Accessibility: Yes How Accessible: Accessible via walker Home Equipment: [place] - 2 wheels;Bedside commode;Shower seat;Grab bars - tub/shower          Prior Functioning/Environment Level of Independence: Independent             OT Diagnosis: Acute pain;Generalized weakness   OT Problem List: Decreased strength;Decreased activity tolerance;Impaired balance (sitting and/or standing);Decreased safety awareness;Decreased knowledge of use of DME or AE;Decreased knowledge of precautions;Pain   OT Treatment/Interventions: Self-care/ADL training;DME and/or AE instruction;Patient/family education    OT Goals(Current goals can be found in the care plan section) Acute Rehab OT Goals Patient Stated Goal: go home from hospital OT Goal Formulation: With patient Time For Goal Achievement: 04/17/15 Potential to Achieve Goals: Good ADL Goals Pt Will Perform Grooming: with supervision;standing Pt Will Transfer to Toilet: with supervision;ambulating;bedside commode (BSC over toilet) Pt Will Perform Toileting - Clothing Manipulation and hygiene: with supervision;sit to/from stand Pt Will Perform Tub/Shower Transfer: Shower transfer;with supervision;ambulating;shower seat;rolling walker  OT Frequency: Min 2X/week   Barriers to D/C:            Co-evaluation              End of Session Equipment Utilized During Treatment: Gait belt;Rolling walker;Right knee immobilizer CPM Right Knee CPM Right Knee: Off Nurse Communication: Other (comment) (pt with question about  liquid diet )  Activity Tolerance: Patient tolerated treatment well Patient left: in chair;with call bell/phone within reach;with family/visitor present   Time: 1638-4665 OT Time Calculation (min): 28 min Charges:  OT General Charges $OT Visit: 1 Procedure OT Evaluation $Initial OT Evaluation Tier I: 1 Procedure OT Treatments $Self Care/Home Management : 8-22 mins G-Codes:     Binnie Kand M.S., OTR/L Pager: [phone]  04/03/2015, 12:11 PM

## 2015-04-03 NOTE — Progress Notes (Signed)
Physical Therapy Treatment Patient Details Name: Eugene [name] MRN: [id number] DOB: 10-Jun-1932 Today's Date: 04/03/2015    History of Present Illness 81 y.o. male s/p Rt TKA. PMH: sarcoidosis.    PT Comments    Patient progressing slowly due to medical based concerns. Multiple attempts at ambulation performed today but limited initially with nausea and emesis. Following sessions limited by fatigue and SpO2. Will continue to progress mobility and HEP as tolerated with continued anticipation of D/C to home with family assistance.   Follow Up Recommendations  Home health PT;Supervision for mobility/OOB     Equipment Recommendations  None recommended by PT    Recommendations for Other Services       Precautions / Restrictions Precautions Precautions: Knee;Fall Required Braces or Orthoses: Knee Immobilizer - Right Knee Immobilizer - Right: Discontinue once straight leg raise with < 10 degree lag Restrictions Weight Bearing Restrictions: Yes RLE Weight Bearing: Weight bearing as tolerated    Mobility  Bed Mobility      General bed mobility comments: up in chair upon arrival  Transfers Overall transfer level: Needs assistance Equipment used: Rolling walker (2 wheeled) Transfers: Sit to/from Stand Sit to Stand: Min assist;Min guard Stand pivot transfers: Min guard       General transfer comment: min assist on first attempt and min guard on second attempt. Reminder cues each time for hand placement.   Ambulation/Gait Ambulation/Gait assistance: Min guard Ambulation Distance (Feet): 15 Feet (5 feet X1, 10 feet X1) Assistive device: Rolling walker (2 wheeled) Gait Pattern/deviations: Step-to pattern;Decreased stance time - right;Decreased weight shift to right Gait velocity: decreased   General Gait Details: First ambulation limited by nausea with emesis, following ambulation performed in room. SpO2 dropping to 85% without O2, reapplied O2 and back to 92%. Reminder for  breathing throughout session.    Stairs            Wheelchair Mobility    Modified Rankin (Stroke Patients Only)       Balance Overall balance assessment: Needs assistance Sitting-balance support: No upper extremity supported Sitting balance-Leahy Scale: Good     Standing balance support: Bilateral upper extremity supported Standing balance-Leahy Scale: Poor Standing balance comment: using rw                    Cognition Arousal/Alertness: Awake/alert Behavior During Therapy: WFL for tasks assessed/performed Overall Cognitive Status: Within Functional Limits for tasks assessed                      Exercises Total Joint Exercises Ankle Circles/Pumps: AROM;Both;15 reps Quad Sets: Right;10 reps;Strengthening Heel Slides: AAROM;Right;10 reps Hip ABduction/ADduction: Strengthening;Right;10 reps Straight Leg Raises: Strengthening;Right;5 reps;Other (comment) (unable to maintain knee extension.) Goniometric ROM: knee flexion 92 degrees    General Comments General comments (skin integrity, edema, etc.): Nursing notifed of SpO2 changes and emesis.       Pertinent Vitals/Pain Pain Assessment: 0-10 Pain Score: 2  Faces Pain Scale: Hurts little more Pain Location: Rt knee Pain Descriptors / Indicators: Sore Pain Intervention(s): Limited activity within patient's tolerance;Monitored during session    Home Living      Prior Function          PT Goals (current goals can now be found in the care plan section) Acute Rehab PT Goals Patient Stated Goal: Be able to get moving.  PT Goal Formulation: With patient Time For Goal Achievement: 04/16/15 Potential to Achieve Goals: Good Progress towards PT goals: Progressing toward goals (progress  limited by medical issues)    Frequency  7X/week    PT Plan Current plan remains appropriate    Co-evaluation             End of Session Equipment Utilized During Treatment: Gait belt;Oxygen;Right knee  immobilizer Activity Tolerance: Other (comment);Patient limited by fatigue (nausea ) Patient left: in chair;with call bell/phone within reach;with family/visitor present;Other (comment) (in knee extension)     Time: 7616-0737 PT Time Calculation (min) (ACUTE ONLY): 45 min  Charges:  $Gait Training: 23-37 mins $Therapeutic Exercise: 8-22 mins                    G Codes:      Cassell Clement, PT, CSCS Pager [phone] Office [phone]  04/03/2015, 1:10 PM

## 2015-04-03 NOTE — Progress Notes (Signed)
At start of shift at 8 am pt's O2 saturation 85% on room air, 94% on 2L O2. Wife very concerned about pt still requiring surgery at this point- pt and wife were educated. Pt has been working on incentive spirometer throughout the day, also still on continuous pulse ox. At 5 pm, pt was 89-90% on room air, 94% on 2L. Will continue to monitor.   [place], Jerry Caras

## 2015-04-03 NOTE — Progress Notes (Signed)
Physical Therapy Treatment Patient Details Name: Eugene [name] MRN: [id number] DOB: 06-Mar-1933 Today's Date: 04/03/2015    History of Present Illness 81 y.o. male s/p Rt TKA. PMH: sarcoidosis.    PT Comments    Patient with improved activity tolerance during second PT session as noted by the ability to ambulate 75 feet with min guard assistance using rw and 3L O2. Will continue to progress mobility as tolerated in anticipation of D/C to home with family support.   Follow Up Recommendations  Home health PT;Supervision for mobility/OOB     Equipment Recommendations  None recommended by PT    Recommendations for Other Services       Precautions / Restrictions Precautions Precautions: Knee;Fall Required Braces or Orthoses: Knee Immobilizer - Right Knee Immobilizer - Right: Other (comment) (patient declined use of immobilizer) Restrictions Weight Bearing Restrictions: Yes RLE Weight Bearing: Weight bearing as tolerated    Mobility  Bed Mobility Overal bed mobility: Needs Assistance Bed Mobility: Supine to Sit     Supine to sit: Supervision       Transfers Overall transfer level: Needs assistance Equipment used: Rolling walker (2 wheeled) Transfers: Sit to/from Stand Sit to Stand: Min guard         General transfer comment: no cues needed for hand position  Ambulation/Gait Ambulation/Gait assistance: Min guard Ambulation Distance (Feet): 75 Feet Assistive device: Rolling walker (2 wheeled) Gait Pattern/deviations: Step-through pattern;Decreased weight shift to right;Decreased stance time - right Gait velocity: decreased   General Gait Details: Using 3L O2 during ambulation, 94% before ambulation and 92% upon return.   Stairs            Wheelchair Mobility    Modified Rankin (Stroke Patients Only)       Balance Overall balance assessment: Needs assistance Sitting-balance support: No upper extremity supported Sitting balance-Leahy Scale: Good      Standing balance support: Bilateral upper extremity supported Standing balance-Leahy Scale: Poor Standing balance comment: using rw                    Cognition Arousal/Alertness: Awake/alert Behavior During Therapy: WFL for tasks assessed/performed Overall Cognitive Status: Within Functional Limits for tasks assessed                      Exercises     General Comments       Pertinent Vitals/Pain Pain Assessment: 0-10 Pain Score: 3  Pain Location: Rt knee Pain Descriptors / Indicators: Sore Pain Intervention(s): Limited activity within patient's tolerance;Monitored during session    Home Living                      Prior Function            PT Goals (current goals can now be found in the care plan section) Acute Rehab PT Goals Patient Stated Goal: get walking PT Goal Formulation: With patient Time For Goal Achievement: 04/16/15 Potential to Achieve Goals: Good Progress towards PT goals: Progressing toward goals    Frequency  7X/week    PT Plan Current plan remains appropriate    Co-evaluation             End of Session Equipment Utilized During Treatment: Gait belt;Oxygen Activity Tolerance: Patient tolerated treatment well Patient left: in chair;with call bell/phone within reach;with family/visitor present;Other (comment) (in knee extension stretch, refused bone foam. )     Time: 7939-0300 PT Time Calculation (min) (ACUTE ONLY): 16 min  Charges:  $Gait Training: 8-22 mins                    G Codes:      Linard Millers 2015-04-10, 4:20 PM

## 2015-04-03 NOTE — Progress Notes (Signed)
Subjective: 1 Day Post-Op Procedure(s) (LRB): TOTAL KNEE ARTHROPLASTY (Right) Patient reports pain as 4 on 0-10 scale.    Objective: Vital signs in last 24 hours: Temp:  [95 F (35 C)-98 F (36.7 C)] 97.8 F (36.6 C) (11/08 0618) Pulse Rate:  [61-82] 74 (11/08 0618) Resp:  [16-19] 16 (11/08 0618) BP: (96-122)/(48-70) 117/48 mmHg (11/08 0618) SpO2:  [90 %-96 %] 94 % (11/08 0618)  Intake/Output from previous day: 11/07 0701 - 11/08 0700 In: 1100 [I.V.:1100] Out: 1550 [Urine:1350; Drains:175; Blood:25] Intake/Output this shift:     Recent Labs  04/03/15 0422  HGB 11.2*    Recent Labs  04/03/15 0422  WBC 17.1*  RBC 3.77*  HCT 34.4*  PLT 153    Recent Labs  04/03/15 0422  NA 136  K 4.8  CL 104  CO2 24  BUN 24*  CREATININE 0.85  GLUCOSE 147*  CALCIUM 8.7*   No results for input(s): LABPT, INR in the last 72 hours.  ABD soft Neurovascular intact Sensation intact distally Intact pulses distally Incision: dressing C/D/I  COFFEE GROUND EMESIS LAST NIGHT THAT WAS GUIAC POSITIVE  Assessment/Plan: 1 Day Post-Op Procedure(s) (LRB): TOTAL KNEE ARTHROPLASTY (Right) Advance diet Up with therapy CONSULT DR MEDOFF   PATIENT'S GI Chaise Passarella J 04/03/2015, 7:24 AM

## 2015-04-04 ENCOUNTER — Inpatient Hospital Stay (HOSPITAL_COMMUNITY): Payer: Medicare Other

## 2015-04-04 ENCOUNTER — Inpatient Hospital Stay (HOSPITAL_COMMUNITY): Payer: Medicare Other | Admitting: Anesthesiology

## 2015-04-04 ENCOUNTER — Encounter (HOSPITAL_COMMUNITY)
Admission: RE | Disposition: A | Discharge status: Home Health | Payer: Medicare Other | Source: Ambulatory Visit | Attending: Orthopedic Surgery

## 2015-04-04 ENCOUNTER — Encounter (HOSPITAL_COMMUNITY): Payer: Self-pay | Admitting: Physician Assistant

## 2015-04-04 DIAGNOSIS — R7981 Abnormal blood-gas level: Secondary | ICD-10-CM

## 2015-04-04 DIAGNOSIS — K922 Gastrointestinal hemorrhage, unspecified: Secondary | ICD-10-CM

## 2015-04-04 DIAGNOSIS — K209 Esophagitis, unspecified without bleeding: Secondary | ICD-10-CM | POA: Insufficient documentation

## 2015-04-04 HISTORY — DX: Gastrointestinal hemorrhage, unspecified: K92.2

## 2015-04-04 HISTORY — PX: ESOPHAGOGASTRODUODENOSCOPY: SHX1529

## 2015-04-04 HISTORY — PX: ESOPHAGOGASTRODUODENOSCOPY: SHX5428

## 2015-04-04 HISTORY — DX: Abnormal blood-gas level: R79.81

## 2015-04-04 LAB — CBC
HCT: 28.8 % — ABNORMAL LOW (ref 39.0–52.0)
Hemoglobin: 9.4 g/dL — ABNORMAL LOW (ref 13.0–17.0)
MCH: 29.9 pg (ref 26.0–34.0)
MCHC: 32.6 g/dL (ref 30.0–36.0)
MCV: 91.7 fL (ref 78.0–100.0)
Platelets: 142 10*3/uL — ABNORMAL LOW (ref 150–400)
RBC: 3.14 MIL/uL — AB (ref 4.22–5.81)
RDW: 14.3 % (ref 11.5–15.5)
WBC: 16.5 10*3/uL — AB (ref 4.0–10.5)

## 2015-04-04 LAB — BASIC METABOLIC PANEL
Anion gap: 4 — ABNORMAL LOW (ref 5–15)
BUN: 19 mg/dL (ref 6–20)
CALCIUM: 9.2 mg/dL (ref 8.9–10.3)
CO2: 29 mmol/L (ref 22–32)
Chloride: 103 mmol/L (ref 101–111)
Creatinine, Ser: 0.9 mg/dL (ref 0.61–1.24)
GFR calc Af Amer: >60 mL/min (ref >60)
GLUCOSE: 110 mg/dL — AB (ref 65–99)
POTASSIUM: 4.8 mmol/L (ref 3.5–5.1)
SODIUM: 136 mmol/L (ref 135–145)

## 2015-04-04 SURGERY — EGD (ESOPHAGOGASTRODUODENOSCOPY)
Anesthesia: Monitor Anesthesia Care

## 2015-04-04 MED ORDER — FENTANYL CITRATE (PF) 100 MCG/2ML IJ SOLN
INTRAMUSCULAR | Status: DC | PRN
  Administered 2015-04-04: 50 ug via INTRAVENOUS

## 2015-04-04 MED ORDER — SUCRALFATE 1 GM/10ML PO SUSP
1.0000 g | Freq: Four times a day (QID) | ORAL | Status: DC
  Administered 2015-04-04 – 2015-04-06 (×9): 1 g via ORAL
  Filled 2015-04-04 (×9): qty 10

## 2015-04-04 MED ORDER — IOHEXOL 350 MG/ML SOLN
100.0000 mL | Freq: Once | INTRAVENOUS | Status: AC | PRN
  Administered 2015-04-04: 100 mL via INTRAVENOUS

## 2015-04-04 MED ORDER — PANTOPRAZOLE SODIUM 40 MG PO TBEC
40.0000 mg | DELAYED_RELEASE_TABLET | Freq: Two times a day (BID) | ORAL | Status: DC
  Administered 2015-04-04 – 2015-04-05 (×4): 40 mg via ORAL
  Filled 2015-04-04 (×5): qty 1

## 2015-04-04 MED ORDER — LIDOCAINE HCL (CARDIAC) 20 MG/ML IV SOLN
INTRAVENOUS | Status: DC | PRN
  Administered 2015-04-04: 50 mg via INTRAVENOUS

## 2015-04-04 MED ORDER — PROPOFOL 10 MG/ML IV BOLUS
INTRAVENOUS | Status: DC | PRN
  Administered 2015-04-04: 20 mg via INTRAVENOUS
  Administered 2015-04-04: 10 mg via INTRAVENOUS
  Administered 2015-04-04: 20 mg via INTRAVENOUS

## 2015-04-04 MED ORDER — LACTATED RINGERS IV SOLN
INTRAVENOUS | Status: DC
  Administered 2015-04-04: 1000 mL via INTRAVENOUS

## 2015-04-04 MED ORDER — BUTAMBEN-TETRACAINE-BENZOCAINE 2-2-14 % EX AERO
INHALATION_SPRAY | CUTANEOUS | Status: DC | PRN
  Administered 2015-04-04: 1 via TOPICAL

## 2015-04-04 NOTE — H&P (View-Only) (Signed)
[facility] Consult: 8:29 AM 04/03/2015  LOS: 1 day    Referring Provider: dr Noemi Chapel Primary Care Physician:  Velna Hatchet, MD Primary Gastroenterologist:  Dr. Earlie Raveling, out of town.     Reason for Consultation:  CG emesis   HPI: Eugene [name] is a 81 y.o. male.  Hx squamous cell skin cancers.  O/A.  Sarcoidosis (2007 lung biopsy).  Adenomatous colon polyp 2009, no recurrent polyps in 01/2015 colonoscopy.  Never had an EGD.  S/p inguinal hernia repair.  Vertigo in 07/2014.  S/p right TKR on 11/7. Felt nauseated yesterday after surgery.  Vomited CG material x 2 at 6 PM.  Still some nausea but controlled with antiemetics.  Started on reglan IV but not PPI.  No stomach issues at home.  Occasional hiccups.  takes 81 mg ASA at home. Rare Aleve at home. Drinks 2 glasses of wine per week.  Neg fm hx of stomach or colonic disease.   Hgb is 11.2.  Was 14.9 on 03/20/15.  coags normal on 03/20/15.  BUN slightly up at 24, was 21 on 03/20/15.     Past Medical History  Diagnosis Date  . Hyperlipidemia   . Sarcoidosis ([place])     diagnosed 8-9 yrs ago  . BPH (benign prostatic hyperplasia)   . Incomplete right bundle branch block   . History of kidney stones   . Hernia of abdominal cavity   . Bladder stones   . Primary localized osteoarthritis of right knee 04/02/2015    Past Surgical History  Procedure Laterality Date  . Cardiovascular stress test  04/29/2012    normal nuclear stress study.   . Doppler echocardiography  04/29/2012    EF not noted. small perimembranous ventricular septal defect. small left to right ventricular shunt. moderate diffuse calcification involving noncoronary cusp of the aortic valve.   . Anterior cervical decomp/discectomy fusion  2007  . Bronchoscopy  2007  . Inguinal hernia repair Right  2009  . Cystoscopy  2004    Prior to Admission medications   Medication Sig Start Date End Date Taking? Authorizing Provider  aspirin 81 MG tablet Take 81 mg by mouth daily.   Yes Historical Provider, MD  finasteride (PROSCAR) 5 MG tablet Take 5 mg by mouth every evening.    Yes Historical Provider, MD  potassium citrate (UROCIT-K) 10 MEQ (1080 MG) SR tablet Take 1 tablet by mouth daily. 03/07/13  Yes Historical Provider, MD  VYTORIN 10-40 MG per tablet TAKE (1) TABLET DAILY AT BEDTIME. 07/03/14  Yes Troy Sine, MD    Scheduled Meds: . docusate sodium  100 mg Oral BID  . ezetimibe-simvastatin  1 tablet Oral QHS  . finasteride  5 mg Oral QPM  . metoCLOPramide (REGLAN) injection  10 mg Intravenous 4 times per day  . polyethylene glycol  17 g Oral BID  . potassium chloride  10 mEq Oral Daily   Infusions: . 0.9 % NaCl with KCl 20 mEq / L 100 mL/hr at 04/02/15 2351   PRN Meds: acetaminophen **OR** acetaminophen, alum &  mag hydroxide-simeth, diphenhydrAMINE, HYDROmorphone (DILAUDID) injection, LORazepam, menthol-cetylpyridinium **OR** phenol, metoCLOPramide **OR** metoCLOPramide (REGLAN) injection, ondansetron **OR** ondansetron (ZOFRAN) IV, oxyCODONE   Allergies as of 01/19/2015  . (No Known Allergies)    Family History  Problem Relation Age of Onset  . Heart attack Father 100  . Ovarian cancer Mother     Social History   Social History  . Marital Status: Married    Spouse Name: N/A  . Number of Children: N/A  . Years of Education: N/A   Occupational History  . Not on file.   Social History Main Topics  . Smoking status: Former Smoker    Types: Pipe  . Smokeless tobacco: Never Used     Comment: Quit 40 year  . Alcohol Use: 2.0 oz/week    4 Standard drinks or equivalent per week  . Drug Use: No  . Sexual Activity: Not on file   Other Topics Concern  . Not on file   Social History Narrative    REVIEW OF SYSTEMS: Constitutional:  Stable weight.  No  fatigue ENT:  No nose bleeds Pulm:  No cough or dyspnea CV:  No palpitations, no LE edema.  GU:  No hematuria, no frequency GI:  Per HPI.  No constipation or bpr. Heme:  No issues with excessive bleeding or bruising   Transfusions:  none Neuro:  No headaches, no peripheral tingling or numbness Derm:  Hx skin cancers, up to date with derm exams.  Endocrine:  No sweats or chills.  No polyuria or dysuria Immunization:  Not queried Travel:  None beyond local counties in last few months.    PHYSICAL EXAM: Vital signs in last 24 hours: Filed Vitals:   04/03/15 0618  BP: 117/48  Pulse: 74  Temp: 97.8 F (36.6 C)  Resp: 16   Wt Readings from Last 3 Encounters:  04/02/15 193 lb (87.544 kg)  03/20/15 193 lb 1.6 oz (87.59 kg)  05/04/14 190 lb 12.8 oz (86.546 kg)    General: pleasant.  Looks well and comfortable Head:  No swelling or asymmetry  Eyes:  No icterus or pallor Ears:  Not HOOH  Nose:  No congestion or discharge Mouth:  Clear and moist, good teeth Neck:  No mass or JVD.  No TMG Lungs:  Clear bil in front, no cough or dyspnea Heart: RRR.  No MRG Abdomen:  Soft, NT, ND.  S1/s2 audible.   Rectal: not done   Musc/Skeltl: right leg wrapped.  Extremities:  No CCE  Neurologic:  Oriented x 3.  No tremor.  No gross deficits.  Fully alert Skin:  No rash or telangectasia.  Tattoos:  none Nodes:  No cervical adenopathy   Psych:  Pleasant, calm, in good spirits.   Intake/Output from previous day: 11/07 0701 - 11/08 0700 In: 1100 [I.V.:1100] Out: 1550 [Urine:1350; Drains:175; Blood:25] Intake/Output this shift:    LAB RESULTS:  Recent Labs  04/03/15 0422  WBC 17.1*  HGB 11.2*  HCT 34.4*  PLT 153   BMET Lab Results  Component Value Date   NA 136 04/03/2015   NA 138 03/20/2015   NA 142 08/21/2014   K 4.8 04/03/2015   K 4.4 03/20/2015   K 4.1 08/21/2014   CL 104 04/03/2015   CL 104 03/20/2015   CL 104 08/21/2014   CO2 24 04/03/2015   CO2 28 03/20/2015    CO2 27 08/21/2014   GLUCOSE 147* 04/03/2015   GLUCOSE 96 03/20/2015   GLUCOSE 92 08/21/2014  BUN 24* 04/03/2015   BUN 21* 03/20/2015   BUN 18 08/21/2014   CREATININE 0.85 04/03/2015   CREATININE 0.97 03/20/2015   CREATININE 0.90 08/21/2014   CALCIUM 8.7* 04/03/2015   CALCIUM 10.1 03/20/2015   CALCIUM 10.0 08/21/2014   LFT No results for input(s): PROT, ALBUMIN, AST, ALT, ALKPHOS, BILITOT, BILIDIR, IBILI in the last 72 hours. PT/INR Lab Results  Component Value Date   INR 1.10 03/20/2015   INR 1.03 08/21/2014   Hepatitis Panel No results for input(s): HEPBSAG, HCVAB, HEPAIGM, HEPBIGM in the last 72 hours. C-Diff No components found for: CDIFF Lipase  No results found for: LIPASE      RADIOLOGY STUDIES: No results found.  ENDOSCOPIC STUDIES: Colonoscopies in 2009 and ~ 01/2015.  Pathology 2009.  Left colon polyp: tubular adenoma, no HGD.   IMPRESSION:    *  CG emesis.   Suspect esophagitis, gastritis,    *  ABL anemia.  No where near requiring blood transfusion.   *  S/p 11/7 right TKR      PLAN:     *  Start bid iv Protonix.   Set EGD for tomorrow. Wife is Theatre manager [phone].    Azucena Freed  04/03/2015, 8:29 AM Pager: [phone]   Attending Addendum I have taken an interval history, reviewed the chart, and examined the patient. I agree with the Advanced Practitioner's note and impression. Patient is s/p R TKA recently and developed a few episodes of coffee ground emesis leading to admission. He has not had further episodes or evidence of melena since admission, but did have a drop in H/H. On IV PPI now. Discussed recommendation for upper endoscopy to clarify the etiology and risk stratify, and treat high risk lesion if needed. Will rule out PUD, esophagitis, gastritis, etc. Following discussion of risks / benefits he wished to proceed. Set for EGD tomorrow AM. Clear liquids in the interim and NPO after midnight. If symptoms recur or worsen in the  interim please contact us for reassessment. Hemodynamically stable, await repeat H/H.   [place] Cellar, MD [facility] Pager [phone]

## 2015-04-04 NOTE — Progress Notes (Signed)
Subjective: 2 Days Post-Op Procedure(s) (LRB): TOTAL KNEE ARTHROPLASTY (Right) Patient reports pain as 3 on 0-10 scale.    Objective: Vital signs in last 24 hours: Temp:  [97.9 F (36.6 C)-98.1 F (36.7 C)] 98 F (36.7 C) (11/09 0700) Pulse Rate:  [73-78] 78 (11/09 0700) Resp:  [18] 18 (11/09 0700) BP: (107-135)/(50-57) 135/57 mmHg (11/09 0700) SpO2:  [93 %-95 %] 94 % (11/09 0700)  Intake/Output from previous day: 11/08 0701 - 11/09 0700 In: 120 [P.O.:120] Out: 750 [Urine:750] Intake/Output this shift: Total I/O In: -  Out: 300 [Urine:300]   Recent Labs  04/03/15 0422 04/04/15 0608  HGB 11.2* 9.4*    Recent Labs  04/03/15 0422 04/04/15 0608  WBC 17.1* 16.5*  RBC 3.77* 3.14*  HCT 34.4* 28.8*  PLT 153 142*    Recent Labs  04/03/15 0422 04/04/15 0608  NA 136 136  K 4.8 4.8  CL 104 103  CO2 24 29  BUN 24* 19  CREATININE 0.85 0.90  GLUCOSE 147* 110*  CALCIUM 8.7* 9.2   No results for input(s): LABPT, INR in the last 72 hours.  ABD soft Neurovascular intact Sensation intact distally Intact pulses distally Dorsiflexion/Plantar flexion intact Incision: moderate drainage  Assessment/Plan: 2 Days Post-Op Procedure(s) (LRB): TOTAL KNEE ARTHROPLASTY (Right)  Principal Problem:   Primary localized osteoarthritis of right knee Active Problems:   Aortic valve stenosis, mild   Hyperlipidemia   DJD (degenerative joint disease) of knee   Low O2 saturation   Upper GI bleed  Advance diet Up with therapy  EGD today and chest xray today.  New dressing applied with extra reinforcement.  Rosely Fernandez J 04/04/2015, 9:31 AM

## 2015-04-04 NOTE — Progress Notes (Signed)
PT Cancellation Note  Patient Details Name: Cadin Luka MRN: [id number] DOB: 11-11-1932   Cancelled Treatment:    Reason Eval/Treat Not Completed: Patient at procedure or test/unavailable. Checking on patient X2, in am at procedure and in afternoon again gone. Talked with patient's spouse, will continue to follow and progress mobility as tolerated.    Cassell Clement, PT, CSCS Pager [phone] Office [phone]  04/04/2015, 3:29 PM

## 2015-04-04 NOTE — Anesthesia Preprocedure Evaluation (Signed)
Anesthesia Evaluation  Patient identified by MRN, date of birth, ID band Patient awake    Reviewed: Allergy & Precautions, NPO status , Patient's Chart, lab work & pertinent test results  History of Anesthesia Complications Negative for: history of anesthetic complications  Airway Mallampati: II  TM Distance: >3 FB Neck ROM: Full    Dental  (+) Teeth Intact   Pulmonary neg shortness of breath, neg sleep apnea, neg recent URI, former smoker, neg PE   breath sounds clear to auscultation       Cardiovascular + Valvular Problems/Murmurs AS  Rhythm:Regular     Neuro/Psych negative neurological ROS  negative psych ROS   GI/Hepatic negative GI ROS, Neg liver ROS,   Endo/Other  negative endocrine ROS  Renal/GU negative Renal ROS     Musculoskeletal  (+) Arthritis ,   Abdominal   Peds  Hematology negative hematology ROS (+)   Anesthesia Other Findings   Reproductive/Obstetrics                             Anesthesia Physical Anesthesia Plan  ASA: II  Anesthesia Plan: MAC   Post-op Pain Management:    Induction: Intravenous  Airway Management Planned: Nasal Cannula and Natural Airway  Additional Equipment: None  Intra-op Plan:   Post-operative Plan:   Informed Consent: I have reviewed the patients History and Physical, chart, labs and discussed the procedure including the risks, benefits and alternatives for the proposed anesthesia with the patient or authorized representative who has indicated his/her understanding and acceptance.   Dental advisory given  Plan Discussed with: CRNA and Surgeon  Anesthesia Plan Comments:         Anesthesia Quick Evaluation

## 2015-04-04 NOTE — Op Note (Signed)
[facility] [place], [address]   ENDOSCOPY PROCEDURE REPORT  PATIENT: Eugene [name], Eugene [name]  MR#: [id number] BIRTHDATE: [birth year] , 82  yrs. old GENDER: male ENDOSCOPIST: Yetta Flock, MD REFERRED BY: PROCEDURE DATE:  04/04/2015 PROCEDURE:  EGD w/ biopsy ASA CLASS:     Class III INDICATIONS:  hematemesis, anemia. MEDICATIONS: Per Anesthesia TOPICAL ANESTHETIC: Cetacaine Spray  DESCRIPTION OF PROCEDURE: After the risks benefits and alternatives of the procedure were thoroughly explained, informed consent was obtained.  The Pentax Gastroscope [id number] endoscope was introduced through the mouth and advanced to the second portion of the duodenum , Without limitations.  The instrument was slowly withdrawn as the mucosa was fully examined.   FINDINGS:The mid and distal esophagus were remarkable for severe esophagitis.  There was no high risk stigmata of bleeding noted, but ulceration was severe and diffuse in the lower esophagus and patchy in the mid esophagus.  Biopsies were taken to rule out viral cytopathic effect.  DH noted at 40cm from the incisors, with GEJ and SCJ noted around 36cm from the incisors, with a 4-5cm hiatal hernia.  The stomach was normal in appearance without focal ulcerations or inflammatory changes.  The duodenal bulb and 2nd portion of the duodenum were otherwise normal.  Retroflexed views revealed no abnormalities.     The scope was then withdrawn from the patient and the procedure completed.  COMPLICATIONS: There were no immediate complications.  ENDOSCOPIC IMPRESSION: Severe esophagitis - suspect most likely erosive vs. less likely viral (HSV, CMV) 4-5cm hiatal hernia Normal stomach and duodenum   RECOMMENDATIONS: Return to hospital ward Protonix 40mg  by mouth twice daily Liquid carafate 10cc  po q 6 hours Trend CBC Liquid diet okay as tolerated Await pathology results Serum HSV, CMV PCR    eSigned:   Yetta Flock, MD 04/04/2015 10:19 AM    CC:  PATIENT NAME:  Abdulahi, Schor MR#: [id number]

## 2015-04-04 NOTE — Interval H&P Note (Signed)
History and Physical Interval Note:  04/04/2015 9:49 AM  Eugene [name]  has presented today for surgery, with the diagnosis of coffee ground emesis  The various methods of treatment have been discussed with the patient and family. After consideration of risks, benefits and other options for treatment, the patient has consented to  Procedure(s): ESOPHAGOGASTRODUODENOSCOPY (EGD) (N/A) as a surgical intervention .  The patient's history has been reviewed, patient examined, no change in status, stable for surgery.  I have reviewed the patient's chart and labs.  Questions were answered to the patient's satisfaction.     Renelda Loma Kelcey Korus

## 2015-04-04 NOTE — Progress Notes (Signed)
OT Cancellation Note    04/04/15 1033  OT Visit Information  Reason Eval/Treat Not Completed Patient at procedure or test/ unavailable  Pt currently at Upper Endoscopy. Will check back for OT session as time allows and pt is appropriate.   Truman Hayward, M.S., OTR/L Pager: [phone] 04/04/15, 10:35 AM

## 2015-04-04 NOTE — Transfer of Care (Signed)
Immediate Anesthesia Transfer of Care Note  Patient: Eugene [name]  Procedure(s) Performed: Procedure(s): ESOPHAGOGASTRODUODENOSCOPY (EGD) (N/A)  Patient Location: PACU and Endoscopy Unit  Anesthesia Type:MAC  Level of Consciousness: awake, alert , oriented and patient cooperative  Airway & Oxygen Therapy: Patient Spontanous Breathing and Patient connected to nasal cannula oxygen  Post-op Assessment: Report given to RN, Post -op Vital signs reviewed and stable and Patient moving all extremities  Post vital signs: Reviewed and stable  Last Vitals:  Filed Vitals:   04/04/15 1018  BP: 124/53  Pulse: 66  Temp:   Resp: 16    Complications: No apparent anesthesia complications

## 2015-04-05 ENCOUNTER — Encounter (HOSPITAL_COMMUNITY): Payer: Self-pay | Admitting: Gastroenterology

## 2015-04-05 DIAGNOSIS — J9601 Acute respiratory failure with hypoxia: Secondary | ICD-10-CM | POA: Insufficient documentation

## 2015-04-05 DIAGNOSIS — K208 Other esophagitis: Secondary | ICD-10-CM

## 2015-04-05 DIAGNOSIS — R918 Other nonspecific abnormal finding of lung field: Secondary | ICD-10-CM | POA: Insufficient documentation

## 2015-04-05 DIAGNOSIS — D869 Sarcoidosis, unspecified: Secondary | ICD-10-CM | POA: Insufficient documentation

## 2015-04-05 LAB — CBC
HEMATOCRIT: 26.5 % — AB (ref 39.0–52.0)
HEMOGLOBIN: 8.7 g/dL — AB (ref 13.0–17.0)
MCH: 30.4 pg (ref 26.0–34.0)
MCHC: 32.8 g/dL (ref 30.0–36.0)
MCV: 92.7 fL (ref 78.0–100.0)
Platelets: 131 10*3/uL — ABNORMAL LOW (ref 150–400)
RBC: 2.86 MIL/uL — ABNORMAL LOW (ref 4.22–5.81)
RDW: 14.1 % (ref 11.5–15.5)
WBC: 10.8 10*3/uL — ABNORMAL HIGH (ref 4.0–10.5)

## 2015-04-05 LAB — BASIC METABOLIC PANEL
Anion gap: 4 — ABNORMAL LOW (ref 5–15)
BUN: 14 mg/dL (ref 6–20)
CHLORIDE: 97 mmol/L — AB (ref 101–111)
CO2: 31 mmol/L (ref 22–32)
CREATININE: 1.02 mg/dL (ref 0.61–1.24)
Calcium: 8.9 mg/dL (ref 8.9–10.3)
GFR calc Af Amer: >60 mL/min (ref >60)
GFR calc non Af Amer: >60 mL/min (ref >60)
GLUCOSE: 103 mg/dL — AB (ref 65–99)
Potassium: 4.1 mmol/L (ref 3.5–5.1)
SODIUM: 132 mmol/L — AB (ref 135–145)

## 2015-04-05 LAB — APTT: aPTT: 34 seconds (ref 24–37)

## 2015-04-05 LAB — PROTIME-INR
INR: 1.19 (ref 0.00–1.49)
Prothrombin Time: 15.3 seconds — ABNORMAL HIGH (ref 11.6–15.2)

## 2015-04-05 LAB — PLATELET FUNCTION ASSAY: COLLAGEN / EPINEPHRINE: 118 s (ref 0–193)

## 2015-04-05 MED ORDER — ENSURE ENLIVE PO LIQD
237.0000 mL | Freq: Two times a day (BID) | ORAL | Status: DC
  Administered 2015-04-05 – 2015-04-06 (×2): 237 mL via ORAL

## 2015-04-05 MED ORDER — AMOXICILLIN-POT CLAVULANATE 875-125 MG PO TABS
1.0000 | ORAL_TABLET | Freq: Two times a day (BID) | ORAL | Status: DC
  Filled 2015-04-05: qty 1

## 2015-04-05 MED ORDER — GLYCERIN (LAXATIVE) 2.1 G RE SUPP
1.0000 | Freq: Every day | RECTAL | Status: DC
  Administered 2015-04-05: 1 via RECTAL
  Filled 2015-04-05 (×2): qty 1

## 2015-04-05 NOTE — Progress Notes (Signed)
Subjective: 3 days Post OP Right total knee 1 Day Post-Op Procedure(s) (LRB): ESOPHAGOGASTRODUODENOSCOPY (EGD) (N/A) Patient reports pain as 3 on 0-10 scale.    Objective: Vital signs in last 24 hours: Temp:  [97.6 F (36.4 C)-98.4 F (36.9 C)] 98.4 F (36.9 C) (11/10 0438) Pulse Rate:  [66-88] 88 (11/10 0438) Resp:  [12-18] 18 (11/10 0438) BP: (114-127)/(44-56) 122/52 mmHg (11/10 0438) SpO2:  [91 %-99 %] 92 % (11/10 0438)  Intake/Output from previous day: 11/09 0701 - 11/10 0700 In: 720 [P.O.:720] Out: 600 [Urine:600] Intake/Output this shift:     Recent Labs  04/03/15 0422 04/04/15 0608 04/05/15 0736  HGB 11.2* 9.4* 8.7*    Recent Labs  04/04/15 0608 04/05/15 0736  WBC 16.5* 10.8*  RBC 3.14* 2.86*  HCT 28.8* 26.5*  PLT 142* 131*    Recent Labs  04/04/15 0608 04/05/15 0736  NA 136 132*  K 4.8 4.1  CL 103 97*  CO2 29 31  BUN 19 14  CREATININE 0.90 1.02  GLUCOSE 110* 103*  CALCIUM 9.2 8.9   No results for input(s): LABPT, INR in the last 72 hours.  ABD soft Neurovascular intact Sensation intact distally Incision: moderate drainage   CT shows loculated right lower lobe pleural effusion EGD shows severe esophagitis    Assessment/Plan: 1 Day Post-Op Procedure(s) (LRB): ESOPHAGOGASTRODUODENOSCOPY (EGD) (N/A)  Principal Problem:   Primary localized osteoarthritis of right knee Active Problems:   Aortic valve stenosis, mild   Hyperlipidemia   DJD (degenerative joint disease) of knee   Low O2 saturation   Upper GI bleed   Acute esophagitis  Advance diet Up with therapy  Will use SCD and ted hose for DVT prophylaxis.  No chemoprophylaxis while acutely bleeding. Pulmonary to see him today in the setting of his low O2 sat, unilateral loculated plural effusion and sarcoidosis. Will change dressing again today.  Plan for D/C over the weekend or Monday when medically stable  Ameya Kutz J 04/05/2015, 9:34 AM

## 2015-04-05 NOTE — Progress Notes (Addendum)
Physical Therapy Treatment Patient Details Name: Eugene [name] MRN: [id number] DOB: 1932-12-31 Today's Date: 04/05/2015    History of Present Illness 81 y.o. male s/p Rt TKA. PMH: sarcoidosis.    PT Comments    Progressing well toward mobility goals as evident by ambulating 100 ft with Min Guard while demonstrating increased trunk ext and improved knee flexion to 95 degrees. No c/o pain post tx this am. Continue with current PT goals with anticipation of returning home with home health.  Follow Up Recommendations  Home health PT     Equipment Recommendations  None recommended by PT    Recommendations for Other Services       Precautions / Restrictions Precautions Precautions: Knee;Fall Restrictions Weight Bearing Restrictions: Yes RLE Weight Bearing: Weight bearing as tolerated    Mobility  Bed Mobility               General bed mobility comments: up in chair upon arrival  Transfers Overall transfer level: Needs assistance Equipment used: Rolling walker (2 wheeled) Transfers: Sit to/from [employer] Transfers Sit to Stand: Min guard         General transfer comment: carry over of sequencing of gait with AD and hand placement with no vc needed  Ambulation/Gait Ambulation/Gait assistance: Min guard Ambulation Distance (Feet): 100 Feet Assistive device: Rolling walker (2 wheeled) Gait Pattern/deviations: Antalgic;Decreased stance time - right;Step-through pattern Gait velocity: decreased   General Gait Details: 2L O2 with decreased cadenece and stride length; improved trunk ext and vc for even B step length   Stairs            Wheelchair Mobility    Modified Rankin (Stroke Patients Only)       Balance Overall balance assessment: Needs assistance Sitting-balance support: No upper extremity supported Sitting balance-Leahy Scale: Good     Standing balance support: Bilateral upper extremity supported Standing balance-Leahy Scale:  Poor Standing balance comment: RW for UE support                    Cognition Arousal/Alertness: Awake/alert Behavior During Therapy: WFL for tasks assessed/performed Overall Cognitive Status: Within Functional Limits for tasks assessed                      Exercises Total Joint Exercises Quad Sets: AROM;20 reps;Strengthening Short Arc Quad: AROM;Strengthening;Right;20 reps;Seated Heel Slides: Right;Strengthening;AROM;20 reps Hip ABduction/ADduction: AROM;20 reps;Strengthening Goniometric ROM: 95 degrees flexion    General Comments        Pertinent Vitals/Pain Pain Assessment: No/denies pain    Home Living                      Prior Function            PT Goals (current goals can now be found in the care plan section) Acute Rehab PT Goals Patient Stated Goal: Be able to go home PT Goal Formulation: With patient Time For Goal Achievement: 04/16/15 Potential to Achieve Goals: Good Progress towards PT goals: Progressing toward goals    Frequency  7X/week    PT Plan Current plan remains appropriate    Co-evaluation             End of Session Equipment Utilized During Treatment: Gait belt;Oxygen Activity Tolerance: Patient tolerated treatment well Patient left: in chair;with call bell/phone within reach;with SCD's reapplied;with family/visitor present     Time: 1420-1440 PT Time Calculation (min) (ACUTE ONLY): 20 min  Charges:  $Gait Training:  8-22 mins                    G CodesDarliss Cheney PTA [phone] 04/05/2015, 4:04 PM

## 2015-04-05 NOTE — Care Management Important Message (Signed)
Important Message  Patient Details  Name: Eugene [name] MRN: [id number] Date of Birth: 1932-07-24   Medicare Important Message Given:  Yes    [name] 04/05/2015, 3:24 PM

## 2015-04-05 NOTE — Progress Notes (Deleted)
Physical Therapy Treatment Patient Details Name: Eugene [name] MRN: [id number] DOB: 1932/11/07 Today's Date: 04/05/2015    History of Present Illness      PT Comments    Pt progressing toward mobility goals as evident by increased activity tolerance and ability to ambulate 100 ft with min guard assistance with 2L O2 with no c/o of increased pain. Continue progressing mobility as tolerated with plan of D/C home with home health.  Follow Up Recommendations        Equipment Recommendations       Recommendations for Other Services       Precautions / Restrictions      Mobility  Bed Mobility                  Transfers                    Ambulation/Gait                 Stairs            Wheelchair Mobility    Modified Rankin (Stroke Patients Only)       Balance                                    Cognition                            Exercises      General Comments        Pertinent Vitals/Pain      Home Living                      Prior Function            PT Goals (current goals can now be found in the care plan section)      Frequency       PT Plan      Co-evaluation             End of Session           Time:  -     Charges:                       G CodesDarliss Cheney PTA [phone] 04/05/2015, 1:14 PM

## 2015-04-05 NOTE — Anesthesia Postprocedure Evaluation (Signed)
  Anesthesia Post-op Note  Patient: Eugene [name]  Procedure(s) Performed: Procedure(s): ESOPHAGOGASTRODUODENOSCOPY (EGD) (N/A)  Patient Location: Endoscopy Unit  Anesthesia Type:MAC  Level of Consciousness: awake  Airway and Oxygen Therapy: Patient Spontanous Breathing  Post-op Pain: none  Post-op Assessment: Post-op Vital signs reviewed, Patient's Cardiovascular Status Stable, Respiratory Function Stable, Patent Airway, No signs of Nausea or vomiting and Pain level controlled LLE Motor Response: Purposeful movement, Responds to commands LLE Sensation: Full sensation RLE Motor Response: Responds to commands, Responds to sound, Purposeful movement RLE Sensation: Full sensation L Sensory Level: S1-Sole of foot, small toes R Sensory Level: S1-Sole of foot, small toes  Post-op Vital Signs: Reviewed  Last Vitals:  Filed Vitals:   04/05/15 0438  BP: 122/52  Pulse: 88  Temp: 36.9 C  Resp: 18    Complications: No apparent anesthesia complications

## 2015-04-05 NOTE — Progress Notes (Signed)
OT Cancellation Note  Patient Details Name: Eugene [name] MRN: [id number] DOB: 1932/10/09   Cancelled Treatment:    Reason Eval/Treat Not Completed: Other (comment) (Pt resting, family requesting OT come back tomorrow). Family reports that pt just got back in bed and CPM; pt asleep upon arrival and family is requesting that therapy comes back tomorrow to let pt rest right now. Will check back for OT session as time allows.  Binnie Kand M.S., OTR/L Pager: [phone]  04/05/2015, 3:44 PM

## 2015-04-05 NOTE — Consult Note (Signed)
Name: Eugene [name] MRN: [id number] DOB: 1932-09-02    ADMISSION DATE:  04/02/2015 CONSULTATION DATE:  04/05/2015  REFERRING MD :  Noemi Chapel  CHIEF COMPLAINT:  Hypoxia  BRIEF PATIENT DESCRIPTION: 81 year old male with remote hx sarcoidosis who presented to [facility] 11/7 for total R knee arthoplasty. Hospital course complicated by Upper GI bleed and hypoxia post operatively. CT chest 11/9 with loculated R effusion and consolidation. PCCM asked to see for further eval  SIGNIFICANT EVENTS  11/7 admit for R total Knee 11/9 Upper EGD > severe esophagitis, 4-5cm hiatal hernia   STUDIES:  CTA chest (11/9) - small loculated R pleural effusion with posterior R base consolidation. Patchy atelectasis in both lung bases as well as in the inferior lingula.   HISTORY OF PRESENT ILLNESS:  81 year old male with past medical history as below, which includes sarcoidosis, hyperlipidemia, BPH, osteoarthritis. He reports being diagnosed with PNA about 4 weeks ago. Was prescribed antibiotics by PCP and symptoms (cough) resolved.  Also has a history of right knee pain that is failed conservative treatment, and for that reason he presented to [facility] 11/7 for total right knee arthroplasty under Dr. Noemi Chapel. The procedure was without complication. The postoperative period was complicated by 2 episodes of coffee-ground emesis and a subsequent drop in hemoglobin. Gastroenterology was consulted. He was set up for EGD the 11/9 which found severe esophagitis and hiatal hernia. Medical managment recommended. Patient also noted to have low 02 sats. On room air reportedly desats into 80s.  CXR and CTA chest done and show RLL opacification concerning fro consolidation and possibly effusion. PCCM consulted for further evaluation. Currently he denies cough, SOB, DOE, pleuritic chest pain, fever/chills.   PAST MEDICAL HISTORY :   has a past medical history of Hyperlipidemia; Sarcoidosis ([place]); BPH (benign prostatic  hyperplasia); Incomplete right bundle branch block; History of kidney stones; Hernia of abdominal cavity; Bladder stones; Primary localized osteoarthritis of right knee (04/02/2015); Low O2 saturation (04/04/2015); and Upper GI bleed (04/04/2015).  has past surgical history that includes Cardiovascular stress test (04/29/2012); doppler echocardiography (04/29/2012); Anterior cervical decomp/discectomy fusion (2007); Bronchoscopy (2007); Inguinal hernia repair (Right, 2009); Cystoscopy (2004); Total knee arthroplasty (Right, 04/02/2015); Esophagogastroduodenoscopy (04/04/2015); and Esophagogastroduodenoscopy (N/A, 04/04/2015). Prior to Admission medications   Medication Sig Start Date End Date Taking? Authorizing Provider  aspirin 81 MG tablet Take 81 mg by mouth daily.   Yes Historical Provider, MD  finasteride (PROSCAR) 5 MG tablet Take 5 mg by mouth every evening.    Yes Historical Provider, MD  potassium citrate (UROCIT-K) 10 MEQ (1080 MG) SR tablet Take 1 tablet by mouth daily. 03/07/13  Yes Historical Provider, MD  VYTORIN 10-40 MG per tablet TAKE (1) TABLET DAILY AT BEDTIME. 07/03/14  Yes Troy Sine, MD   No Known Allergies  FAMILY HISTORY:  family history includes Heart attack (age of onset: 82) in his father; Ovarian cancer in his mother. SOCIAL HISTORY:  reports that he has quit smoking. His smoking use included Pipe. He has never used smokeless tobacco. He reports that he drinks about 2.0 oz of alcohol per week. He reports that he does not use illicit drugs.  REVIEW OF SYSTEMS:   Bolds are positive  Constitutional: weight loss, gain, night sweats, Fevers, chills, fatigue .  HEENT: headaches, Sore throat, sneezing, nasal congestion, post nasal drip, Difficulty swallowing, Tooth/dental problems, visual complaints visual changes, ear ache CV:  chest pain, radiates: ,Orthopnea, PND, swelling in lower extremities, dizziness, palpitations, syncope.  GI  heartburn, indigestion, abdominal pain,  nausea, vomiting, diarrhea, change in bowel habits, loss of appetite, bloody stools.  Resp: cough, productive: , hemoptysis, dyspnea, chest pain, pleuritic.  Skin: rash or itching or icterus GU: dysuria, change in color of urine, urgency or frequency. flank pain, hematuria  MS: R Knee pain, stiffness or swelling. decreased range of motion  Psych: change in mood or affect. depression or anxiety.  Neuro: difficulty with speech, weakness, numbness, ataxia    SUBJECTIVE:   VITAL SIGNS: Temp:  [97.6 F (36.4 C)-98.4 F (36.9 C)] 98.4 F (36.9 C) (11/10 0438) Pulse Rate:  [66-88] 88 (11/10 0438) Resp:  [12-18] 18 (11/10 0438) BP: (114-127)/(52-56) 122/52 mmHg (11/10 0438) SpO2:  [91 %-97 %] 92 % (11/10 0438)  PHYSICAL EXAMINATION: General:  Elderly male in NAD Neuro:  Alert, oriented, non-focal HEENT:  [place]/AT, no appreciable JVD Cardiovascular:  RRR, 2/6 SEM LSB 2nd ICS Lungs:  Diminished R base Abdomen:  Soft, non-tender, non-distended Musculoskeletal: No acute deformity Skin:  Surgical wound to R knee   Recent Labs Lab 04/03/15 0422 04/04/15 0608 04/05/15 0736  NA 136 136 132*  K 4.8 4.8 4.1  CL 104 103 97*  CO2 24 29 31   BUN 24* 19 14  CREATININE 0.85 0.90 1.02  GLUCOSE 147* 110* 103*    Recent Labs Lab 04/03/15 0422 04/04/15 0608 04/05/15 0736  HGB 11.2* 9.4* 8.7*  HCT 34.4* 28.8* 26.5*  WBC 17.1* 16.5* 10.8*  PLT 153 142* 131*   Dg Chest 2 View  04/04/2015  CLINICAL DATA:  Sudden decrease in O2 sats today. Mild SOB. EXAM: CHEST  2 VIEW COMPARISON:  01/01/2009 FINDINGS: Motion degraded AP. Lateral view degraded by patient arm position. Lower cervical spine fixation. Moderate cardiomegaly. Mild right hemidiaphragm elevation. No definite pleural fluid. No pneumothorax. No congestive failure. Clear left lung. Right opacity obscuring the elevated right hemidiaphragm. IMPRESSION: New right hemidiaphragm elevation compared to 2009. Adjacent airspace disease could  represent atelectasis or infection. Cardiomegaly without congestive failure. Both views are degraded, as detailed above. Electronically Signed   By: Abigail Miyamoto M.D.   On: 04/04/2015 11:48   Ct Angio Chest Pe W/cm &/or Wo Cm  04/04/2015  CLINICAL DATA:  Recent total knee replacement, now with chest discomfort and infiltrate seen on chest radiograph EXAM: CT ANGIOGRAPHY CHEST WITH CONTRAST TECHNIQUE: Multidetector CT imaging of the chest was performed using the standard protocol during bolus administration of intravenous contrast. Multiplanar CT image reconstructions and MIPs were obtained to evaluate the vascular anatomy. CONTRAST:  158mL OMNIPAQUE IOHEXOL 350 MG/ML SOLN COMPARISON:  Chest CT September 04, 2005; chest radiograph April 04, 2015. FINDINGS: There is no demonstrable pulmonary embolus. There is no thoracic aortic aneurysm or dissection. Visualized great vessels appear unremarkable. There is a small loculated right pleural effusion with patchy consolidation in the right base. There is atelectatic change in both lung bases, slightly more on the left than on the right. There is also patchy atelectasis in the inferior lingula. Thyroid appears unremarkable. There is no appreciable thoracic adenopathy. There are foci of coronary artery calcification. The pericardium is not thickened. There is a focal hiatal hernia. In the visualized upper abdomen, there is a cyst arising from the posterior aspect of the upper pole the left kidney measuring 4.5 x 3.8 cm. Visualized upper abdominal structures otherwise appear unremarkable. There is degenerative change in the thoracic spine. There are no blastic or lytic bone lesions. There is postoperative change in the lower cervical spine region.  Review of the MIP images confirms the above findings. IMPRESSION: No demonstrable pulmonary embolus. Small loculated right pleural effusion with posterior right base consolidation. Patchy atelectasis in both lung bases as well as in  the inferior lingula. No adenopathy. Foci of coronary artery calcification. Fairly small focal hiatal hernia. Electronically Signed   By: Lowella Grip III M.D.   On: 04/04/2015 15:59    ASSESSMENT / PLAN:  RLL consolidation CAP vs ATX, also consider aspiration Small R pleural effusion, appears loculated on CT Hypoxia - Supplemental O2 as needed to keep SpO2 > 92% - Will evaluate effusion with [place] to consider diagnostic thoracentesis - Defer ABX for now  - Aggressive IS, FV - Assess PCT  Upper GI bleed - GI following  S/p total R knee arthoplasty - Ortho primary   Georgann Housekeeper, AGACNP-BC [facility] Pulmonology/Critical Care Pager [phone] or [phone]  04/05/2015 11:10 AM

## 2015-04-05 NOTE — Progress Notes (Signed)
Physical Therapy Treatment Patient Details Name: Eugene [name] MRN: [id number] DOB: [birth year] Today's Date: 04/05/2015    History of Present Illness 81 y.o. male s/p Rt TKA. PMH: sarcoidosis.    PT Comments    Progressing toward mobility goals as evident by ambulating 100 ft with Min Guard and minimal cues required for functional transfers. Continue progressing toward PT goals with anticipation for returning home with home health.  Follow Up Recommendations  Home health PT;Supervision for mobility/OOB     Equipment Recommendations  None recommended by PT    Recommendations for Other Services       Precautions / Restrictions Precautions Precautions: Knee;Fall Required Braces or Orthoses: Knee Immobilizer - Right Restrictions Weight Bearing Restrictions: Yes RLE Weight Bearing: Weight bearing as tolerated    Mobility  Bed Mobility               General bed mobility comments: up in chair upon arrival  Transfers Overall transfer level: Needs assistance Equipment used: Rolling walker (2 wheeled) Transfers: Sit to/from [employer] Transfers Sit to Stand: Min guard         General transfer comment: no cues needed for hand position  Ambulation/Gait Ambulation/Gait assistance: Min guard Ambulation Distance (Feet): 100 Feet Assistive device: Rolling walker (2 wheeled) Gait Pattern/deviations: Decreased stance time - right;Antalgic;Decreased weight shift to right Gait velocity: decreased   General Gait Details: 2L O2 with decreased cadenece and stride length and decreased R knee ext   Stairs            Wheelchair Mobility    Modified Rankin (Stroke Patients Only)       Balance Overall balance assessment: Needs assistance Sitting-balance support: No upper extremity supported Sitting balance-Leahy Scale: Good     Standing balance support: Bilateral upper extremity supported Standing balance-Leahy Scale: Poor Standing balance comment: using  RW                    Cognition Arousal/Alertness: Awake/alert (drowsy when sitting) Behavior During Therapy: WFL for tasks assessed/performed Overall Cognitive Status: Within Functional Limits for tasks assessed                      Exercises Total Joint Exercises Ankle Circles/Pumps: AROM Quad Sets: Right;10 reps;Strengthening Heel Slides: AAROM;Right;10 reps;Strengthening      General Comments        Pertinent Vitals/Pain Pain Assessment: 0-10 Pain Score: 0-No pain    Home Living                      Prior Function            PT Goals (current goals can now be found in the care plan section) Acute Rehab PT Goals Patient Stated Goal: Be able to go home PT Goal Formulation: With patient Time For Goal Achievement: 04/16/15 Potential to Achieve Goals: Good Progress towards PT goals: Progressing toward goals    Frequency  7X/week    PT Plan Current plan remains appropriate    Co-evaluation             End of Session Equipment Utilized During Treatment: Gait belt;Oxygen Activity Tolerance: Patient tolerated treatment well Patient left: in chair;with call bell/phone within reach;with SCD's reapplied;with family/visitor present     Time: 1013-1040 PT Time Calculation (min) (ACUTE ONLY): 27 min  Charges:  $Gait Training: 8-22 mins  TE: 8-59mins        G CodesDarliss Cheney PTA [phone] 04/05/2015, 4:49 PM

## 2015-04-05 NOTE — Progress Notes (Signed)
[facility] Progress Note  Subjective:  Hgb 8.7.On protonix and carafate.NO nausea this morning. No vomiting. Has not moved bowels since Sunday.EGD yesterday: ENDOSCOPIC IMPRESSION: Severe esophagitis - suspect most likely erosive vs. less likely viral (HSV, CMV) 4-5cm hiatal hernia Normal stomach and duodenum Page 1 of 2 RECOMMENDATIONS: Return to hospital ward Protonix 40mg  by mouth twice daily Liquid carafate 10cc po q 6 hours Trend CBC Liquid diet okay as tolerated Await pathology results  Objective:  Vital signs in last 24 hours: Temp:  [97.6 F (36.4 C)-98.4 F (36.9 C)] 98.4 F (36.9 C) (11/10 0438) Pulse Rate:  [68-88] 88 (11/10 0438) Resp:  [16-18] 18 (11/10 0438) BP: (114-127)/(52-56) 122/52 mmHg (11/10 0438) SpO2:  [91 %-92 %] 92 % (11/10 0438) Last BM Date: 04/01/15 General:   Alert,  Well-developed,    in NAD Heart:  Regular rate and rhythm; no murmurs Pulm;lungs Abdomen:  Soft, nontender and nondistended. Normal bowel sounds, without guarding, and without rebound.   Extremities:  Without edema. Neurologic:  Alert and  oriented x4;  grossly normal neurologically. Psych:  Alert and cooperative. Normal mood and affect.  Intake/Output from previous day: 11/09 0701 - 11/10 0700 In: 720 [P.O.:720] Out: 600 [Urine:600] Intake/Output this shift:    Lab Results:  Recent Labs  04/03/15 0422 04/04/15 0608 04/05/15 0736  WBC 17.1* 16.5* 10.8*  HGB 11.2* 9.4* 8.7*  HCT 34.4* 28.8* 26.5*  PLT 153 142* 131*   BMET  Recent Labs  04/03/15 0422 04/04/15 0608 04/05/15 0736  NA 136 136 132*  K 4.8 4.8 4.1  CL 104 103 97*  CO2 24 29 31   GLUCOSE 147* 110* 103*  BUN 24* 19 14  CREATININE 0.85 0.90 1.02  CALCIUM 8.7* 9.2 8.9    Dg Chest 2 View  04/04/2015  CLINICAL DATA:  Sudden decrease in O2 sats today. Mild SOB. EXAM: CHEST  2 VIEW COMPARISON:  01/01/2009 FINDINGS: Motion degraded AP. Lateral view degraded by patient arm position.  Lower cervical spine fixation. Moderate cardiomegaly. Mild right hemidiaphragm elevation. No definite pleural fluid. No pneumothorax. No congestive failure. Clear left lung. Right opacity obscuring the elevated right hemidiaphragm. IMPRESSION: New right hemidiaphragm elevation compared to 2009. Adjacent airspace disease could represent atelectasis or infection. Cardiomegaly without congestive failure. Both views are degraded, as detailed above. Electronically Signed   By: Abigail Miyamoto M.D.   On: 04/04/2015 11:48   Ct Angio Chest Pe W/cm &/or Wo Cm  04/04/2015  CLINICAL DATA:  Recent total knee replacement, now with chest discomfort and infiltrate seen on chest radiograph EXAM: CT ANGIOGRAPHY CHEST WITH CONTRAST TECHNIQUE: Multidetector CT imaging of the chest was performed using the standard protocol during bolus administration of intravenous contrast. Multiplanar CT image reconstructions and MIPs were obtained to evaluate the vascular anatomy. CONTRAST:  17mL OMNIPAQUE IOHEXOL 350 MG/ML SOLN COMPARISON:  Chest CT September 04, 2005; chest radiograph April 04, 2015. FINDINGS: There is no demonstrable pulmonary embolus. There is no thoracic aortic aneurysm or dissection. Visualized great vessels appear unremarkable. There is a small loculated right pleural effusion with patchy consolidation in the right base. There is atelectatic change in both lung bases, slightly more on the left than on the right. There is also patchy atelectasis in the inferior lingula. Thyroid appears unremarkable. There is no appreciable thoracic adenopathy. There are foci of coronary artery calcification. The pericardium is not thickened. There is a focal hiatal hernia. In the visualized upper abdomen, there is a cyst  arising from the posterior aspect of the upper pole the left kidney measuring 4.5 x 3.8 cm. Visualized upper abdominal structures otherwise appear unremarkable. There is degenerative change in the thoracic spine. There are no  blastic or lytic bone lesions. There is postoperative change in the lower cervical spine region. Review of the MIP images confirms the above findings. IMPRESSION: No demonstrable pulmonary embolus. Small loculated right pleural effusion with posterior right base consolidation. Patchy atelectasis in both lung bases as well as in the inferior lingula. No adenopathy. Foci of coronary artery calcification. Fairly small focal hiatal hernia. Electronically Signed   By: Lowella Grip III M.D.   On: 04/04/2015 15:59    ASSESSMENT/PLAN:   81 yo male s/p egd with severe esophagitis. Cont bid PPI and carafate AC and HS. Pt encouraged to minimize pain meds as they may exacerbate nausea. Encouraged to use miralax. Will add glycerin suppository. Encouraged mobilization. Pts family voiced concern over diminished po intake. Explained to them he is not eating much  Because he is nauseous.Will add boost or ensure to supplement po intake. Can restart lovenox. Have reviewed with Dr Coralee Rud ok to restart eliquis. There is a risk of bleeding--but pt had no active bleeding noted at time of EGD.     LOS: 3 days   Hvozdovic, Vita Barley PA-C 04/05/2015, Pager [phone] Mon-Fri 8a-5p [phone] after 5p, weekends, holidays     Attending physician's note   I have taken an interval history, reviewed the chart and examined the patient. I agree with the Advanced Practitioner's note, impression and recommendations.  Severe erosive esophagitis. Follow up biopsy results. Follow up office visit with Gastroenterologist Dr Thana Farr as outpatient . We will sign off, please call with any questions  K Denzil Magnuson, MD [phone] Mon-Fri 8a-5p [phone] after 5p, weekends, holidays

## 2015-04-06 ENCOUNTER — Inpatient Hospital Stay (HOSPITAL_COMMUNITY): Payer: Medicare Other

## 2015-04-06 DIAGNOSIS — M7989 Other specified soft tissue disorders: Secondary | ICD-10-CM

## 2015-04-06 DIAGNOSIS — M79604 Pain in right leg: Secondary | ICD-10-CM

## 2015-04-06 LAB — CBC WITH DIFFERENTIAL/PLATELET
BASOS PCT: 0 %
Basophils Absolute: 0 10*3/uL (ref 0.0–0.1)
EOS ABS: 0.3 10*3/uL (ref 0.0–0.7)
EOS PCT: 3 %
HEMATOCRIT: 27.1 % — AB (ref 39.0–52.0)
Hemoglobin: 8.9 g/dL — ABNORMAL LOW (ref 13.0–17.0)
Lymphocytes Relative: 13 %
Lymphs Abs: 1.4 10*3/uL (ref 0.7–4.0)
MCH: 30 pg (ref 26.0–34.0)
MCHC: 32.8 g/dL (ref 30.0–36.0)
MCV: 91.2 fL (ref 78.0–100.0)
MONO ABS: 0.8 10*3/uL (ref 0.1–1.0)
MONOS PCT: 7 %
Neutro Abs: 8.7 10*3/uL — ABNORMAL HIGH (ref 1.7–7.7)
Neutrophils Relative %: 77 %
PLATELETS: 152 10*3/uL (ref 150–400)
RBC: 2.97 MIL/uL — ABNORMAL LOW (ref 4.22–5.81)
RDW: 13.7 % (ref 11.5–15.5)
WBC: 11.3 10*3/uL — ABNORMAL HIGH (ref 4.0–10.5)

## 2015-04-06 LAB — BASIC METABOLIC PANEL
Anion gap: 10 (ref 5–15)
BUN: 15 mg/dL (ref 6–20)
CALCIUM: 9.1 mg/dL (ref 8.9–10.3)
CO2: 31 mmol/L (ref 22–32)
CREATININE: 0.95 mg/dL (ref 0.61–1.24)
Chloride: 95 mmol/L — ABNORMAL LOW (ref 101–111)
GFR calc non Af Amer: >60 mL/min (ref >60)
Glucose, Bld: 114 mg/dL — ABNORMAL HIGH (ref 65–99)
Potassium: 4.4 mmol/L (ref 3.5–5.1)
SODIUM: 136 mmol/L (ref 135–145)

## 2015-04-06 MED ORDER — SODIUM CHLORIDE 0.9 % IV SOLN
3.0000 g | Freq: Four times a day (QID) | INTRAVENOUS | Status: DC
  Administered 2015-04-06 (×2): 3 g via INTRAVENOUS
  Filled 2015-04-06 (×4): qty 3

## 2015-04-06 MED ORDER — SUCRALFATE 1 GM/10ML PO SUSP: 1.0000 g | Freq: Four times a day (QID) | ORAL | Status: DC

## 2015-04-06 MED ORDER — ACETAMINOPHEN 325 MG PO TABS: 650.0000 mg | ORAL_TABLET | Freq: Four times a day (QID) | ORAL | Status: DC | PRN

## 2015-04-06 MED ORDER — AMOXICILLIN-POT CLAVULANATE 875-125 MG PO TABS: 1.0000 | ORAL_TABLET | Freq: Two times a day (BID) | ORAL | Status: DC

## 2015-04-06 MED ORDER — HYDROCODONE-ACETAMINOPHEN 5-325 MG PO TABS: 1.0000 | ORAL_TABLET | Freq: Four times a day (QID) | ORAL | Status: DC | PRN

## 2015-04-06 MED ORDER — PANTOPRAZOLE SODIUM 40 MG PO TBEC: 40.0000 mg | DELAYED_RELEASE_TABLET | Freq: Two times a day (BID) | ORAL | Status: DC

## 2015-04-06 MED ORDER — SACCHAROMYCES BOULARDII 250 MG PO CAPS: ORAL_CAPSULE | ORAL | Status: DC

## 2015-04-06 NOTE — Progress Notes (Signed)
Occupational Therapy Treatment Patient Details Name: Eugene [name] MRN: [id number] DOB: [birth year] Today's Date: 04/06/2015    History of present illness 81 y.o. male s/p Rt TKA. PMH: sarcoidosis.   OT comments  Pt making good progress toward OT goals. Pt currently at an overall supervision level for ADLs and functional mobility. Educated and demonstrated walk in shower transfer technique; pt able to verbalize understanding and return demonstrate technique. Educated pt on need for supervision for safety during ADLs and functional mobility; pt verbalized understanding. Pt with no further questions or concerns for OT at this time. Pt ready to d/c home from an OT standpoint. Will continue to follow acutely.   Follow Up Recommendations  No OT follow up;Supervision - Intermittent    Equipment Recommendations  None recommended by OT    Recommendations for Other Services      Precautions / Restrictions Precautions Precautions: Knee;Fall Required Braces or Orthoses: Knee Immobilizer - Right Restrictions Weight Bearing Restrictions: Yes RLE Weight Bearing: Weight bearing as tolerated       Mobility Bed Mobility            General bed mobility comments: OOB in chair  Transfers Overall transfer level: Needs assistance Equipment used: Rolling walker (2 wheeled) Transfers: Sit to/from Stand Sit to Stand: Supervision         General transfer comment: Supervision for safety. Good hand placement and technique. Sit <> stand from chair x 1, BSC x 1    Balance Overall balance assessment: Needs assistance Sitting-balance support: No upper extremity supported Sitting balance-Leahy Scale: Good     Standing balance support: Bilateral upper extremity supported Standing balance-Leahy Scale: Fair Standing balance comment: RW for support                   ADL Overall ADL's : Needs assistance/impaired     Grooming: Supervision/safety;Standing                    Toilet Transfer: Supervision/safety;Ambulation;BSC;RW (BSC over toilet)   Toileting- Clothing Manipulation and Hygiene: Supervision/safety;Sit to/from stand   Tub/ Shower Transfer: Supervision/safety;Walk-in shower;Ambulation;Rolling walker Tub/Shower Transfer Details (indicate cue type and reason): Educated pt on walk in shower transfer technique; pt verbalized understanding and was able to return demonstrate technique Functional mobility during ADLs: Supervision/safety;Rolling walker General ADL Comments: Wife present for OT session. Educated pt on need for supervision for safety during ADLs and fucntional mobility upon return home; pt verbalized understanding.      Vision                     Perception     Praxis      Cognition   Behavior During Therapy: Agitated Overall Cognitive Status: Within Functional Limits for tasks assessed                       Extremity/Trunk Assessment               Exercises    Shoulder Instructions       General Comments      Pertinent Vitals/ Pain       Pain Assessment: Faces Pain Score: 2  Faces Pain Scale: Hurts a little bit Pain Location: R knee Pain Intervention(s): Limited activity within patient's tolerance;Monitored during session  Home Living  Prior Functioning/Environment              Frequency Min 2X/week     Progress Toward Goals  OT Goals(current goals can now be found in the care plan section)  Progress towards OT goals: Progressing toward goals  Acute Rehab OT Goals Patient Stated Goal: To go home today OT Goal Formulation: With patient  Plan Discharge plan remains appropriate    Co-evaluation                 End of Session Equipment Utilized During Treatment: Rolling walker   Activity Tolerance Patient tolerated treatment well   Patient Left in chair;with call bell/phone within reach;with nursing/sitter in  room   Nurse Communication          Time: [id number] OT Time Calculation (min): 13 min  Charges: OT General Charges $OT Visit: 1 Procedure OT Treatments $Self Care/Home Management : 8-22 mins  Binnie Kand M.S., OTR/L Pager: [phone]  04/06/2015, 12:44 PM

## 2015-04-06 NOTE — Progress Notes (Signed)
VASCULAR LAB PRELIMINARY  PRELIMINARY  PRELIMINARY  PRELIMINARY  Right lower extremity venous duplex completed.    Preliminary report:  Right:  No evidence of DVT, superficial thrombosis, or Baker's cyst.  Bhavesh Vazquez, RVS 04/06/2015, 5:09 PM

## 2015-04-06 NOTE — Progress Notes (Signed)
Physical Therapy Treatment Patient Details Name: Eugene [name] MRN: [id number] DOB: 05/26/1933 Today's Date: 04/06/2015    History of Present Illness 81 y.o. male s/p Rt TKA. PMH: sarcoidosis.    PT Comments    Progressing toward mobility goals as evident by ability to ambulate up/down steps with min guard for safety. Educated patient on need for supervision/assistance for stair ambulation due to decreased knee extension and overall R LE strength. Pt eager to d/c home today with home health for further strengthening and functional mobility training and has no concerns to address today with regards to PT.  Follow Up Recommendations  Home health PT     Equipment Recommendations  None recommended by PT    Recommendations for Other Services       Precautions / Restrictions Precautions Precautions: Knee;Fall Required Braces or Orthoses: Knee Immobilizer - Right Restrictions Weight Bearing Restrictions: Yes RLE Weight Bearing: Weight bearing as tolerated    Mobility  Bed Mobility Overal bed mobility: Modified Independent Bed Mobility: Supine to Sit;Sit to Supine           General bed mobility comments: up in chair upon arrival  Transfers Overall transfer level: Needs assistance Equipment used: Rolling walker (2 wheeled) Transfers: Sit to/from Stand Sit to Stand: Min guard         General transfer comment: no cues needed for hand placement; supervision for safety  Ambulation/Gait Ambulation/Gait assistance: Min guard Ambulation Distance (Feet):  (34ft, seated rest break, 157ft) Assistive device: Rolling walker (2 wheeled) Gait Pattern/deviations: Step-through pattern;Decreased step length - right;Decreased stride length;Decreased weight shift to right Gait velocity: decreased   General Gait Details: verbal cues for increased stride length and upright posture; slight increase in trunk flexion when standing vs previous tx   Stairs Stairs: Yes Stairs assistance:  Min guard Stair Management: Two rails;Forwards Number of Stairs: 2 General stair comments: verbal cues for sequencing and hand placement; needs increased R knee extension and UE support for safe descending  Wheelchair Mobility    Modified Rankin (Stroke Patients Only)       Balance Overall balance assessment: Needs assistance Sitting-balance support: No upper extremity supported Sitting balance-Leahy Scale: Good     Standing balance support: Bilateral upper extremity supported Standing balance-Leahy Scale: Fair Standing balance comment: RW for support                    Cognition Arousal/Alertness: Awake/alert Behavior During Therapy: WFL for tasks assessed/performed Overall Cognitive Status: Within Functional Limits for tasks assessed                      Exercises Total Joint Exercises Quad Sets: AROM;Strengthening;10 reps Short Arc Quad: AROM;15 reps;Seated;Strengthening    General Comments General comments (skin integrity, edema, etc.): pt appeared more fatigued this tx vs previous tx and displayed increased knee and trunk flex during functional mobility      Pertinent Vitals/Pain Pain Assessment: 0-10 Pain Score: 2  Faces Pain Scale: Hurts a little bit Pain Location: Rt knee Pain Intervention(s): Monitored during session    Home Living                      Prior Function            PT Goals (current goals can now be found in the care plan section) Acute Rehab PT Goals Patient Stated Goal: To go home today PT Goal Formulation: With patient Time For Goal Achievement: 04/16/15 Potential  to Achieve Goals: Good Progress towards PT goals: Progressing toward goals    Frequency  7X/week    PT Plan Current plan remains appropriate    Co-evaluation             End of Session Equipment Utilized During Treatment: Gait belt Activity Tolerance: Patient tolerated treatment well Patient left: in chair;with call bell/phone within  reach;with family/visitor present     Time: [id number] PT Time Calculation (min) (ACUTE ONLY): 25 min  Charges:  $Gait Training: 8-22 mins $Therapeutic Exercise: 8-22 mins                    G CodesDarliss Cheney, PTA [phone] 04/06/2015, 1:01 PM

## 2015-04-06 NOTE — Progress Notes (Addendum)
Name: Eugene [name] MRN: [id number] DOB: 08-22-1932    ADMISSION DATE:  04/02/2015 CONSULTATION DATE:  04/05/2015  REFERRING MD :  Noemi Chapel  CHIEF COMPLAINT:  Hypoxia  BRIEF PATIENT DESCRIPTION: 81 year old male with remote hx sarcoidosis who presented to [facility] 11/7 for total R knee arthoplasty. Hospital course complicated by Upper GI bleed and hypoxia post operatively. CT chest 11/9 with loculated R effusion and consolidation. PCCM asked to see for further eval  SIGNIFICANT EVENTS  11/7 admit for R total Knee 11/9 Upper EGD > severe esophagitis, 4-5cm hiatal hernia   STUDIES:  CTA Chest (11/9) - small loculated R pleural effusion with posterior R base consolidation. Patchy atelectasis in both lung bases as well as in the inferior lingula. CXR PA/LAT (11/11) - very small amount of clearing of the right lower lobe opacity. No new opacity or effusion appreciated.  SUBJECTIVE: Patient ambulated in the hallway with physical therapy today. Denies any significant dyspnea. Patient was having coughing this morning productive of a clear phlegm. No nausea or vomiting.  REVIEW OF SYSTEMS:  No subjective fever, chills, or sweats. No chest pain or pressure.  VITAL SIGNS: Temp:  [98.1 F (36.7 C)-98.2 F (36.8 C)] 98.1 F (36.7 C) (11/11 0545) Pulse Rate:  [76-77] 76 (11/11 0545) Resp:  [16-18] 16 (11/11 0545) BP: (116-129)/(53-63) 129/53 mmHg (11/11 0545) SpO2:  [90 %-96 %] 96 % (11/11 0545)  PHYSICAL EXAMINATION: General: Awake. Alert. No acute distress. Sitting in chair watching TV with wife at bedside.  Integument: Warm & dry. No rash on exposed skin.  HEENT: Moist mucus membranes. No scleral injection or icterus.  Cardiovascular: Regular rate. No appreciable JVD.  Pulmonary: Diminished breath sounds bilateral lung bases persists. Symmetric chest wall expansion. No accessory muscle use on room air. Abdomen: Soft. Normal bowel sounds. Nondistended.    Recent Labs Lab  04/04/15 0608 04/05/15 0736 04/06/15 0813  NA 136 132* 136  K 4.8 4.1 4.4  CL 103 97* 95*  CO2 29 31 31   BUN 19 14 15   CREATININE 0.90 1.02 0.95  GLUCOSE 110* 103* 114*    Recent Labs Lab 04/04/15 0608 04/05/15 0736 04/06/15 0813  HGB 9.4* 8.7* 8.9*  HCT 28.8* 26.5* 27.1*  WBC 16.5* 10.8* 11.3*  PLT 142* 131* 152   Dg Chest 2 View  04/06/2015  CLINICAL DATA:  Persistent cough EXAM: CHEST  2 VIEW COMPARISON:  Chest radiograph and chest CT April 04, 2015 FINDINGS: There has been partial clearing of infiltrate from the right base. There remains patchy atelectasis in the right base. Lungs elsewhere clear. Heart size and pulmonary vascularity are normal. No adenopathy. There is degenerative change in the thoracic spine. There is postoperative change in the lower cervical spine region. IMPRESSION: Partial clearing of infiltrate right base. Patchy atelectasis remains in this area. No new opacity. No change in cardiac silhouette. Electronically Signed   By: Lowella Grip III M.D.   On: 04/06/2015 08:08   Ct Angio Chest Pe W/cm &/or Wo Cm  04/04/2015  CLINICAL DATA:  Recent total knee replacement, now with chest discomfort and infiltrate seen on chest radiograph EXAM: CT ANGIOGRAPHY CHEST WITH CONTRAST TECHNIQUE: Multidetector CT imaging of the chest was performed using the standard protocol during bolus administration of intravenous contrast. Multiplanar CT image reconstructions and MIPs were obtained to evaluate the vascular anatomy. CONTRAST:  127mL OMNIPAQUE IOHEXOL 350 MG/ML SOLN COMPARISON:  Chest CT September 04, 2005; chest radiograph April 04, 2015. FINDINGS: There is no  demonstrable pulmonary embolus. There is no thoracic aortic aneurysm or dissection. Visualized great vessels appear unremarkable. There is a small loculated right pleural effusion with patchy consolidation in the right base. There is atelectatic change in both lung bases, slightly more on the left than on the right.  There is also patchy atelectasis in the inferior lingula. Thyroid appears unremarkable. There is no appreciable thoracic adenopathy. There are foci of coronary artery calcification. The pericardium is not thickened. There is a focal hiatal hernia. In the visualized upper abdomen, there is a cyst arising from the posterior aspect of the upper pole the left kidney measuring 4.5 x 3.8 cm. Visualized upper abdominal structures otherwise appear unremarkable. There is degenerative change in the thoracic spine. There are no blastic or lytic bone lesions. There is postoperative change in the lower cervical spine region. Review of the MIP images confirms the above findings. IMPRESSION: No demonstrable pulmonary embolus. Small loculated right pleural effusion with posterior right base consolidation. Patchy atelectasis in both lung bases as well as in the inferior lingula. No adenopathy. Foci of coronary artery calcification. Fairly small focal hiatal hernia. Electronically Signed   By: Lowella Grip III M.D.   On: 04/04/2015 15:59    ASSESSMENT / PLAN:  81 year old male with remote diagnosis of sarcoidosis. Hypoxia seems to be improving as patient is currently weaned off of oxygen therapy. Patient will need follow-up imaging in 4-6 weeks to ensure resolution of opacity. Patient continuing to perform incentive spirometry and I encouraged him to do so at home if he is discharged. I will not have the weekend physician rounded on the patient. Please contact the on-call physician if the patient remains hospitalized in there are any further questions over the weekend otherwise we'll reassess on Monday if necessary.  1. Right lower lobe opacity: Consistent with aspiration pneumonia. Recommend empiric treatment with Augmentin 875 mg by mouth twice a day 7 days. Plan for follow-up CT scan of the chest without contrast in 4-6 weeks to ensure resolution. 2. Acute hypoxic respiratory failure: Likely secondary to atelectasis.  Recommend continued ambulation & pulmonary toilet with incentive spirometer. 3. Sarcoidosis: No evidence of acute flare at this time. 4. Follow-up: 12/12 @ 2pm with Dr. Ashok Cordia at our [place] office.  Sonia Baller Ashok Cordia, M.D. [facility] Pager:  [phone] After 3pm or if no response, call [phone]  04/06/2015 11:34 AM

## 2015-04-06 NOTE — Progress Notes (Signed)
Physical Therapy Treatment Patient Details Name: Eugene [name] MRN: [id number] DOB: 1933-01-02 Today's Date: 04/06/2015    History of Present Illness 81 y.o. male s/p Rt TKA. PMH: sarcoidosis.    PT Comments    Progressing toward mobility goals as evident by decreased need for assistance with functional transfers, bed mobility, and gait and ambulation distance of 147ft supervision with min guard for safety when turning and back ward steps. Verbalized and demonstrated understanding of HEP. Continue to work on mobility and strengthening goals with anticipation of return home with home health to improve right knee ROM.  Follow Up Recommendations  Home health PT     Equipment Recommendations  None recommended by PT    Recommendations for Other Services       Precautions / Restrictions Precautions Precautions: Knee;Fall Required Braces or Orthoses: Knee Immobilizer - Right Restrictions Weight Bearing Restrictions: Yes RLE Weight Bearing: Weight bearing as tolerated    Mobility  Bed Mobility Overal bed mobility: Modified Independent Bed Mobility: Supine to Sit;Sit to Supine           General bed mobility comments: use of bed rail  Transfers Overall transfer level: Modified independent Equipment used: Rolling walker (2 wheeled) Transfers: Sit to/from [facility] Sit to Stand: Supervision;Min guard         General transfer comment: carry over of sequencing and hand placement and demonstrated good safety awareness  Ambulation/Gait Ambulation/Gait assistance: Min guard Ambulation Distance (Feet):  (79ft, seated rest break, 174ft) Assistive device: Rolling walker (2 wheeled) Gait Pattern/deviations: Step-through pattern;Decreased stride length;Decreased stance time - right;Decreased weight shift to right Gait velocity: decreased   General Gait Details: improved trunk extension but guarded with R knee extension (verbal cues for increased bilateral step  length)   Stairs Stairs:  (pt refused stair training and reported no stairs at home)          Wheelchair Mobility    Modified Rankin (Stroke Patients Only)       Balance Overall balance assessment: Modified Independent;Needs assistance Sitting-balance support: No upper extremity supported Sitting balance-Leahy Scale: Good     Standing balance support: Bilateral upper extremity supported (some unilateral static/dynamic standing activity with no LOB) Standing balance-Leahy Scale: Poor Standing balance comment: RW for UE support                    Cognition                            Exercises Total Joint Exercises Quad Sets: AROM;Strengthening;10 reps Short Arc Quad: AROM;15 reps;Seated;Strengthening    General Comments        Pertinent Vitals/Pain Pain Assessment: 0-10 Pain Score: 2  Pain Location: Rt knee    Home Living                      Prior Function            PT Goals (current goals can now be found in the care plan section) Acute Rehab PT Goals Patient Stated Goal: Be able to go home today PT Goal Formulation: With patient Time For Goal Achievement: 04/16/15 Potential to Achieve Goals: Good Progress towards PT goals: Progressing toward goals    Frequency  7X/week    PT Plan Current plan remains appropriate    Co-evaluation             End of Session Equipment Utilized During Treatment: Gait  belt Activity Tolerance: Patient tolerated treatment well Patient left: in chair;with call bell/phone within reach;with family/visitor present     Time: [id number] PT Time Calculation (min) (ACUTE ONLY): 35 min  Charges:  $Gait Training: 8-22 mins $Therapeutic Exercise: 8-22 mins                    G CodesDarliss Cheney, PTA [phone] 04/06/2015, 11:07 AM

## 2015-04-07 DIAGNOSIS — Z96651 Presence of right artificial knee joint: Secondary | ICD-10-CM | POA: Diagnosis not present

## 2015-04-07 DIAGNOSIS — Z471 Aftercare following joint replacement surgery: Secondary | ICD-10-CM | POA: Diagnosis not present

## 2015-04-07 DIAGNOSIS — D860 Sarcoidosis of lung: Secondary | ICD-10-CM | POA: Diagnosis not present

## 2015-04-07 DIAGNOSIS — M1990 Unspecified osteoarthritis, unspecified site: Secondary | ICD-10-CM | POA: Diagnosis not present

## 2015-04-07 DIAGNOSIS — Z87891 Personal history of nicotine dependence: Secondary | ICD-10-CM | POA: Diagnosis not present

## 2015-04-07 DIAGNOSIS — I4510 Unspecified right bundle-branch block: Secondary | ICD-10-CM | POA: Diagnosis not present

## 2015-04-09 DIAGNOSIS — M1711 Unilateral primary osteoarthritis, right knee: Secondary | ICD-10-CM | POA: Diagnosis not present

## 2015-04-09 DIAGNOSIS — Z96651 Presence of right artificial knee joint: Secondary | ICD-10-CM | POA: Diagnosis not present

## 2015-04-09 DIAGNOSIS — I4510 Unspecified right bundle-branch block: Secondary | ICD-10-CM | POA: Diagnosis not present

## 2015-04-09 DIAGNOSIS — Z471 Aftercare following joint replacement surgery: Secondary | ICD-10-CM | POA: Diagnosis not present

## 2015-04-09 DIAGNOSIS — M1990 Unspecified osteoarthritis, unspecified site: Secondary | ICD-10-CM | POA: Diagnosis not present

## 2015-04-09 DIAGNOSIS — Z87891 Personal history of nicotine dependence: Secondary | ICD-10-CM | POA: Diagnosis not present

## 2015-04-09 DIAGNOSIS — D860 Sarcoidosis of lung: Secondary | ICD-10-CM | POA: Diagnosis not present

## 2015-04-10 DIAGNOSIS — M1990 Unspecified osteoarthritis, unspecified site: Secondary | ICD-10-CM | POA: Diagnosis not present

## 2015-04-10 DIAGNOSIS — I4510 Unspecified right bundle-branch block: Secondary | ICD-10-CM | POA: Diagnosis not present

## 2015-04-10 DIAGNOSIS — Z87891 Personal history of nicotine dependence: Secondary | ICD-10-CM | POA: Diagnosis not present

## 2015-04-10 DIAGNOSIS — Z96651 Presence of right artificial knee joint: Secondary | ICD-10-CM | POA: Diagnosis not present

## 2015-04-10 DIAGNOSIS — D860 Sarcoidosis of lung: Secondary | ICD-10-CM | POA: Diagnosis not present

## 2015-04-10 DIAGNOSIS — Z471 Aftercare following joint replacement surgery: Secondary | ICD-10-CM | POA: Diagnosis not present

## 2015-04-11 DIAGNOSIS — D860 Sarcoidosis of lung: Secondary | ICD-10-CM | POA: Diagnosis not present

## 2015-04-11 DIAGNOSIS — Z87891 Personal history of nicotine dependence: Secondary | ICD-10-CM | POA: Diagnosis not present

## 2015-04-11 DIAGNOSIS — I4510 Unspecified right bundle-branch block: Secondary | ICD-10-CM | POA: Diagnosis not present

## 2015-04-11 DIAGNOSIS — Z96651 Presence of right artificial knee joint: Secondary | ICD-10-CM | POA: Diagnosis not present

## 2015-04-11 DIAGNOSIS — M1990 Unspecified osteoarthritis, unspecified site: Secondary | ICD-10-CM | POA: Diagnosis not present

## 2015-04-11 DIAGNOSIS — Z471 Aftercare following joint replacement surgery: Secondary | ICD-10-CM | POA: Diagnosis not present

## 2015-04-12 DIAGNOSIS — I4510 Unspecified right bundle-branch block: Secondary | ICD-10-CM | POA: Diagnosis not present

## 2015-04-12 DIAGNOSIS — Z471 Aftercare following joint replacement surgery: Secondary | ICD-10-CM | POA: Diagnosis not present

## 2015-04-12 DIAGNOSIS — D860 Sarcoidosis of lung: Secondary | ICD-10-CM | POA: Diagnosis not present

## 2015-04-12 DIAGNOSIS — Z87891 Personal history of nicotine dependence: Secondary | ICD-10-CM | POA: Diagnosis not present

## 2015-04-12 DIAGNOSIS — Z96651 Presence of right artificial knee joint: Secondary | ICD-10-CM | POA: Diagnosis not present

## 2015-04-12 DIAGNOSIS — M1990 Unspecified osteoarthritis, unspecified site: Secondary | ICD-10-CM | POA: Diagnosis not present

## 2015-04-13 DIAGNOSIS — Z96651 Presence of right artificial knee joint: Secondary | ICD-10-CM | POA: Diagnosis not present

## 2015-04-13 DIAGNOSIS — M1990 Unspecified osteoarthritis, unspecified site: Secondary | ICD-10-CM | POA: Diagnosis not present

## 2015-04-13 DIAGNOSIS — Z471 Aftercare following joint replacement surgery: Secondary | ICD-10-CM | POA: Diagnosis not present

## 2015-04-13 DIAGNOSIS — D860 Sarcoidosis of lung: Secondary | ICD-10-CM | POA: Diagnosis not present

## 2015-04-13 DIAGNOSIS — Z87891 Personal history of nicotine dependence: Secondary | ICD-10-CM | POA: Diagnosis not present

## 2015-04-13 DIAGNOSIS — I4510 Unspecified right bundle-branch block: Secondary | ICD-10-CM | POA: Diagnosis not present

## 2015-04-14 NOTE — Discharge Summary (Signed)
Patient ID: Eugene [name] MRN: [id number] DOB/AGE: 10/25/1932 81 y.o.  Admit date: 04/02/2015 Discharge date: 04/06/2015  Admission Diagnoses:  Principal Problem:   Primary localized osteoarthritis of right knee Active Problems:   Aortic valve stenosis, mild   Hyperlipidemia   DJD (degenerative joint disease) of knee   Low O2 saturation   Upper GI bleed   Acute esophagitis   Opacity of lung on imaging study   Acute respiratory failure with hypoxia (HCC)   Sarcoidosis (HCC)   Discharge Diagnoses:  Same  Past Medical History  Diagnosis Date  . Hyperlipidemia   . Sarcoidosis ([place])     diagnosed 8-9 yrs ago  . BPH (benign prostatic hyperplasia)   . Incomplete right bundle branch block   . History of kidney stones   . Hernia of abdominal cavity   . Bladder stones   . Primary localized osteoarthritis of right knee 04/02/2015  . Low O2 saturation 04/04/2015    Wife says O2 sats are in the low 90s base line.  Now in the 3s.  Will do incentive spirometry  . Upper GI bleed 04/04/2015    Patient had coffee ground emesis that was Guiac positive on Monday.  EGD scheduled for 04/04/2015. Started on IV Protonix    Surgeries: Procedure(s): ESOPHAGOGASTRODUODENOSCOPY (EGD) on 04/02/2015 - 04/04/2015   Consultants:  GI and Pulmonary  Discharged Condition: Improved  Hospital Course: Eugene [name] is an 81 y.o. male who was admitted 04/02/2015 for operative treatment ofPrimary localized osteoarthritis of right knee. Patient has severe unremitting pain that affects sleep, daily activities, and work/hobbies. After pre-op clearance the patient was taken to the operating room on 04/02/2015 - 04/04/2015 and underwent  Procedure(s): ESOPHAGOGASTRODUODENOSCOPY (EGD).    Patient was given perioperative antibiotics:  Anti-infectives    Start     Dose/Rate Route Frequency Ordered Stop   04/06/15 1000  Ampicillin-Sulbactam (UNASYN) 3 g in sodium chloride 0.9 % 100 mL IVPB  Status:  Discontinued     3  g 100 mL/hr over 60 Minutes Intravenous Every 6 hours 04/06/15 0953 04/06/15 2042   04/06/15 0000  amoxicillin-clavulanate (AUGMENTIN) 875-125 MG tablet     1 tablet Oral 2 times daily 04/06/15 1549     04/05/15 1845  amoxicillin-clavulanate (AUGMENTIN) 875-125 MG per tablet 1 tablet  Status:  Discontinued     1 tablet Oral 2 times daily with meals 04/05/15 1842 04/06/15 0953   04/02/15 1330  ceFAZolin (ANCEF) IVPB 2 g/50 mL premix     2 g 100 mL/hr over 30 Minutes Intravenous Every 6 hours 04/02/15 1104 04/02/15 2257   04/02/15 0600  ceFAZolin (ANCEF) IVPB 2 g/50 mL premix     2 g 100 mL/hr over 30 Minutes Intravenous On call to O.R. 04/01/15 1252 04/02/15 0725       Patient was given sequential compression devices, early ambulation, and chemoprophylaxis to prevent DVT.  The day of surgery post operatively patient had guiac positive emesis.  GI was consulted and did an endoscopy on 04/04/2015 which found erosive severe esophagitis but no active bleed on Tuesday as we tried to wean this patient from 02 he had difficulty maintaining his saturation.  We tied incentive spirometry and aggressive pulmonary toilet.  Chest xray showed right lower lobe infiltrate.  Chest CT ruled out PE and masses.   Patient was treat on Unasyn for aspiration pneumonia and discharged to home with home health PT and RN.  His knee progressed well with PT in the hospital.  Patient benefited maximally from hospital stay and there were no complications.    Recent vital signs: No data found.    Recent laboratory studies: No results for input(s): WBC, HGB, HCT, PLT, NA, K, CL, CO2, BUN, CREATININE, GLUCOSE, INR, CALCIUM in the last 72 hours.  Invalid input(s): PT, 2   Discharge Medications:     Medication List    STOP taking these medications        aspirin 81 MG tablet      TAKE these medications        acetaminophen 325 MG tablet  Commonly known as:  TYLENOL  Take 2 tablets (650 mg total) by mouth every 6  (six) hours as needed for mild pain (or Fever >/= 101).     amoxicillin-clavulanate 875-125 MG tablet  Commonly known as:  AUGMENTIN  Take 1 tablet by mouth 2 (two) times daily.     finasteride 5 MG tablet  Commonly known as:  PROSCAR  Take 5 mg by mouth every evening.     HYDROcodone-acetaminophen 5-325 MG tablet  Commonly known as:  NORCO  Take 1-2 tablets by mouth every 6 (six) hours as needed for moderate pain.     pantoprazole 40 MG tablet  Commonly known as:  PROTONIX  Take 1 tablet (40 mg total) by mouth 2 (two) times daily.     potassium citrate 10 MEQ (1080 MG) SR tablet  Commonly known as:  UROCIT-K  Take 1 tablet by mouth daily.     saccharomyces boulardii 250 MG capsule  Commonly known as:  FLORASTOR  1 tablet twice a day while on antibiotics to prevent diarrhea     sucralfate 1 GM/10ML suspension  Commonly known as:  CARAFATE  Take 10 mLs (1 g total) by mouth every 6 (six) hours.     VYTORIN 10-40 MG tablet  Generic drug:  ezetimibe-simvastatin  TAKE (1) TABLET DAILY AT BEDTIME.        Diagnostic Studies: Dg Chest 2 View  04/06/2015  CLINICAL DATA:  Persistent cough EXAM: CHEST  2 VIEW COMPARISON:  Chest radiograph and chest CT April 04, 2015 FINDINGS: There has been partial clearing of infiltrate from the right base. There remains patchy atelectasis in the right base. Lungs elsewhere clear. Heart size and pulmonary vascularity are normal. No adenopathy. There is degenerative change in the thoracic spine. There is postoperative change in the lower cervical spine region. IMPRESSION: Partial clearing of infiltrate right base. Patchy atelectasis remains in this area. No new opacity. No change in cardiac silhouette. Electronically Signed   By: Lowella Grip III M.D.   On: 04/06/2015 08:08   Dg Chest 2 View  04/04/2015  CLINICAL DATA:  Sudden decrease in O2 sats today. Mild SOB. EXAM: CHEST  2 VIEW COMPARISON:  01/01/2009 FINDINGS: Motion degraded AP. Lateral  view degraded by patient arm position. Lower cervical spine fixation. Moderate cardiomegaly. Mild right hemidiaphragm elevation. No definite pleural fluid. No pneumothorax. No congestive failure. Clear left lung. Right opacity obscuring the elevated right hemidiaphragm. IMPRESSION: New right hemidiaphragm elevation compared to 2009. Adjacent airspace disease could represent atelectasis or infection. Cardiomegaly without congestive failure. Both views are degraded, as detailed above. Electronically Signed   By: Abigail Miyamoto M.D.   On: 04/04/2015 11:48   Ct Angio Chest Pe W/cm &/or Wo Cm  04/04/2015  CLINICAL DATA:  Recent total knee replacement, now with chest discomfort and infiltrate seen on chest radiograph EXAM: CT ANGIOGRAPHY CHEST WITH CONTRAST TECHNIQUE: Multidetector  CT imaging of the chest was performed using the standard protocol during bolus administration of intravenous contrast. Multiplanar CT image reconstructions and MIPs were obtained to evaluate the vascular anatomy. CONTRAST:  110mL OMNIPAQUE IOHEXOL 350 MG/ML SOLN COMPARISON:  Chest CT September 04, 2005; chest radiograph April 04, 2015. FINDINGS: There is no demonstrable pulmonary embolus. There is no thoracic aortic aneurysm or dissection. Visualized great vessels appear unremarkable. There is a small loculated right pleural effusion with patchy consolidation in the right base. There is atelectatic change in both lung bases, slightly more on the left than on the right. There is also patchy atelectasis in the inferior lingula. Thyroid appears unremarkable. There is no appreciable thoracic adenopathy. There are foci of coronary artery calcification. The pericardium is not thickened. There is a focal hiatal hernia. In the visualized upper abdomen, there is a cyst arising from the posterior aspect of the upper pole the left kidney measuring 4.5 x 3.8 cm. Visualized upper abdominal structures otherwise appear unremarkable. There is degenerative  change in the thoracic spine. There are no blastic or lytic bone lesions. There is postoperative change in the lower cervical spine region. Review of the MIP images confirms the above findings. IMPRESSION: No demonstrable pulmonary embolus. Small loculated right pleural effusion with posterior right base consolidation. Patchy atelectasis in both lung bases as well as in the inferior lingula. No adenopathy. Foci of coronary artery calcification. Fairly small focal hiatal hernia. Electronically Signed   By: Lowella Grip III M.D.   On: 04/04/2015 15:59    Disposition: 01-Home or Self Care      Discharge Instructions    CPM    Complete by:  As directed   Continuous passive motion machine (CPM):      Use the CPM from 0 to 90 for 6 hours per day.       You may break it up into 2 or 3 sessions per day.      Use CPM for 2 weeks or until you are told to stop.     Call MD / Call 911    Complete by:  As directed   If you experience chest pain or shortness of breath, CALL 911 and be transported to the hospital emergency room.  If you develope a fever above 101 F, pus (white drainage) or increased drainage or redness at the wound, or calf pain, call your surgeon's office.     Change dressing    Complete by:  As directed   Change the gauze dressing daily with sterile 4 x 4 inch gauze and apply TED hose.  DO NOT REMOVE BANDAGE OVER SURGICAL INCISION.  [place] WHOLE LEG INCLUDING OVER THE WATERPROOF BANDAGE WITH SOAP AND WATER EVERY DAY.     Constipation Prevention    Complete by:  As directed   Drink plenty of fluids.  Prune juice may be helpful.  You may use a stool softener, such as Colace (over the counter) 100 mg twice a day.  Use MiraLax (over the counter) for constipation as needed.     Diet - low sodium heart healthy    Complete by:  As directed      Discharge instructions    Complete by:  As directed   INSTRUCTIONS AFTER JOINT REPLACEMENT   Sequential Compression hose have been ordered for you  to use at all times except if he is walking.  It is medically necessary to wear these to prevent blood clots.  You are not a candidate  for prescription  Remove items at home which could result in a fall. This includes throw rugs or furniture in walking pathways ICE to the affected joint every three hours while awake for 30 minutes at a time, for at least the first 3-5 days, and then as needed for pain and swelling.  Continue to use ice for pain and swelling. You may notice swelling that will progress down to the foot and ankle.  This is normal after surgery.  Elevate your leg when you are not up walking on it.   Continue to use the breathing machine you got in the hospital (incentive spirometer) which will help keep your temperature down.  It is common for your temperature to cycle up and down following surgery, especially at night when you are not up moving around and exerting yourself.  The breathing machine keeps your lungs expanded and your temperature down.   DIET:  As you were doing prior to hospitalization, we recommend a well-balanced diet.  DRESSING / WOUND CARE / SHOWERING  You may shower 3 days after surgery, but keep the wounds dry during showering.  You may use an occlusive plastic wrap (Press'n Seal for example), NO SOAKING/SUBMERGING IN THE BATHTUB.  If the bandage gets wet, change with a clean dry gauze.  If the incision gets wet, pat the wound dry with a clean towel.  ACTIVITY  Increase activity slowly as tolerated, but follow the weight bearing instructions below.   No driving for 6 weeks or until further direction given by your physician.  You cannot drive while taking narcotics.  No lifting or carrying greater than 10 lbs. until further directed by your surgeon. Avoid periods of inactivity such as sitting longer than an hour when not asleep. This helps prevent blood clots.  You may return to work once you are authorized by your doctor.     WEIGHT BEARING   Weight bearing  as tolerated with assist device (walker, cane, etc) as directed, use it as long as suggested by your surgeon or therapist, typically at least 4 weeks.   EXERCISES  Results after joint replacement surgery are often greatly improved when you follow the exercise, range of motion and muscle strengthening exercises prescribed by your doctor. Safety measures are also important to protect the joint from further injury. Any time any of these exercises cause you to have increased pain or swelling, decrease what you are doing until you are comfortable again and then slowly increase them. If you have problems or questions, call your caregiver or physical therapist for advice.   Rehabilitation is important following a joint replacement. After just a few days of immobilization, the muscles of the leg can become weakened and shrink (atrophy).  These exercises are designed to build up the tone and strength of the thigh and leg muscles and to improve motion. Often times heat used for twenty to thirty minutes before working out will loosen up your tissues and help with improving the range of motion but do not use heat for the first two weeks following surgery (sometimes heat can increase post-operative swelling).   These exercises can be done on a training (exercise) mat, on the floor, on a table or on a bed. Use whatever works the best and is most comfortable for you.    Use music or television while you are exercising so that the exercises are a pleasant break in your day. This will make your life better with the exercises acting as a break  in your routine that you can look forward to.   Perform all exercises about fifteen times, three times per day or as directed.  You should exercise both the operative leg and the other leg as well.   Exercises include:   Quad Sets - Tighten up the muscle on the front of the thigh (Quad) and hold for 5-10 seconds.   Straight Leg Raises - With your knee straight (if you were given a  brace, keep it on), lift the leg to 60 degrees, hold for 3 seconds, and slowly lower the leg.  Perform this exercise against resistance later as your leg gets stronger.  Leg Slides: Lying on your back, slowly slide your foot toward your buttocks, bending your knee up off the floor (only go as far as is comfortable). Then slowly slide your foot back down until your leg is flat on the floor again.  Angel Wings: Lying on your back spread your legs to the side as far apart as you can without causing discomfort.  Hamstring Strength:  Lying on your back, push your heel against the floor with your leg straight by tightening up the muscles of your buttocks.  Repeat, but this time bend your knee to a comfortable angle, and push your heel against the floor.  You may put a pillow under the heel to make it more comfortable if necessary.   A rehabilitation program following joint replacement surgery can speed recovery and prevent re-injury in the future due to weakened muscles. Contact your doctor or a physical therapist for more information on knee rehabilitation.    CONSTIPATION  Constipation is defined medically as fewer than three stools per week and severe constipation as less than one stool per week.  Even if you have a regular bowel pattern at home, your normal regimen is likely to be disrupted due to multiple reasons following surgery.  Combination of anesthesia, postoperative narcotics, change in appetite and fluid intake all can affect your bowels.   YOU MUST use at least one of the following options; they are listed in order of increasing strength to get the job done.  They are all available over the counter, and you may need to use some, POSSIBLY even all of these options:    Drink plenty of fluids (prune juice may be helpful) and high fiber foods Colace 100 mg by mouth twice a day  Senokot for constipation as directed and as needed Dulcolax (bisacodyl), take with full glass of water  Miralax  (polyethylene glycol) once or twice a day as needed.  If you have tried all these things and are unable to have a bowel movement in the first 3-4 days after surgery call either your surgeon or your primary doctor.    If you experience loose stools or diarrhea, hold the medications until you stool forms back up.  If your symptoms do not get better within 1 week or if they get worse, check with your doctor.  If you experience "the worst abdominal pain ever" or develop nausea or vomiting, please contact the office immediately for further recommendations for treatment.   ITCHING:  If you experience itching with your medications, try taking only a single pain pill, or even half a pain pill at a time.  You can also use Benadryl over the counter for itching or also to help with sleep.   TED HOSE STOCKINGS:  Use stockings on both legs until for at least 2 weeks or as directed by physician office.  They may be removed at night for sleeping.  MEDICATIONS:  See your medication summary on the "After Visit Summary" that nursing will review with you.  You may have some home medications which will be placed on hold until you complete the course of blood thinner medication.  It is important for you to complete the blood thinner medication as prescribed.  PRECAUTIONS:  If you experience chest pain or shortness of breath - call 911 immediately for transfer to the hospital emergency department.   If you develop a fever greater that 101 F, purulent drainage from wound, increased redness or drainage from wound, foul odor from the wound/dressing, or calf pain - CONTACT YOUR SURGEON.                                                   FOLLOW-UP APPOINTMENTS:  If you do not already have a post-op appointment, please call the office for an appointment to be seen by your surgeon.  Guidelines for how soon to be seen are listed in your "After Visit Summary", but are typically between 1-4 weeks after surgery.  OTHER  INSTRUCTIONS:   Knee Replacement:  Do not place pillow under knee, focus on keeping the knee straight while resting. CPM instructions: 0-90 degrees, 2 hours in the morning, 2 hours in the afternoon, and 2 hours in the evening. Place foam block, curve side up under heel at all times except when in CPM or when walking.  DO NOT modify, tear, cut, or change the foam block in any way.  MAKE SURE YOU:  Understand these instructions.  Get help right away if you are not doing well or get worse.    Thank you for letting us be a part of your medical care team.  It is a privilege we respect greatly.  We hope these instructions will help you stay on track for a fast and full recovery!     Do not put a pillow under the knee. Place it under the heel.    Complete by:  As directed   Place gray foam block, curve side up under heel at all times except when in CPM or when walking.  DO NOT modify, tear, cut, or change in any way the gray foam block.     Increase activity slowly as tolerated    Complete by:  As directed      TED hose    Complete by:  As directed   Use stockings (TED hose) for 2 weeks on both leg(s).  You may remove them at night for sleeping.           Follow-up Information    Follow up with [facility].   Why:  Someone from [facility] will contact you to arrange start date for therapy. If you have questions please call them.Minette Brine information:   [address] [place] [address] [phone]       Follow up with Lorn Junes, MD On 04/16/2015.   Specialty:  Orthopedic Surgery   Why:  APPT TIME 2:15 PM   Contact information:   [place] [address] [phone]       Follow up with MEDOFF,JEFFREY R, MD.   Specialty:  Gastroenterology   Why:  Call Dr Hal Hope schedule follow up  appt.   Contact information:   [place] [address] [phone]       Follow up with Tera Partridge, MD On 05/07/2015.   Specialty:  Pulmonary Disease   Why:  appt time 2 pm   Contact information:   [address] [place] [address] [phone]        Signed: Linda Hedges 04/14/2015, 12:41 PM

## 2015-04-16 DIAGNOSIS — M25561 Pain in right knee: Secondary | ICD-10-CM | POA: Diagnosis not present

## 2015-04-16 DIAGNOSIS — M25661 Stiffness of right knee, not elsewhere classified: Secondary | ICD-10-CM | POA: Diagnosis not present

## 2015-04-16 DIAGNOSIS — R262 Difficulty in walking, not elsewhere classified: Secondary | ICD-10-CM | POA: Diagnosis not present

## 2015-04-16 DIAGNOSIS — Z96651 Presence of right artificial knee joint: Secondary | ICD-10-CM | POA: Diagnosis not present

## 2015-04-16 DIAGNOSIS — M25461 Effusion, right knee: Secondary | ICD-10-CM | POA: Diagnosis not present

## 2015-04-17 DIAGNOSIS — M25461 Effusion, right knee: Secondary | ICD-10-CM | POA: Diagnosis not present

## 2015-04-17 DIAGNOSIS — M25661 Stiffness of right knee, not elsewhere classified: Secondary | ICD-10-CM | POA: Diagnosis not present

## 2015-04-17 DIAGNOSIS — R262 Difficulty in walking, not elsewhere classified: Secondary | ICD-10-CM | POA: Diagnosis not present

## 2015-04-17 DIAGNOSIS — M25561 Pain in right knee: Secondary | ICD-10-CM | POA: Diagnosis not present

## 2015-04-18 DIAGNOSIS — M25461 Effusion, right knee: Secondary | ICD-10-CM | POA: Diagnosis not present

## 2015-04-18 DIAGNOSIS — M25561 Pain in right knee: Secondary | ICD-10-CM | POA: Diagnosis not present

## 2015-04-18 DIAGNOSIS — R262 Difficulty in walking, not elsewhere classified: Secondary | ICD-10-CM | POA: Diagnosis not present

## 2015-04-18 DIAGNOSIS — M25661 Stiffness of right knee, not elsewhere classified: Secondary | ICD-10-CM | POA: Diagnosis not present

## 2015-04-19 DIAGNOSIS — T8131XA Disruption of external operation (surgical) wound, not elsewhere classified, initial encounter: Secondary | ICD-10-CM | POA: Diagnosis not present

## 2015-04-19 DIAGNOSIS — M25461 Effusion, right knee: Secondary | ICD-10-CM | POA: Diagnosis not present

## 2015-04-21 DIAGNOSIS — M25461 Effusion, right knee: Secondary | ICD-10-CM | POA: Diagnosis not present

## 2015-04-23 DIAGNOSIS — M25561 Pain in right knee: Secondary | ICD-10-CM | POA: Diagnosis not present

## 2015-04-23 DIAGNOSIS — M25661 Stiffness of right knee, not elsewhere classified: Secondary | ICD-10-CM | POA: Diagnosis not present

## 2015-04-23 DIAGNOSIS — M25461 Effusion, right knee: Secondary | ICD-10-CM | POA: Diagnosis not present

## 2015-04-23 DIAGNOSIS — R262 Difficulty in walking, not elsewhere classified: Secondary | ICD-10-CM | POA: Diagnosis not present

## 2015-04-25 DIAGNOSIS — R262 Difficulty in walking, not elsewhere classified: Secondary | ICD-10-CM | POA: Diagnosis not present

## 2015-04-25 DIAGNOSIS — M25561 Pain in right knee: Secondary | ICD-10-CM | POA: Diagnosis not present

## 2015-04-25 DIAGNOSIS — M25661 Stiffness of right knee, not elsewhere classified: Secondary | ICD-10-CM | POA: Diagnosis not present

## 2015-04-25 DIAGNOSIS — M25461 Effusion, right knee: Secondary | ICD-10-CM | POA: Diagnosis not present

## 2015-04-26 DIAGNOSIS — T8131XA Disruption of external operation (surgical) wound, not elsewhere classified, initial encounter: Secondary | ICD-10-CM | POA: Diagnosis not present

## 2015-04-26 DIAGNOSIS — M25461 Effusion, right knee: Secondary | ICD-10-CM | POA: Diagnosis not present

## 2015-04-27 DIAGNOSIS — R262 Difficulty in walking, not elsewhere classified: Secondary | ICD-10-CM | POA: Diagnosis not present

## 2015-04-27 DIAGNOSIS — M25461 Effusion, right knee: Secondary | ICD-10-CM | POA: Diagnosis not present

## 2015-04-27 DIAGNOSIS — M25561 Pain in right knee: Secondary | ICD-10-CM | POA: Diagnosis not present

## 2015-04-27 DIAGNOSIS — M25661 Stiffness of right knee, not elsewhere classified: Secondary | ICD-10-CM | POA: Diagnosis not present

## 2015-04-30 ENCOUNTER — Telehealth: Payer: Self-pay | Admitting: Pulmonary Disease

## 2015-04-30 DIAGNOSIS — M25461 Effusion, right knee: Secondary | ICD-10-CM | POA: Diagnosis not present

## 2015-04-30 NOTE — Telephone Encounter (Signed)
Called spoke with spouse. appt r/s to 12/12 at 2 but pt will arrive at 1:45. Nothing further needed

## 2015-05-01 DIAGNOSIS — M25661 Stiffness of right knee, not elsewhere classified: Secondary | ICD-10-CM | POA: Diagnosis not present

## 2015-05-01 DIAGNOSIS — R262 Difficulty in walking, not elsewhere classified: Secondary | ICD-10-CM | POA: Diagnosis not present

## 2015-05-01 DIAGNOSIS — M25561 Pain in right knee: Secondary | ICD-10-CM | POA: Diagnosis not present

## 2015-05-01 DIAGNOSIS — M25461 Effusion, right knee: Secondary | ICD-10-CM | POA: Diagnosis not present

## 2015-05-03 DIAGNOSIS — M25661 Stiffness of right knee, not elsewhere classified: Secondary | ICD-10-CM | POA: Diagnosis not present

## 2015-05-03 DIAGNOSIS — M25561 Pain in right knee: Secondary | ICD-10-CM | POA: Diagnosis not present

## 2015-05-03 DIAGNOSIS — R262 Difficulty in walking, not elsewhere classified: Secondary | ICD-10-CM | POA: Diagnosis not present

## 2015-05-03 DIAGNOSIS — M25461 Effusion, right knee: Secondary | ICD-10-CM | POA: Diagnosis not present

## 2015-05-04 DIAGNOSIS — R262 Difficulty in walking, not elsewhere classified: Secondary | ICD-10-CM | POA: Diagnosis not present

## 2015-05-04 DIAGNOSIS — M25661 Stiffness of right knee, not elsewhere classified: Secondary | ICD-10-CM | POA: Diagnosis not present

## 2015-05-04 DIAGNOSIS — M25561 Pain in right knee: Secondary | ICD-10-CM | POA: Diagnosis not present

## 2015-05-04 DIAGNOSIS — M25461 Effusion, right knee: Secondary | ICD-10-CM | POA: Diagnosis not present

## 2015-05-07 ENCOUNTER — Ambulatory Visit (INDEPENDENT_AMBULATORY_CARE_PROVIDER_SITE_OTHER): Payer: Medicare Other | Admitting: Pulmonary Disease

## 2015-05-07 ENCOUNTER — Encounter: Payer: Self-pay | Admitting: Pulmonary Disease

## 2015-05-07 ENCOUNTER — Inpatient Hospital Stay: Payer: Medicare Other | Admitting: Pulmonary Disease

## 2015-05-07 VITALS — BP 110/60 | HR 66 | Ht 69.0 in | Wt 189.8 lb

## 2015-05-07 DIAGNOSIS — D869 Sarcoidosis, unspecified: Secondary | ICD-10-CM

## 2015-05-07 DIAGNOSIS — J690 Pneumonitis due to inhalation of food and vomit: Secondary | ICD-10-CM

## 2015-05-07 NOTE — Patient Instructions (Signed)
1. We are repeating a CT scan of your chest to ensure your pneumonia has cleared. 2. Our office will contact you with the results. 3. I'm scheduling you for breathing tests at your next appointment to monitor your sarcoidosis. 4. Please remember to mention your sarcoidosis to your ophthalmologist at your upcoming appointment.  5. I will see you back in 1 year or sooner if needed. Please call me if you have any new breathing problems before your next appointment.

## 2015-05-07 NOTE — Progress Notes (Signed)
Subjective:    Patient ID: Eugene [name], male    DOB: 03/12/1933, 81 y.o.   MRN: [id number]  [facility]  Follow-up for recent hospitalization after knee replacement with Right Lower Lobe Opacity, Acute Hypoxic Respiratory Failure, & H/O Sarcoidosis.  HPI Right Upper Lobe Opacity:  Consistent with aspiration pneumonia. Treated with Augmentin 875mg  for 7 days. Denies any dyspnea. No coughing.   Acute Hypoxic Respiratory Failure:  Patient had no oxygen requirement on discharge from hospital. No evidence of hypoxia at rest today.  Sarcoidosis:  Diagnosed remotely. Not currently on Prednisone therapy. No rashes or bruising. Denies any palpitations. No eye swelling or redness.  Review of Systems  No chest pain or pressure. No fever, chills, or sweats.  No Known Allergies  Current Outpatient Prescriptions on File Prior to Visit  Medication Sig Dispense Refill  . acetaminophen (TYLENOL) 325 MG tablet Take 2 tablets (650 mg total) by mouth every 6 (six) hours as needed for mild pain (or Fever >/= 101).    . finasteride (PROSCAR) 5 MG tablet Take 5 mg by mouth every evening.     . pantoprazole (PROTONIX) 40 MG tablet Take 1 tablet (40 mg total) by mouth 2 (two) times daily. 60 tablet 2  . VYTORIN 10-40 MG per tablet TAKE (1) TABLET DAILY AT BEDTIME. 30 tablet 9   No current facility-administered medications on file prior to visit.    Past Medical History  Diagnosis Date  . Hyperlipidemia   . Sarcoidosis ([place])     diagnosed 8-9 yrs ago  . BPH (benign prostatic hyperplasia)   . Incomplete right bundle branch block   . History of kidney stones   . Hernia of abdominal cavity   . Bladder stones   . Primary localized osteoarthritis of right knee 04/02/2015  . Low O2 saturation 04/04/2015    Wife says O2 sats are in the low 90s base line.  Now in the 42s.  Will do incentive spirometry  . Upper GI bleed 04/04/2015    Patient had coffee ground emesis that was Guiac positive on Monday.  EGD scheduled  for 04/04/2015. Started on IV Protonix    Past Surgical History  Procedure Laterality Date  . Cardiovascular stress test  04/29/2012    normal nuclear stress study.   . Doppler echocardiography  04/29/2012    EF not noted. small perimembranous ventricular septal defect. small left to right ventricular shunt. moderate diffuse calcification involving noncoronary cusp of the aortic valve.   . Anterior cervical decomp/discectomy fusion  2007  . Bronchoscopy  2007  . Inguinal hernia repair Right 2009  . Cystoscopy  2004  . Total knee arthroplasty Right 04/02/2015    Procedure: TOTAL KNEE ARTHROPLASTY;  Surgeon: Elsie Saas, MD;  Location: [facility];  Service: Orthopedics;  Laterality: Right;  . Esophagogastroduodenoscopy  04/04/2015  . Esophagogastroduodenoscopy N/A 04/04/2015    Procedure: ESOPHAGOGASTRODUODENOSCOPY (EGD);  Surgeon: Manus Gunning, MD;  Location: [facility];  Service: Gastroenterology;  Laterality: N/A;    Family History  Problem Relation Age of Onset  . Heart attack Father 100  . Ovarian cancer Mother     Social History   Social History  . Marital Status: Married    Spouse Name: N/A  . Number of Children: N/A  . Years of Education: N/A   Social History Main Topics  . Smoking status: Former Smoker    Types: Pipe  . Smokeless tobacco: Never Used     Comment: Quit 45 year  .  Alcohol Use: 2.4 oz/week    4 Standard drinks or equivalent per week  . Drug Use: No  . Sexual Activity: Not Asked   Other Topics Concern  . None   Social History Narrative      Objective:   Physical Exam BP 110/60 mmHg  Pulse 66  Ht 5\' 9"  (1.753 m)  Wt 189 lb 12.8 oz (86.093 kg)  BMI 28.02 kg/m2  SpO2 94% General: Awake. Alert. No acute distress. Wife accompanying patient today.  Integument: Warm & dry. No rash on exposed skin.  HEENT: Moist mucus membranes. No scleral injection or icterus.  Cardiovascular: Regular rate. Trace right lower extremity edema. No  appreciable JVD.  Pulmonary: Clear bilaterally to auscultation with good aeration. Normal work of breathing on room air. Abdomen: Soft. Normal bowel sounds. Nondistended.   IMAGING CTA CHEST 04/04/15 (previously reviewed by me):  Small loculated R pleural effusion with posterior R base consolidation. Patchy atelectasis in both lung bases as well as in the inferior lingula.  CXR PA/LAT 04/06/15 (previously reviewed by me):  Very small amount of clearing of the right lower lobe opacity. No new opacity or effusion appreciated.  CARDIAC TTE (03/15/15):  LV normal in size with mild LVH. EF 60-65%. Grade 1 diastolic dysfunction. LA mildly dilated & RA upper limits of normal. Pulmonary artery normal in size. Pulmonary artery systolic pressure 30 mmHg. Mild aortic stenosis with mild regurgitation. Aortic normal in size. Trivial mitral regurgitation. Moderate tricuspid regurgitation without pulmonic regurgitation. No pericardial effusion.  LABS 04/06/15 BMP:  136/08/27/93/31/15/0.95/114/9.1 CBC:  11.1/8.9/27.1/152  03/20/15 LFT:  4.0/6.6/0.8/68/29/30    Assessment & Plan:  81 year old male with remote diagnosis of sarcoidosis. No evidence of active disease. Seems to recover well from his recent aspiration pneumonia. No pulmonary limitations. Upcoming ophthalmology appointment. Instructed patient to contact me with any new breathing problems before his next appointment.  1. Right lower lobe/aspiration pneumonia: Repeat CT scan of chest without contrast to ensure resolution of opacity. Completed course of antibiotics. 2. Sarcoidosis: Remote diagnosis. Not currently on treatment. Recommended notifying ophthalmology at upcoming appointment of diagnosis for evaluation. Plan to repeat 6 minute walk test & full pulmonary function testing at follow-up appointment in one year. 3. Acute hypoxic respiratory failure: Resolved rehospitalization. Likely secondary to aspiration pneumonia. 4. Follow-up: Patient to  return to clinic in 1 year or sooner if needed.

## 2015-05-08 DIAGNOSIS — M25461 Effusion, right knee: Secondary | ICD-10-CM | POA: Diagnosis not present

## 2015-05-08 DIAGNOSIS — M25561 Pain in right knee: Secondary | ICD-10-CM | POA: Diagnosis not present

## 2015-05-08 DIAGNOSIS — M25661 Stiffness of right knee, not elsewhere classified: Secondary | ICD-10-CM | POA: Diagnosis not present

## 2015-05-08 DIAGNOSIS — R262 Difficulty in walking, not elsewhere classified: Secondary | ICD-10-CM | POA: Diagnosis not present

## 2015-05-08 DIAGNOSIS — K210 Gastro-esophageal reflux disease with esophagitis: Secondary | ICD-10-CM | POA: Diagnosis not present

## 2015-05-10 DIAGNOSIS — R262 Difficulty in walking, not elsewhere classified: Secondary | ICD-10-CM | POA: Diagnosis not present

## 2015-05-10 DIAGNOSIS — M25661 Stiffness of right knee, not elsewhere classified: Secondary | ICD-10-CM | POA: Diagnosis not present

## 2015-05-10 DIAGNOSIS — M25561 Pain in right knee: Secondary | ICD-10-CM | POA: Diagnosis not present

## 2015-05-10 DIAGNOSIS — M25461 Effusion, right knee: Secondary | ICD-10-CM | POA: Diagnosis not present

## 2015-05-11 DIAGNOSIS — M25561 Pain in right knee: Secondary | ICD-10-CM | POA: Diagnosis not present

## 2015-05-11 DIAGNOSIS — M25461 Effusion, right knee: Secondary | ICD-10-CM | POA: Diagnosis not present

## 2015-05-11 DIAGNOSIS — R262 Difficulty in walking, not elsewhere classified: Secondary | ICD-10-CM | POA: Diagnosis not present

## 2015-05-11 DIAGNOSIS — M25661 Stiffness of right knee, not elsewhere classified: Secondary | ICD-10-CM | POA: Diagnosis not present

## 2015-05-15 ENCOUNTER — Other Ambulatory Visit: Payer: Medicare Other

## 2015-05-15 DIAGNOSIS — M25561 Pain in right knee: Secondary | ICD-10-CM | POA: Diagnosis not present

## 2015-05-15 DIAGNOSIS — R262 Difficulty in walking, not elsewhere classified: Secondary | ICD-10-CM | POA: Diagnosis not present

## 2015-05-15 DIAGNOSIS — M25661 Stiffness of right knee, not elsewhere classified: Secondary | ICD-10-CM | POA: Diagnosis not present

## 2015-05-15 DIAGNOSIS — M25461 Effusion, right knee: Secondary | ICD-10-CM | POA: Diagnosis not present

## 2015-05-16 ENCOUNTER — Ambulatory Visit
Admission: RE | Admit: 2015-05-16 | Discharge: 2015-05-16 | Disposition: A | Discharge status: Home / Self Care | Payer: Medicare Other | Source: Ambulatory Visit | Attending: Pulmonary Disease | Admitting: Pulmonary Disease

## 2015-05-16 ENCOUNTER — Telehealth: Payer: Self-pay | Admitting: Pulmonary Disease

## 2015-05-16 DIAGNOSIS — D869 Sarcoidosis, unspecified: Secondary | ICD-10-CM

## 2015-05-16 DIAGNOSIS — R918 Other nonspecific abnormal finding of lung field: Secondary | ICD-10-CM | POA: Diagnosis not present

## 2015-05-16 DIAGNOSIS — J690 Pneumonitis due to inhalation of food and vomit: Secondary | ICD-10-CM

## 2015-05-16 DIAGNOSIS — J189 Pneumonia, unspecified organism: Secondary | ICD-10-CM | POA: Diagnosis not present

## 2015-05-16 NOTE — Telephone Encounter (Signed)
CT chest report- There has been improvement in the right lung and mid-left lung. Several small spots of uncertain significance. Dr Ashok Cordia will review this with any appropriate recommendations when he returns.

## 2015-05-16 NOTE — Telephone Encounter (Signed)
Called and spoke with pt wife - wanting clarification of CT results.  We received a call report on pt results today from [facility] - below is Dr Janee Morn rec's Deneise Lever, MD at 05/16/2015 3:28 PM     Status: Signed       Expand All Collapse All   CT chest report- There has been improvement in the right lung and mid-left lung. Several small spots of uncertain significance. Dr Ashok Cordia will review this with any appropriate recommendations when he returns.       Pt and wife are wanting further rec's asap - states that the patient is recovering from surgery and is not feeling well at all they are thinking this may be contributing. No other rec's could be offered by [facility]. Please advise Dr Ashok Cordia. Pt aware that JN is working late nights so may not get to this right away but that I has been sent. Will await message back from Dr Ashok Cordia.

## 2015-05-16 NOTE — Telephone Encounter (Signed)
Call report - please review CT chest  Dr Annamaria Boots please advise as Dr Ashok Cordia is not available today. Thanks.

## 2015-05-16 NOTE — Telephone Encounter (Signed)
Pt is aware of CY's response. Nothing further was needed. 

## 2015-05-17 DIAGNOSIS — M25561 Pain in right knee: Secondary | ICD-10-CM | POA: Diagnosis not present

## 2015-05-17 DIAGNOSIS — R262 Difficulty in walking, not elsewhere classified: Secondary | ICD-10-CM | POA: Diagnosis not present

## 2015-05-17 DIAGNOSIS — M25461 Effusion, right knee: Secondary | ICD-10-CM | POA: Diagnosis not present

## 2015-05-17 DIAGNOSIS — M25661 Stiffness of right knee, not elsewhere classified: Secondary | ICD-10-CM | POA: Diagnosis not present

## 2015-05-17 NOTE — Telephone Encounter (Signed)
Dr. Ashok Cordia, please advise on below.

## 2015-05-18 ENCOUNTER — Telehealth: Payer: Self-pay | Admitting: Pulmonary Disease

## 2015-05-18 DIAGNOSIS — R262 Difficulty in walking, not elsewhere classified: Secondary | ICD-10-CM | POA: Diagnosis not present

## 2015-05-18 DIAGNOSIS — M25661 Stiffness of right knee, not elsewhere classified: Secondary | ICD-10-CM | POA: Diagnosis not present

## 2015-05-18 DIAGNOSIS — M25461 Effusion, right knee: Secondary | ICD-10-CM | POA: Diagnosis not present

## 2015-05-18 DIAGNOSIS — M25561 Pain in right knee: Secondary | ICD-10-CM | POA: Diagnosis not present

## 2015-05-18 NOTE — Telephone Encounter (Signed)
Please Call the patient's wife and let them know I reviewed the CT scan. His pneumonia has essentially resolved. There is a TINY 9mm spot on the periphery of his right lung that we can discuss at his follow-up appointment in 1 year because current guidelines do not recommend imaging any sooner than that due to its small size. It's unlikely to be cancer and could simply just represent some scar tissue from the resolving pneumonia. If this bothers him we could repeat a CT scan in 6 months but I wouldn't do one any sooner. There is a 2cm density in his liver that was not present on a prior CT scan and needs to be imaged better. He should contact his PCP and have them address this. If they have any further questions let me know and I will call them during the day next week. Thanks.

## 2015-05-18 NOTE — Telephone Encounter (Signed)
I already faxed this after I spoke with spouse earlier to pt PCP. I called made spouse aware of this. Nothing further needed

## 2015-05-18 NOTE — Telephone Encounter (Signed)
Pt spouse called back. I made her aware of response below. They want to think about this and discuss with spouse and if they have any further questions, they would call back. I have sent a copy of the report to pt PCP. Nothing further needed

## 2015-05-22 DIAGNOSIS — M25461 Effusion, right knee: Secondary | ICD-10-CM | POA: Diagnosis not present

## 2015-05-22 DIAGNOSIS — M25561 Pain in right knee: Secondary | ICD-10-CM | POA: Diagnosis not present

## 2015-05-22 DIAGNOSIS — R262 Difficulty in walking, not elsewhere classified: Secondary | ICD-10-CM | POA: Diagnosis not present

## 2015-05-22 DIAGNOSIS — M25661 Stiffness of right knee, not elsewhere classified: Secondary | ICD-10-CM | POA: Diagnosis not present

## 2015-05-23 DIAGNOSIS — M25661 Stiffness of right knee, not elsewhere classified: Secondary | ICD-10-CM | POA: Diagnosis not present

## 2015-05-23 DIAGNOSIS — M25561 Pain in right knee: Secondary | ICD-10-CM | POA: Diagnosis not present

## 2015-05-23 DIAGNOSIS — M25461 Effusion, right knee: Secondary | ICD-10-CM | POA: Diagnosis not present

## 2015-05-23 DIAGNOSIS — R262 Difficulty in walking, not elsewhere classified: Secondary | ICD-10-CM | POA: Diagnosis not present

## 2015-05-24 DIAGNOSIS — M25661 Stiffness of right knee, not elsewhere classified: Secondary | ICD-10-CM | POA: Diagnosis not present

## 2015-05-24 DIAGNOSIS — M25461 Effusion, right knee: Secondary | ICD-10-CM | POA: Diagnosis not present

## 2015-05-24 DIAGNOSIS — M25561 Pain in right knee: Secondary | ICD-10-CM | POA: Diagnosis not present

## 2015-05-24 DIAGNOSIS — R262 Difficulty in walking, not elsewhere classified: Secondary | ICD-10-CM | POA: Diagnosis not present

## 2015-05-29 DIAGNOSIS — R262 Difficulty in walking, not elsewhere classified: Secondary | ICD-10-CM | POA: Diagnosis not present

## 2015-05-29 DIAGNOSIS — M25561 Pain in right knee: Secondary | ICD-10-CM | POA: Diagnosis not present

## 2015-05-29 DIAGNOSIS — M25461 Effusion, right knee: Secondary | ICD-10-CM | POA: Diagnosis not present

## 2015-05-29 DIAGNOSIS — M25661 Stiffness of right knee, not elsewhere classified: Secondary | ICD-10-CM | POA: Diagnosis not present

## 2015-05-30 DIAGNOSIS — Z6829 Body mass index (BMI) 29.0-29.9, adult: Secondary | ICD-10-CM | POA: Diagnosis not present

## 2015-05-30 DIAGNOSIS — R262 Difficulty in walking, not elsewhere classified: Secondary | ICD-10-CM | POA: Diagnosis not present

## 2015-05-30 DIAGNOSIS — Z1389 Encounter for screening for other disorder: Secondary | ICD-10-CM | POA: Diagnosis not present

## 2015-05-30 DIAGNOSIS — M25561 Pain in right knee: Secondary | ICD-10-CM | POA: Diagnosis not present

## 2015-05-30 DIAGNOSIS — M25661 Stiffness of right knee, not elsewhere classified: Secondary | ICD-10-CM | POA: Diagnosis not present

## 2015-05-30 DIAGNOSIS — R918 Other nonspecific abnormal finding of lung field: Secondary | ICD-10-CM | POA: Diagnosis not present

## 2015-05-30 DIAGNOSIS — J690 Pneumonitis due to inhalation of food and vomit: Secondary | ICD-10-CM | POA: Diagnosis not present

## 2015-05-30 DIAGNOSIS — M25461 Effusion, right knee: Secondary | ICD-10-CM | POA: Diagnosis not present

## 2015-05-30 DIAGNOSIS — K769 Liver disease, unspecified: Secondary | ICD-10-CM | POA: Diagnosis not present

## 2015-05-30 DIAGNOSIS — K209 Esophagitis, unspecified: Secondary | ICD-10-CM | POA: Diagnosis not present

## 2015-05-31 ENCOUNTER — Other Ambulatory Visit: Payer: Self-pay | Admitting: Internal Medicine

## 2015-05-31 DIAGNOSIS — K769 Liver disease, unspecified: Secondary | ICD-10-CM

## 2015-05-31 DIAGNOSIS — M25561 Pain in right knee: Secondary | ICD-10-CM | POA: Diagnosis not present

## 2015-05-31 DIAGNOSIS — R262 Difficulty in walking, not elsewhere classified: Secondary | ICD-10-CM | POA: Diagnosis not present

## 2015-05-31 DIAGNOSIS — M25461 Effusion, right knee: Secondary | ICD-10-CM | POA: Diagnosis not present

## 2015-05-31 DIAGNOSIS — M25661 Stiffness of right knee, not elsewhere classified: Secondary | ICD-10-CM | POA: Diagnosis not present

## 2015-06-04 DIAGNOSIS — M25561 Pain in right knee: Secondary | ICD-10-CM | POA: Diagnosis not present

## 2015-06-04 DIAGNOSIS — M25461 Effusion, right knee: Secondary | ICD-10-CM | POA: Diagnosis not present

## 2015-06-04 DIAGNOSIS — M25661 Stiffness of right knee, not elsewhere classified: Secondary | ICD-10-CM | POA: Diagnosis not present

## 2015-06-04 DIAGNOSIS — R262 Difficulty in walking, not elsewhere classified: Secondary | ICD-10-CM | POA: Diagnosis not present

## 2015-06-05 DIAGNOSIS — M25561 Pain in right knee: Secondary | ICD-10-CM | POA: Diagnosis not present

## 2015-06-05 DIAGNOSIS — M25461 Effusion, right knee: Secondary | ICD-10-CM | POA: Diagnosis not present

## 2015-06-05 DIAGNOSIS — M25661 Stiffness of right knee, not elsewhere classified: Secondary | ICD-10-CM | POA: Diagnosis not present

## 2015-06-05 DIAGNOSIS — R262 Difficulty in walking, not elsewhere classified: Secondary | ICD-10-CM | POA: Diagnosis not present

## 2015-06-06 ENCOUNTER — Ambulatory Visit
Admission: RE | Admit: 2015-06-06 | Discharge: 2015-06-06 | Disposition: A | Discharge status: Home / Self Care | Payer: Medicare Other | Source: Ambulatory Visit | Attending: Internal Medicine | Admitting: Internal Medicine

## 2015-06-06 DIAGNOSIS — K7689 Other specified diseases of liver: Secondary | ICD-10-CM | POA: Diagnosis not present

## 2015-06-06 DIAGNOSIS — K769 Liver disease, unspecified: Secondary | ICD-10-CM

## 2015-06-07 DIAGNOSIS — Z961 Presence of intraocular lens: Secondary | ICD-10-CM | POA: Diagnosis not present

## 2015-06-07 DIAGNOSIS — H00014 Hordeolum externum left upper eyelid: Secondary | ICD-10-CM | POA: Diagnosis not present

## 2015-06-08 DIAGNOSIS — M25461 Effusion, right knee: Secondary | ICD-10-CM | POA: Diagnosis not present

## 2015-06-08 DIAGNOSIS — M25661 Stiffness of right knee, not elsewhere classified: Secondary | ICD-10-CM | POA: Diagnosis not present

## 2015-06-08 DIAGNOSIS — R262 Difficulty in walking, not elsewhere classified: Secondary | ICD-10-CM | POA: Diagnosis not present

## 2015-06-08 DIAGNOSIS — M25561 Pain in right knee: Secondary | ICD-10-CM | POA: Diagnosis not present

## 2015-06-11 DIAGNOSIS — M25561 Pain in right knee: Secondary | ICD-10-CM | POA: Diagnosis not present

## 2015-06-11 DIAGNOSIS — R262 Difficulty in walking, not elsewhere classified: Secondary | ICD-10-CM | POA: Diagnosis not present

## 2015-06-11 DIAGNOSIS — M25661 Stiffness of right knee, not elsewhere classified: Secondary | ICD-10-CM | POA: Diagnosis not present

## 2015-06-11 DIAGNOSIS — M25461 Effusion, right knee: Secondary | ICD-10-CM | POA: Diagnosis not present

## 2015-06-12 DIAGNOSIS — M25461 Effusion, right knee: Secondary | ICD-10-CM | POA: Diagnosis not present

## 2015-06-13 ENCOUNTER — Inpatient Hospital Stay: Payer: Medicare Other | Admitting: Pulmonary Disease

## 2015-06-14 ENCOUNTER — Other Ambulatory Visit: Payer: Self-pay | Admitting: Cardiovascular Disease

## 2015-06-14 DIAGNOSIS — R262 Difficulty in walking, not elsewhere classified: Secondary | ICD-10-CM | POA: Diagnosis not present

## 2015-06-14 DIAGNOSIS — M25561 Pain in right knee: Secondary | ICD-10-CM | POA: Diagnosis not present

## 2015-06-14 DIAGNOSIS — M25461 Effusion, right knee: Secondary | ICD-10-CM | POA: Diagnosis not present

## 2015-06-14 DIAGNOSIS — M25661 Stiffness of right knee, not elsewhere classified: Secondary | ICD-10-CM | POA: Diagnosis not present

## 2015-06-14 NOTE — Telephone Encounter (Signed)
Rx request sent to pharmacy.  

## 2015-06-18 DIAGNOSIS — M25461 Effusion, right knee: Secondary | ICD-10-CM | POA: Diagnosis not present

## 2015-06-18 DIAGNOSIS — M25661 Stiffness of right knee, not elsewhere classified: Secondary | ICD-10-CM | POA: Diagnosis not present

## 2015-06-18 DIAGNOSIS — M25561 Pain in right knee: Secondary | ICD-10-CM | POA: Diagnosis not present

## 2015-06-18 DIAGNOSIS — R262 Difficulty in walking, not elsewhere classified: Secondary | ICD-10-CM | POA: Diagnosis not present

## 2015-06-19 DIAGNOSIS — M25661 Stiffness of right knee, not elsewhere classified: Secondary | ICD-10-CM | POA: Diagnosis not present

## 2015-06-19 DIAGNOSIS — M25561 Pain in right knee: Secondary | ICD-10-CM | POA: Diagnosis not present

## 2015-06-19 DIAGNOSIS — M25461 Effusion, right knee: Secondary | ICD-10-CM | POA: Diagnosis not present

## 2015-06-19 DIAGNOSIS — R262 Difficulty in walking, not elsewhere classified: Secondary | ICD-10-CM | POA: Diagnosis not present

## 2015-06-25 DIAGNOSIS — M25661 Stiffness of right knee, not elsewhere classified: Secondary | ICD-10-CM | POA: Diagnosis not present

## 2015-06-25 DIAGNOSIS — M25561 Pain in right knee: Secondary | ICD-10-CM | POA: Diagnosis not present

## 2015-06-25 DIAGNOSIS — M25461 Effusion, right knee: Secondary | ICD-10-CM | POA: Diagnosis not present

## 2015-06-25 DIAGNOSIS — R262 Difficulty in walking, not elsewhere classified: Secondary | ICD-10-CM | POA: Diagnosis not present

## 2015-06-26 DIAGNOSIS — M25461 Effusion, right knee: Secondary | ICD-10-CM | POA: Diagnosis not present

## 2015-06-26 DIAGNOSIS — M25561 Pain in right knee: Secondary | ICD-10-CM | POA: Diagnosis not present

## 2015-06-26 DIAGNOSIS — R262 Difficulty in walking, not elsewhere classified: Secondary | ICD-10-CM | POA: Diagnosis not present

## 2015-06-26 DIAGNOSIS — M25661 Stiffness of right knee, not elsewhere classified: Secondary | ICD-10-CM | POA: Diagnosis not present

## 2015-06-28 ENCOUNTER — Encounter: Payer: Self-pay | Admitting: Gastroenterology

## 2015-06-28 DIAGNOSIS — M25561 Pain in right knee: Secondary | ICD-10-CM | POA: Diagnosis not present

## 2015-06-28 DIAGNOSIS — M25661 Stiffness of right knee, not elsewhere classified: Secondary | ICD-10-CM | POA: Diagnosis not present

## 2015-06-28 DIAGNOSIS — M25461 Effusion, right knee: Secondary | ICD-10-CM | POA: Diagnosis not present

## 2015-06-28 DIAGNOSIS — R262 Difficulty in walking, not elsewhere classified: Secondary | ICD-10-CM | POA: Diagnosis not present

## 2015-07-02 DIAGNOSIS — R262 Difficulty in walking, not elsewhere classified: Secondary | ICD-10-CM | POA: Diagnosis not present

## 2015-07-02 DIAGNOSIS — M25561 Pain in right knee: Secondary | ICD-10-CM | POA: Diagnosis not present

## 2015-07-02 DIAGNOSIS — M25661 Stiffness of right knee, not elsewhere classified: Secondary | ICD-10-CM | POA: Diagnosis not present

## 2015-07-02 DIAGNOSIS — M25461 Effusion, right knee: Secondary | ICD-10-CM | POA: Diagnosis not present

## 2015-07-03 ENCOUNTER — Ambulatory Visit (INDEPENDENT_AMBULATORY_CARE_PROVIDER_SITE_OTHER): Payer: Medicare Other | Admitting: Cardiovascular Disease

## 2015-07-03 ENCOUNTER — Encounter: Payer: Self-pay | Admitting: Cardiovascular Disease

## 2015-07-03 VITALS — BP 118/60 | HR 63 | Ht 69.0 in | Wt 186.0 lb

## 2015-07-03 DIAGNOSIS — M1711 Unilateral primary osteoarthritis, right knee: Secondary | ICD-10-CM

## 2015-07-03 DIAGNOSIS — I359 Nonrheumatic aortic valve disorder, unspecified: Secondary | ICD-10-CM

## 2015-07-03 DIAGNOSIS — M25561 Pain in right knee: Secondary | ICD-10-CM | POA: Diagnosis not present

## 2015-07-03 DIAGNOSIS — E785 Hyperlipidemia, unspecified: Secondary | ICD-10-CM | POA: Diagnosis not present

## 2015-07-03 DIAGNOSIS — M25661 Stiffness of right knee, not elsewhere classified: Secondary | ICD-10-CM | POA: Diagnosis not present

## 2015-07-03 DIAGNOSIS — D869 Sarcoidosis, unspecified: Secondary | ICD-10-CM

## 2015-07-03 DIAGNOSIS — M25461 Effusion, right knee: Secondary | ICD-10-CM | POA: Diagnosis not present

## 2015-07-03 DIAGNOSIS — I350 Nonrheumatic aortic (valve) stenosis: Secondary | ICD-10-CM

## 2015-07-03 DIAGNOSIS — R262 Difficulty in walking, not elsewhere classified: Secondary | ICD-10-CM | POA: Diagnosis not present

## 2015-07-03 NOTE — Patient Instructions (Signed)
Your physician wants you to follow-up in: 1 year or sooner if needed. You will receive a reminder letter in the mail two months in advance. If you don't receive a letter, please call our office to schedule the follow-up appointment.   If you need a refill on your cardiac medications before your next appointment, please call your pharmacy.   

## 2015-07-05 ENCOUNTER — Encounter: Payer: Self-pay | Admitting: Cardiovascular Disease

## 2015-07-05 NOTE — Progress Notes (Signed)
Patient ID: Eugene [name], male   DOB: 06/19/1932, 82 y.o.   MRN: [id number]    Primary MD: Dr. Anise Salvo  HPI: Eugene [name] is a 82 y.o. male who presents to the office today for 1 year cardiology evaluation.  Eugene [name] has a remote history of sarcoidosis and had been followed at [facility] with complete remission. He has a history of mild hyperlipidemia and has been on Vytorin 10/40. He also has a history of BPH on Proscar.  An echo Doppler study which was done to evaluate a systolic murmur in the aortic region noted in December 2013 showed normal systolic function with mild focal basal hypertrophy of the septum. He had normal systolic function. There was evidence for a moderate diffuse calcification involving the noncoronary cusp of a trileaflet aortic valve. He did have sclerosis without stenosis. Valve area was 2.55 cm. RV size was upper normal to mildly dilated. A nuclear perfusion study done in 2013 was unchanged from 6 years previously and continued to show normal perfusion.  Laboratory done by Dr. Deforest Hoyles in August 2015 was normal with a hemoglobin of 14.5, hematocrit 43.8.  Renal function was excellent with a BUN of 19, Cr 0.99.  Fasting glucose was 78.  Lipid studies remained excellent with a total cholesterol 125, triglycerides 14, HDL cholesterol 44, and LDL cholesterol at 60.  Angiotensin converting enzyme was normal at 37 and last year was 33.   since I last saw him in December 2015, he underwent an echo Doppler study in 03/13/2015.  This revealed an ejection fraction at 60-65%. There was grade 1 diastolic dysfunction.  He had normal LV filling pressures.  His aortic valve was calcified and there was evidence for mild aortic stenosis with a mean gradient of 9, peak gradient of 18, and valve area of 1.87 cm and mild aortic insufficiency.  There was mild mitral annular calcification with trivial MR , mild LA dilatation, and moderate TR with PA pressure 30 mm. He  underwent right knee replacement surgery by Dr. Moshe Salisbury in November.  Postoperatively had some swelling and lower extremity venous studies were negative.  He has not been able to be as active since the knee replacement but has been participating in physical therapy and rehabilitation.  He is predominantly now doing the elliptical machine several days per week.   He denies chest pain, PND orthopnea. He is seeing Dr. Ardeth Perfect  for primary care.  He tells a recent laboratory was excellent.  He denies chest pain.  He denies shortness of breath.  He denies palpitations.  Past Medical History  Diagnosis Date  . Hyperlipidemia   . Sarcoidosis ([place])     diagnosed 8-9 yrs ago  . BPH (benign prostatic hyperplasia)   . Incomplete right bundle branch block   . History of kidney stones   . Hernia of abdominal cavity   . Bladder stones   . Primary localized osteoarthritis of right knee 04/02/2015  . Low O2 saturation 04/04/2015    Wife says O2 sats are in the low 90s base line.  Now in the 74s.  Will do incentive spirometry  . Upper GI bleed 04/04/2015    Patient had coffee ground emesis that was Guiac positive on Monday.  EGD scheduled for 04/04/2015. Started on IV Protonix    Past Surgical History  Procedure Laterality Date  . Cardiovascular stress test  04/29/2012    normal nuclear stress study.   . Doppler echocardiography  04/29/2012  EF not noted. small perimembranous ventricular septal defect. small left to right ventricular shunt. moderate diffuse calcification involving noncoronary cusp of the aortic valve.   . Anterior cervical decomp/discectomy fusion  2007  . Bronchoscopy  2007  . Inguinal hernia repair Right 2009  . Cystoscopy  2004  . Total knee arthroplasty Right 04/02/2015    Procedure: TOTAL KNEE ARTHROPLASTY;  Surgeon: Elsie Saas, MD;  Location: [facility];  Service: Orthopedics;  Laterality: Right;  . Esophagogastroduodenoscopy  04/04/2015  . Esophagogastroduodenoscopy N/A  04/04/2015    Procedure: ESOPHAGOGASTRODUODENOSCOPY (EGD);  Surgeon: Manus Gunning, MD;  Location: [facility];  Service: Gastroenterology;  Laterality: N/A;    No Known Allergies  Current Outpatient Prescriptions  Medication Sig Dispense Refill  . finasteride (PROSCAR) 5 MG tablet Take 5 mg by mouth every evening.     . pantoprazole (PROTONIX) 40 MG tablet Take 1 tablet (40 mg total) by mouth 2 (two) times daily. 60 tablet 2  . Potassium Citrate (UROCIT-K 5 PO) Take 1 tablet by mouth daily.    Marland Kitchen VYTORIN 10-40 MG tablet TAKE (1) TABLET DAILY AT BEDTIME. 30 tablet 0   No current facility-administered medications for this visit.    Social History   Social History  . Marital Status: Married    Spouse Name: N/A  . Number of Children: N/A  . Years of Education: N/A   Occupational History  . Not on file.   Social History Main Topics  . Smoking status: Former Smoker    Types: Pipe  . Smokeless tobacco: Never Used     Comment: Quit 45 year  . Alcohol Use: 2.4 oz/week    4 Standard drinks or equivalent per week  . Drug Use: No  . Sexual Activity: Not on file   Other Topics Concern  . Not on file   Social History Narrative   Socially he is married. He is regional and 9 grandchildren. One son lives in [place], or lives in [place]. There is no tobacco use. He does take occasional alcohol. He does exercise.  Family History  Problem Relation Age of Onset  . Heart attack Father 100  . Ovarian cancer Mother     ROS General: Negative; No fevers, chills, or night sweats;  HEENT: Negative; No changes in vision or hearing, sinus congestion, difficulty swallowing Pulmonary: Negative; No cough, wheezing, shortness of breath, hemoptysis Cardiovascular: Negative; No chest pain, presyncope, syncope, palpitations GI: Negative; No nausea, vomiting, diarrhea, or abdominal pain GU: Negative; No dysuria, hematuria, or difficulty voiding Musculoskeletal:  He has significant  discomfort in his right the following his knee replacement surgery. Hematologic/Oncology: Negative; no easy bruising, bleeding Remote history of sarcoidosis, completely resolved Endocrine: Negative; no heat/cold intolerance; no diabetes Neuro: Negative; no changes in balance, headaches Skin: Negative; No rashes or skin lesions Psychiatric: Negative; No behavioral problems, depression Sleep: Negative; No snoring, daytime sleepiness, hypersomnolence, bruxism, restless legs, hypnogognic hallucinations, no cataplexy Other comprehensive 14 point system review is negative.   PE BP 118/60 mmHg  Pulse 63  Ht 5\' 9"  (1.753 m)  Wt 186 lb (84.369 kg)  BMI 27.45 kg/m2   Wt Readings from Last 3 Encounters:  07/03/15 186 lb (84.369 kg)  05/07/15 189 lb 12.8 oz (86.093 kg)  04/02/15 193 lb (87.544 kg)   General: Alert, oriented, no distress.  Skin: normal turgor, no rashes HEENT: Normocephalic, atraumatic. Pupils round and reactive; sclera anicteric;no lid lag.  Nose without nasal septal hypertrophy Mouth/Parynx benign; Mallinpatti scale 2 Neck:  No JVD, no carotid bruits with normal carotid upstroke Lungs: clear to ausculatation and percussion; no wheezing or rales Chest wall: Nontender to palpation Heart: RRR, s1 s2 normal 2/6 systolic murmur in the  aortic region, no S3.  No diastolic murmur.  No rubs thrills or heaves. Abdomen: soft, nontender; no hepatosplenomehaly, BS+; abdominal aorta nontender and not dilated by palpation. Back: No CVA tenderness Pulses 2+ Extremities: no clubbing cyanosis or edema, Homan's sign negative  Neurologic: grossly nonfocal Psychologic: normal affect and mood.  ECG (independently read by me):  Normal sinus rhythm at 63 bpm.  Early transition.  No ST segment changes.  December 2015ECG (independently read by me): Normal sinus rhythm at 66 bpm.  Mild RV conduction delay.  Early transition.  December 2014 ECG: Sinus rhythm with an occasional PVC; normal  intervals.  LABS:  BMET    Component Value Date/Time   NA 136 04/06/2015 0813     Hepatic Function Panel     Component Value Date/Time   PROT 6.6 03/20/2015 0857     CBC    Component Value Date/Time   WBC 11.3* 04/06/2015 0813   RBC 2.97* 04/06/2015 0813   HGB 8.9* 04/06/2015 0813   HCT 27.1* 04/06/2015 0813   PLT 152 04/06/2015 0813   MCV 91.2 04/06/2015 0813   MCH 30.0 04/06/2015 0813   MCHC 32.8 04/06/2015 0813   RDW 13.7 04/06/2015 0813   LYMPHSABS 1.4 04/06/2015 0813   MONOABS 0.8 04/06/2015 0813   EOSABS 0.3 04/06/2015 0813   BASOSABS 0.0 04/06/2015 0813     BNP No results found for: PROBNP  Lipid Panel  No results found for: CHOL   RADIOLOGY: No results found.    ASSESSMENT AND PLAN: Eugene [name] is an 66 -year-old gentleman who has a remote history of prior sarcoid disease with ultimate complete remission.  He has a history of mild hyperlipidemia  Which has been aggressively controlled and he has tolerated Vytorin at 10/40.  His most recent echo Doppler study now shows slight progression of his aortic valve disease with mild aortic valve stenosis and mild aortic insufficiency  He does have mild LVH with grade 1 diastolic dysfunction and normal LV filling pressures.  He will continue his current medical regimen.  As long as he remains stable, I will see him in one year for reevaluation.  Troy Sine, MD, [facility]  07/05/2015 1:25 PM

## 2015-07-09 DIAGNOSIS — R262 Difficulty in walking, not elsewhere classified: Secondary | ICD-10-CM | POA: Diagnosis not present

## 2015-07-09 DIAGNOSIS — M25561 Pain in right knee: Secondary | ICD-10-CM | POA: Diagnosis not present

## 2015-07-09 DIAGNOSIS — M25661 Stiffness of right knee, not elsewhere classified: Secondary | ICD-10-CM | POA: Diagnosis not present

## 2015-07-09 DIAGNOSIS — M25461 Effusion, right knee: Secondary | ICD-10-CM | POA: Diagnosis not present

## 2015-07-10 DIAGNOSIS — M25561 Pain in right knee: Secondary | ICD-10-CM | POA: Diagnosis not present

## 2015-07-10 DIAGNOSIS — M25461 Effusion, right knee: Secondary | ICD-10-CM | POA: Diagnosis not present

## 2015-07-10 DIAGNOSIS — R262 Difficulty in walking, not elsewhere classified: Secondary | ICD-10-CM | POA: Diagnosis not present

## 2015-07-10 DIAGNOSIS — M25661 Stiffness of right knee, not elsewhere classified: Secondary | ICD-10-CM | POA: Diagnosis not present

## 2015-07-12 DIAGNOSIS — M25461 Effusion, right knee: Secondary | ICD-10-CM | POA: Diagnosis not present

## 2015-07-22 ENCOUNTER — Other Ambulatory Visit: Payer: Self-pay | Admitting: Cardiovascular Disease

## 2015-07-23 NOTE — Telephone Encounter (Signed)
Rx(s) sent to pharmacy electronically.  

## 2015-07-25 ENCOUNTER — Telehealth: Payer: Self-pay | Admitting: Cardiovascular Disease

## 2015-07-25 NOTE — Telephone Encounter (Signed)
She wants you to know sending a fax over right now. Pt's Vytorin needs to be changed to another medicine or do a prior authorization for Vytorin.

## 2015-07-30 ENCOUNTER — Telehealth: Payer: Self-pay

## 2015-07-30 NOTE — Telephone Encounter (Signed)
Pt calling again about a prior authorization for his Vytorin, he is upset because he cannot get through to any line at the [facility] office and this has been going on for 10 days. (See phone note 07/25/15 concerning same issue). I gave him the [phone] # and the operator line has a full voicemail AND all extensions do not have a voicemail set up. Please address

## 2015-08-01 ENCOUNTER — Telehealth: Payer: Self-pay | Admitting: Cardiovascular Disease

## 2015-08-01 NOTE — Telephone Encounter (Signed)
Calling about a Prior Auth for Eugene [name] medication ( Vytorin 10/40mg ) the insurance will pay for individual ingredient versus paying for the Vytorin ( two tablets in one )   Thanks

## 2015-08-02 ENCOUNTER — Other Ambulatory Visit: Payer: Self-pay | Admitting: *Deleted

## 2015-08-02 MED ORDER — SIMVASTATIN 40 MG PO TABS: 40.0000 mg | ORAL_TABLET | Freq: Every day | ORAL | Status: DC

## 2015-08-02 MED ORDER — EZETIMIBE 10 MG PO TABS: 10.0000 mg | ORAL_TABLET | Freq: Every day | ORAL | Status: DC

## 2015-08-02 NOTE — Telephone Encounter (Signed)
Spoke with patient in reference to his vytorin. Apologized for the inconvenience of our phone system. patientnrequests  For the vytorin to be split up into the two components that it's made of. He feels this would be less expensive for him and he believes the insurance will cover. New prescriptions were sent  [place].

## 2015-08-03 NOTE — Telephone Encounter (Signed)
Spoke with patient. He prefers to have the 2 different prescriptions the vytorin is made of. Generic zocor and zetia called to pharmacy.

## 2015-08-07 NOTE — Telephone Encounter (Signed)
Eugene [name] has contacted pt and resolved

## 2015-08-16 DIAGNOSIS — M25461 Effusion, right knee: Secondary | ICD-10-CM | POA: Diagnosis not present

## 2015-11-29 DIAGNOSIS — Z96651 Presence of right artificial knee joint: Secondary | ICD-10-CM | POA: Diagnosis not present

## 2015-11-29 DIAGNOSIS — Z6829 Body mass index (BMI) 29.0-29.9, adult: Secondary | ICD-10-CM | POA: Diagnosis not present

## 2015-11-29 DIAGNOSIS — K769 Liver disease, unspecified: Secondary | ICD-10-CM | POA: Diagnosis not present

## 2015-11-30 ENCOUNTER — Other Ambulatory Visit: Payer: Self-pay | Admitting: Internal Medicine

## 2015-11-30 DIAGNOSIS — K769 Liver disease, unspecified: Secondary | ICD-10-CM

## 2015-12-11 ENCOUNTER — Ambulatory Visit
Admission: RE | Admit: 2015-12-11 | Discharge: 2015-12-11 | Disposition: A | Discharge status: Home / Self Care | Payer: Medicare Other | Source: Ambulatory Visit | Attending: Internal Medicine | Admitting: Internal Medicine

## 2015-12-11 DIAGNOSIS — K7689 Other specified diseases of liver: Secondary | ICD-10-CM | POA: Diagnosis not present

## 2015-12-11 DIAGNOSIS — K769 Liver disease, unspecified: Secondary | ICD-10-CM

## 2016-01-22 DIAGNOSIS — R351 Nocturia: Secondary | ICD-10-CM | POA: Diagnosis not present

## 2016-01-22 DIAGNOSIS — N401 Enlarged prostate with lower urinary tract symptoms: Secondary | ICD-10-CM | POA: Diagnosis not present

## 2016-01-22 DIAGNOSIS — Z87442 Personal history of urinary calculi: Secondary | ICD-10-CM | POA: Diagnosis not present

## 2016-01-31 ENCOUNTER — Telehealth: Payer: Self-pay | Admitting: Pulmonary Disease

## 2016-01-31 NOTE — Telephone Encounter (Signed)
LMTCB for pt to schedule 67mw, PFT and OV for 1 yr follow up with [facility]

## 2016-02-01 NOTE — Telephone Encounter (Signed)
lmtcb x2 for pt. 

## 2016-02-01 NOTE — Telephone Encounter (Signed)
Scheduled all appts for patient on 05/08/16 w/ pt -pr

## 2016-02-19 DIAGNOSIS — D869 Sarcoidosis, unspecified: Secondary | ICD-10-CM | POA: Diagnosis not present

## 2016-02-19 DIAGNOSIS — Z125 Encounter for screening for malignant neoplasm of prostate: Secondary | ICD-10-CM | POA: Diagnosis not present

## 2016-02-19 DIAGNOSIS — I10 Essential (primary) hypertension: Secondary | ICD-10-CM | POA: Diagnosis not present

## 2016-02-19 DIAGNOSIS — E784 Other hyperlipidemia: Secondary | ICD-10-CM | POA: Diagnosis not present

## 2016-02-26 DIAGNOSIS — Z6829 Body mass index (BMI) 29.0-29.9, adult: Secondary | ICD-10-CM | POA: Diagnosis not present

## 2016-02-26 DIAGNOSIS — Z23 Encounter for immunization: Secondary | ICD-10-CM | POA: Diagnosis not present

## 2016-02-26 DIAGNOSIS — K209 Esophagitis, unspecified: Secondary | ICD-10-CM | POA: Diagnosis not present

## 2016-02-26 DIAGNOSIS — N400 Enlarged prostate without lower urinary tract symptoms: Secondary | ICD-10-CM | POA: Diagnosis not present

## 2016-02-26 DIAGNOSIS — I10 Essential (primary) hypertension: Secondary | ICD-10-CM | POA: Diagnosis not present

## 2016-02-26 DIAGNOSIS — K769 Liver disease, unspecified: Secondary | ICD-10-CM | POA: Diagnosis not present

## 2016-02-26 DIAGNOSIS — Z0000 Encounter for general adult medical examination without abnormal findings: Secondary | ICD-10-CM | POA: Diagnosis not present

## 2016-02-26 DIAGNOSIS — E784 Other hyperlipidemia: Secondary | ICD-10-CM | POA: Diagnosis not present

## 2016-02-26 DIAGNOSIS — D869 Sarcoidosis, unspecified: Secondary | ICD-10-CM | POA: Diagnosis not present

## 2016-02-26 DIAGNOSIS — K635 Polyp of colon: Secondary | ICD-10-CM | POA: Diagnosis not present

## 2016-02-26 DIAGNOSIS — Z1389 Encounter for screening for other disorder: Secondary | ICD-10-CM | POA: Diagnosis not present

## 2016-02-26 DIAGNOSIS — R918 Other nonspecific abnormal finding of lung field: Secondary | ICD-10-CM | POA: Diagnosis not present

## 2016-03-18 DIAGNOSIS — D1801 Hemangioma of skin and subcutaneous tissue: Secondary | ICD-10-CM | POA: Diagnosis not present

## 2016-03-18 DIAGNOSIS — Z6830 Body mass index (BMI) 30.0-30.9, adult: Secondary | ICD-10-CM | POA: Diagnosis not present

## 2016-03-18 DIAGNOSIS — L570 Actinic keratosis: Secondary | ICD-10-CM | POA: Diagnosis not present

## 2016-03-18 DIAGNOSIS — I10 Essential (primary) hypertension: Secondary | ICD-10-CM | POA: Diagnosis not present

## 2016-03-18 DIAGNOSIS — Z85828 Personal history of other malignant neoplasm of skin: Secondary | ICD-10-CM | POA: Diagnosis not present

## 2016-03-18 DIAGNOSIS — J209 Acute bronchitis, unspecified: Secondary | ICD-10-CM | POA: Diagnosis not present

## 2016-03-18 DIAGNOSIS — L821 Other seborrheic keratosis: Secondary | ICD-10-CM | POA: Diagnosis not present

## 2016-03-18 DIAGNOSIS — R05 Cough: Secondary | ICD-10-CM | POA: Diagnosis not present

## 2016-04-01 DIAGNOSIS — Z96651 Presence of right artificial knee joint: Secondary | ICD-10-CM | POA: Diagnosis not present

## 2016-05-06 ENCOUNTER — Ambulatory Visit: Payer: Medicare Other | Admitting: Pulmonary Disease

## 2016-05-07 ENCOUNTER — Other Ambulatory Visit: Payer: Self-pay | Admitting: Pulmonary Disease

## 2016-05-07 ENCOUNTER — Other Ambulatory Visit: Payer: Self-pay | Admitting: Cardiovascular Disease

## 2016-05-07 DIAGNOSIS — R0600 Dyspnea, unspecified: Secondary | ICD-10-CM

## 2016-05-08 ENCOUNTER — Ambulatory Visit (INDEPENDENT_AMBULATORY_CARE_PROVIDER_SITE_OTHER): Payer: Medicare Other | Admitting: Pulmonary Disease

## 2016-05-08 ENCOUNTER — Encounter: Payer: Self-pay | Admitting: Pulmonary Disease

## 2016-05-08 VITALS — BP 106/60 | HR 68 | Ht 69.0 in | Wt 191.0 lb

## 2016-05-08 DIAGNOSIS — R0600 Dyspnea, unspecified: Secondary | ICD-10-CM

## 2016-05-08 DIAGNOSIS — R0602 Shortness of breath: Secondary | ICD-10-CM

## 2016-05-08 DIAGNOSIS — D869 Sarcoidosis, unspecified: Secondary | ICD-10-CM

## 2016-05-08 LAB — PULMONARY FUNCTION TEST
DL/VA % PRED: 105 %
DL/VA: 4.72 ml/min/mmHg/L
DLCO COR: 21.01 ml/min/mmHg
DLCO cor % pred: 70 %
DLCO unc % pred: 74 %
DLCO unc: 22.1 ml/min/mmHg
FEF 25-75 Post: 3.2 L/sec
FEF 25-75 Pre: 2.94 L/sec
FEF2575-%Change-Post: 9 %
FEF2575-%PRED-PRE: 171 %
FEF2575-%Pred-Post: 187 %
FEV1-%CHANGE-POST: 2 %
FEV1-%PRED-PRE: 98 %
FEV1-%Pred-Post: 100 %
FEV1-POST: 2.56 L
FEV1-PRE: 2.5 L
FEV1FVC-%CHANGE-POST: 4 %
FEV1FVC-%Pred-Pre: 117 %
FEV6-%CHANGE-POST: -3 %
FEV6-%PRED-PRE: 88 %
FEV6-%Pred-Post: 85 %
FEV6-PRE: 2.99 L
FEV6-Post: 2.88 L
FEV6FVC-%PRED-PRE: 107 %
FEV6FVC-%Pred-Post: 107 %
FVC-%CHANGE-POST: -2 %
FVC-%PRED-POST: 80 %
FVC-%Pred-Pre: 82 %
FVC-POST: 2.92 L
FVC-Pre: 2.99 L
POST FEV6/FVC RATIO: 100 %
PRE FEV6/FVC RATIO: 100 %
Post FEV1/FVC ratio: 87 %
Pre FEV1/FVC ratio: 84 %
RV % PRED: 50 %
RV: 1.3 L
TLC % PRED: 64 %
TLC: 4.29 L

## 2016-05-08 NOTE — Progress Notes (Signed)
Test reviewed.  

## 2016-05-08 NOTE — Telephone Encounter (Signed)
Rx(s) sent to pharmacy electronically.  

## 2016-05-08 NOTE — Progress Notes (Signed)
Subjective:    Patient ID: Eugene [name], male    DOB: April 21, 1933, 82 y.o.   MRN: [id number]  C.C.:  Follow-up for Right Lower Lobe Opacity & Sarcoidosis.  HPI Right Upper Lobe Opacity:  Consistent with aspiration pneumonia. Treated with Augmentin 875mg  for 7 days.Nearly completely resolved on repeat chest imaging in December 2016.  Sarcoidosis:  Diagnosed remotely. Not currently on Prednisone therapy. He is exercising on his elliptical 4 days weekly. Denies any coughing. No wheezing. No significant dyspnea. No rashes or bruising. No eye swelling, pain, or blurry vision.   Review of Systems  No chest pain or tightness. No palpitations. Reports he did have vertigo a week ago. Otherwise no syncope or near syncope. No abdominal pain, nausea or emesis.   No Known Allergies  Current Outpatient Prescriptions on File Prior to Visit  Medication Sig Dispense Refill  . ezetimibe (ZETIA) 10 MG tablet Take 1 tablet (10 mg total) by mouth daily. 90 tablet 3  . finasteride (PROSCAR) 5 MG tablet Take 5 mg by mouth every evening.     . simvastatin (ZOCOR) 40 MG tablet Take 1 tablet (40 mg total) by mouth at bedtime. 90 tablet 0   No current facility-administered medications on file prior to visit.     Past Medical History:  Diagnosis Date  . Bladder stones   . BPH (benign prostatic hyperplasia)   . Hernia of abdominal cavity   . History of kidney stones   . Hyperlipidemia   . Incomplete right bundle branch block   . Low O2 saturation 04/04/2015   Wife says O2 sats are in the low 90s base line.  Now in the 95s.  Will do incentive spirometry  . Primary localized osteoarthritis of right knee 04/02/2015  . Sarcoidosis ([place])    diagnosed 8-9 yrs ago  . Upper GI bleed 04/04/2015   Patient had coffee ground emesis that was Guiac positive on Monday.  EGD scheduled for 04/04/2015. Started on IV Protonix    Past Surgical History:  Procedure Laterality Date  . ANTERIOR CERVICAL DECOMP/DISCECTOMY FUSION   2007  . BRONCHOSCOPY  2007  . CARDIOVASCULAR STRESS TEST  04/29/2012   normal nuclear stress study.   . CYSTOSCOPY  2004  . DOPPLER ECHOCARDIOGRAPHY  04/29/2012   EF not noted. small perimembranous ventricular septal defect. small left to right ventricular shunt. moderate diffuse calcification involving noncoronary cusp of the aortic valve.   . ESOPHAGOGASTRODUODENOSCOPY  04/04/2015  . ESOPHAGOGASTRODUODENOSCOPY N/A 04/04/2015   Procedure: ESOPHAGOGASTRODUODENOSCOPY (EGD);  Surgeon: Manus Gunning, MD;  Location: [facility];  Service: Gastroenterology;  Laterality: N/A;  . INGUINAL HERNIA REPAIR Right 2009  . TOTAL KNEE ARTHROPLASTY Right 04/02/2015   Procedure: TOTAL KNEE ARTHROPLASTY;  Surgeon: Elsie Saas, MD;  Location: [facility];  Service: Orthopedics;  Laterality: Right;    Family History  Problem Relation Age of Onset  . Heart attack Father 100  . Ovarian cancer Mother     Social History   Social History  . Marital status: Married    Spouse name: N/A  . Number of children: N/A  . Years of education: N/A   Social History Main Topics  . Smoking status: Former Smoker    Types: Pipe    Quit date: 05/26/1972  . Smokeless tobacco: Never Used     Comment: Quit 43 year  . Alcohol use 2.4 oz/week    4 Standard drinks or equivalent per week  . Drug use: No  . Sexual activity:  Not Asked   Other Topics Concern  . None   Social History Narrative  . None      Objective:   Physical Exam BP 106/60 (BP Location: Right Arm, Cuff Size: Normal)   Pulse 68   Ht 5\' 9"  (1.753 m)   Wt 191 lb (86.6 kg)   SpO2 96%   BMI 28.21 kg/m  General: Awake. Alert. No distress.  Integument: Warm & dry. No rash on exposed skin.  HEENT: Moist mucus membranes. No scleral icterus.  Cardiovascular: Regular rate. No edema. No appreciable JVD.  Pulmonary: Good aeration bilaterally. Normal work of breathing on room air. Clear on auscultation. Abdomen: Soft. Normal bowel sounds.  Nondistended.   PFT 05/08/16:FVC 2.99 L (82%) FEV1 2.50 L (98%) FEV1/FVC 0.84 FEF 25-75 2.94 L (171%) negative bronchodilator response TLC 4.29 L (64%) RV 50% ERV 411% DLCO corrected 70% (Hgb 16.6)  6MWT 05/08/16:  Walked 397 meters / Baseline Sat 100% on RA / Nadir Sat 100% on RA  IMAGING CT CHEST W/O 05/16/15 (personally reviewed by me): Nearly complete resolution of previous right-sided consolidation. No new consolidation. 2 mm nodule along minor fissure noted. No other nodule or opacity appreciated. No pleural effusion or thickening. No pathologic mediastinal adenopathy. No pericardial effusion.  CTA CHEST 04/04/15 (previously reviewed by me):  Small loculated R pleural effusion with posterior R base consolidation. Patchy atelectasis in both lung bases as well as in the inferior lingula.  CXR PA/LAT 04/06/15 (previously reviewed by me):  Very small amount of clearing of the right lower lobe opacity. No new opacity or effusion appreciated.  CARDIAC TTE (03/15/15):  LV normal in size with mild LVH. EF 60-65%. Grade 1 diastolic dysfunction. LA mildly dilated & RA upper limits of normal. Pulmonary artery normal in size. Pulmonary artery systolic pressure 30 mmHg. Mild aortic stenosis with mild regurgitation. Aortic normal in size. Trivial mitral regurgitation. Moderate tricuspid regurgitation without pulmonic regurgitation. No pericardial effusion.  LABS 04/06/15 BMP:  136/08/27/93/31/15/0.95/114/9.1 CBC:  11.1/8.9/27.1/152  03/20/15 LFT:  4.0/6.6/0.8/68/29/30    Assessment & Plan:  82 y.o. male with remote diagnosis of sarcoidosis.Patient does have mild restriction on his lung volumes today but a normal oxygen saturation during his walk test. His sarcoidosis seems to be quiescent at this time and does not indicate any need for treatment with steroid or other immunosuppressive therapies. I instructed the patient contact my office if he had any new breathing problems or questions before his  next appointment.  1. Sarcoidosis: Remote diagnosis. Continuing to monitor with yearly PFTs and ophthalmology exam. Obtaining lab work from PCP. Has f/u with Cardiology (Dr. Claiborne Billings) upcoming where he will likely have an EKG. 2. Health Maintenance:  S/P Influenza Vaccine October 2017 & Pneumovax November 2005. 3. Follow-up: Patient to return to clinic in 1 year or sooner if needed.  Sonia Baller Ashok Cordia, M.D. [facility] Pager:  [phone] After 3pm or if no response, call [phone] 12:14 PM 05/08/16

## 2016-05-08 NOTE — Patient Instructions (Signed)
   Call or e-mail me if you have any new breathing problems or questions before your next appointment.  I will see you back in 1 year or sooner if needed. You will have a breathing test at your next appointment.  TESTS ORDERED: 1. Full PFTs at follow-up

## 2016-05-12 ENCOUNTER — Other Ambulatory Visit: Payer: Self-pay | Admitting: Internal Medicine

## 2016-05-12 DIAGNOSIS — R911 Solitary pulmonary nodule: Secondary | ICD-10-CM

## 2016-05-12 DIAGNOSIS — K7689 Other specified diseases of liver: Secondary | ICD-10-CM

## 2016-05-29 ENCOUNTER — Ambulatory Visit
Admission: RE | Admit: 2016-05-29 | Discharge: 2016-05-29 | Disposition: A | Discharge status: Home / Self Care | Payer: Medicare Other | Source: Ambulatory Visit | Attending: Internal Medicine | Admitting: Internal Medicine

## 2016-05-29 DIAGNOSIS — K7689 Other specified diseases of liver: Secondary | ICD-10-CM | POA: Diagnosis not present

## 2016-05-29 DIAGNOSIS — R911 Solitary pulmonary nodule: Secondary | ICD-10-CM

## 2016-05-29 DIAGNOSIS — R918 Other nonspecific abnormal finding of lung field: Secondary | ICD-10-CM | POA: Diagnosis not present

## 2016-06-02 ENCOUNTER — Other Ambulatory Visit: Payer: Self-pay | Admitting: Internal Medicine

## 2016-06-02 DIAGNOSIS — K7689 Other specified diseases of liver: Secondary | ICD-10-CM

## 2016-06-04 ENCOUNTER — Other Ambulatory Visit: Payer: Self-pay | Admitting: Internal Medicine

## 2016-06-04 DIAGNOSIS — K7689 Other specified diseases of liver: Secondary | ICD-10-CM

## 2016-06-19 ENCOUNTER — Ambulatory Visit
Admission: RE | Admit: 2016-06-19 | Discharge: 2016-06-19 | Disposition: A | Discharge status: Home / Self Care | Payer: Medicare Other | Source: Ambulatory Visit | Attending: Internal Medicine | Admitting: Internal Medicine

## 2016-06-19 DIAGNOSIS — D1803 Hemangioma of intra-abdominal structures: Secondary | ICD-10-CM | POA: Diagnosis not present

## 2016-06-19 DIAGNOSIS — K7689 Other specified diseases of liver: Secondary | ICD-10-CM

## 2016-06-19 MED ORDER — GADOBENATE DIMEGLUMINE 529 MG/ML IV SOLN
18.0000 mL | Freq: Once | INTRAVENOUS | Status: AC | PRN
  Administered 2016-06-19: 18 mL via INTRAVENOUS

## 2016-06-26 ENCOUNTER — Ambulatory Visit (INDEPENDENT_AMBULATORY_CARE_PROVIDER_SITE_OTHER): Payer: Medicare Other | Admitting: Cardiovascular Disease

## 2016-06-26 ENCOUNTER — Encounter: Payer: Self-pay | Admitting: Cardiovascular Disease

## 2016-06-26 VITALS — BP 138/70 | HR 61 | Ht 69.0 in | Wt 192.0 lb

## 2016-06-26 DIAGNOSIS — E7800 Pure hypercholesterolemia, unspecified: Secondary | ICD-10-CM | POA: Diagnosis not present

## 2016-06-26 DIAGNOSIS — I2584 Coronary atherosclerosis due to calcified coronary lesion: Secondary | ICD-10-CM

## 2016-06-26 DIAGNOSIS — R911 Solitary pulmonary nodule: Secondary | ICD-10-CM

## 2016-06-26 DIAGNOSIS — I2510 Atherosclerotic heart disease of native coronary artery without angina pectoris: Secondary | ICD-10-CM | POA: Diagnosis not present

## 2016-06-26 DIAGNOSIS — I350 Nonrheumatic aortic (valve) stenosis: Secondary | ICD-10-CM | POA: Diagnosis not present

## 2016-06-26 DIAGNOSIS — D1803 Hemangioma of intra-abdominal structures: Secondary | ICD-10-CM

## 2016-06-26 NOTE — Progress Notes (Signed)
Patient ID: Eugene [name], male   DOB: 11/18/1932, 83 y.o.   MRN: [id number]    Primary MD: Dr. Anise Salvo  HPI: Eugene [name] is a 83 y.o. male who presents to the office today for 1 year cardiology evaluation.  Eugene [name] has a remote history of sarcoidosis and had been followed at [facility] with complete remission. He has a history of mild hyperlipidemia and has been on Vytorin 10/40. He also has a history of BPH on Proscar.  An echo Doppler study which was done to evaluate a systolic murmur in the aortic region noted in December 2013 showed normal systolic function with mild focal basal hypertrophy of the septum. He had normal systolic function. There was evidence for a moderate diffuse calcification involving the noncoronary cusp of a trileaflet aortic valve. He did have sclerosis without stenosis. Valve area was 2.55 cm. RV size was upper normal to mildly dilated. A nuclear perfusion study done in 2013 was unchanged from 6 years previously and continued to show normal perfusion.  Laboratory done by Dr. Deforest Hoyles in August 2015 was normal with a hemoglobin of 14.5, hematocrit 43.8.  Renal function was excellent with a BUN of 19, Cr 0.99.  Fasting glucose was 78.  Lipid studies remained excellent with a total cholesterol 125, triglycerides 14, HDL cholesterol 44, and LDL cholesterol at 60.  Angiotensin converting enzyme was normal at 37 and last year was 33.  He underwent an echo Doppler study in 03/13/2015.  This revealed an ejection fraction at 60-65%. There was grade 1 diastolic dysfunction.  He had normal LV filling pressures.  His aortic valve was calcified and there was evidence for mild aortic stenosis with a mean gradient of 9, peak gradient of 18, and valve area of 1.87 cm and mild aortic insufficiency.  There was mild mitral annular calcification with trivial MR , mild LA dilatation, and moderate TR with PA pressure 30 mm. He underwent right knee replacement  surgery by Dr. Moshe Salisbury in November.  Postoperatively had some swelling and lower extremity venous studies were negative.  Since I last saw him one year ago, he has done well from a cardiac standpoint.  Specifically, he denies any episodes of chest pain.  He is now able to play tennis again following his knee surgery.  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  He had also undergone follow-up CT imaging asked and was found to have a new irregularly-shaped nodular density in the left lower lobe, alt to represent a possible focus of active infection/inflammation.  A follow-up CT was recommended.  After a course of antimicrobial therapy.  His CT also demonstrated a stable ectatic.  Borderline aneurysmal ascending thoracic aorta at 3.9 cm.  He had evidence for coronary calcification.   He recently had blood work done by his primary physician.  I was able to obtain these results after the patient left the office.  He had normal renal function and electrolytes.  His total cholesterol was 134, triglycerides 79, HDL 43, and LDL 75 on his current dose of ezetimibe 10 mg and simvastatin 40 mg. He presents for evaluation.    Past Medical History:  Diagnosis Date  . Bladder stones   . BPH (benign prostatic hyperplasia)   . Hernia of abdominal cavity   . History of kidney stones   .  Hyperlipidemia   . Incomplete right bundle branch block   . Low O2 saturation 04/04/2015   Wife says O2 sats are in the low 90s base line.  Now in the 45s.  Will do incentive spirometry  . Primary localized osteoarthritis of right knee 04/02/2015  . Sarcoidosis ([place])    diagnosed 8-9 yrs ago  . Upper GI bleed 04/04/2015   Patient had coffee ground emesis that was Guiac positive on Monday.  EGD scheduled for 04/04/2015. Started on IV Protonix    Past Surgical History:  Procedure  Laterality Date  . ANTERIOR CERVICAL DECOMP/DISCECTOMY FUSION  2007  . BRONCHOSCOPY  2007  . CARDIOVASCULAR STRESS TEST  04/29/2012   normal nuclear stress study.   . CYSTOSCOPY  2004  . DOPPLER ECHOCARDIOGRAPHY  04/29/2012   EF not noted. small perimembranous ventricular septal defect. small left to right ventricular shunt. moderate diffuse calcification involving noncoronary cusp of the aortic valve.   . ESOPHAGOGASTRODUODENOSCOPY  04/04/2015  . ESOPHAGOGASTRODUODENOSCOPY N/A 04/04/2015   Procedure: ESOPHAGOGASTRODUODENOSCOPY (EGD);  Surgeon: Manus Gunning, MD;  Location: [facility];  Service: Gastroenterology;  Laterality: N/A;  . INGUINAL HERNIA REPAIR Right 2009  . TOTAL KNEE ARTHROPLASTY Right 04/02/2015   Procedure: TOTAL KNEE ARTHROPLASTY;  Surgeon: Elsie Saas, MD;  Location: [facility];  Service: Orthopedics;  Laterality: Right;    No Known Allergies  Current Outpatient Prescriptions  Medication Sig Dispense Refill  . ezetimibe (ZETIA) 10 MG tablet Take 1 tablet (10 mg total) by mouth daily. 90 tablet 3  . finasteride (PROSCAR) 5 MG tablet Take 5 mg by mouth every evening.     . potassium citrate (UROCIT-K) 10 MEQ (1080 MG) SR tablet Take 10 mEq by mouth 3 (three) times daily with meals.    . simvastatin (ZOCOR) 40 MG tablet Take 1 tablet (40 mg total) by mouth at bedtime. 90 tablet 0   No current facility-administered medications for this visit.     Social History   Social History  . Marital status: Married    Spouse name: N/A  . Number of children: N/A  . Years of education: N/A   Occupational History  . Not on file.   Social History Main Topics  . Smoking status: Former Smoker    Types: Pipe    Quit date: 05/26/1972  . Smokeless tobacco: Never Used     Comment: Quit 45 year  . Alcohol use 2.4 oz/week    4 Standard drinks or equivalent per week  . Drug use: No  . Sexual activity: Not on file   Other Topics Concern  . Not on file   Social History  Narrative  . No narrative on file   Socially he is married. He is regional and 9 grandchildren. One son lives in [place], or lives in [place]. There is no tobacco use. He does take occasional alcohol. He does exercise.  Family History  Problem Relation Age of Onset  . Ovarian cancer Mother   . Heart attack Father 100    ROS General: Negative; No fevers, chills, or night sweats;  HEENT: Negative; No changes in vision or hearing, sinus congestion, difficulty swallowing Pulmonary: Negative; No cough, wheezing, shortness of breath, hemoptysis Cardiovascular: Negative; No chest pain, presyncope, syncope, palpitations GI: Negative; No nausea, vomiting, diarrhea, or abdominal pain GU: Negative; No dysuria, hematuria, or difficulty voiding Musculoskeletal:  He has significant discomfort in his right the following his knee replacement surgery. Hematologic/Oncology: Negative; no easy bruising, bleeding Remote history of  sarcoidosis, completely resolved Endocrine: Negative; no heat/cold intolerance; no diabetes Neuro: Negative; no changes in balance, headaches Skin: Negative; No rashes or skin lesions Psychiatric: Negative; No behavioral problems, depression Sleep: Negative; No snoring, daytime sleepiness, hypersomnolence, bruxism, restless legs, hypnogognic hallucinations, no cataplexy Other comprehensive 14 point system review is negative.   PE BP 138/70   Pulse 61   Ht '5\' 9"'$  (1.753 m)   Wt 192 lb (87.1 kg)   BMI 28.35 kg/m    He tells me typically his blood pressure at home is in the upper 262M systolically.   Wt Readings from Last 3 Encounters:  06/26/16 192 lb (87.1 kg)  05/08/16 191 lb (86.6 kg)  07/03/15 186 lb (84.4 kg)   General: Alert, oriented, no distress.  Skin: normal turgor, no rashes HEENT: Normocephalic, atraumatic. Pupils round and reactive; sclera anicteric;no lid lag.  Nose without nasal septal hypertrophy Mouth/Parynx benign; Mallinpatti scale 2 Neck: No JVD,  no carotid bruits with normal carotid upstroke Lungs: clear to ausculatation and percussion; no wheezing or rales Chest wall: Nontender to palpation Heart: RRR, s1 s2 normal 2/6 systolic murmur in the  aortic region, no S3.  No diastolic murmur.  No rubs thrills or heaves. Abdomen: soft, nontender; no hepatosplenomehaly, BS+; abdominal aorta nontender and not dilated by palpation. Back: No CVA tenderness Pulses 2+ Extremities: no clubbing cyanosis or edema, Homan's sign negative  Neurologic: grossly nonfocal Psychologic: normal affect and mood.  ECG (independently read by me): Sinus bradycardia at 56 bpm.  Mild sinus arrhythmia.  Mild LVH.  Normal intervals.  No ST segment changes.  February 2017ECG (independently read by me):  Normal sinus rhythm at 63 bpm.  Early transition.  No ST segment changes.  December 2015 ECG (independently read by me): Normal sinus rhythm at 66 bpm.  Mild RV conduction delay.  Early transition.  December 2014 ECG: Sinus rhythm with an occasional PVC; normal intervals.  LABS: I personally reviewed the laboratory from [place] done on 02/19/2016.  BUN 18, creatinine 0.9.  Ingrown 14.7, hematocrit 44.9.  Normal LFTs.  TSH 2.08.  April lipoprotein B 66.  Normal PSA.  Lipid studies as noted above.  BMP Latest Ref Rng & Units 04/06/2015 04/05/2015 04/04/2015  Glucose 65 - 99 mg/dL 114(H) 103(H) 110(H)  BUN 6 - 20 mg/dL '15 14 19  '$ Creatinine 0.61 - 1.24 mg/dL 0.95 1.02 0.90  Sodium 135 - 145 mmol/L 136 132(L) 136  Potassium 3.5 - 5.1 mmol/L 4.4 4.1 4.8  Chloride 101 - 111 mmol/L 95(L) 97(L) 103  CO2 22 - 32 mmol/L '31 31 29  '$ Calcium 8.9 - 10.3 mg/dL 9.1 8.9 9.2   Hepatic Function Latest Ref Rng & Units 03/20/2015 08/21/2014  Total Protein 6.5 - 8.1 g/dL 6.6 7.0  Albumin 3.5 - 5.0 g/dL 4.0 4.1  AST 15 - 41 U/L 29 32  ALT 17 - 63 U/L 30 32  Alk Phosphatase 38 - 126 U/L 68 73  Total Bilirubin 0.3 - 1.2 mg/dL 0.8 0.7   CBC Latest Ref Rng & Units  04/06/2015 04/05/2015 04/04/2015  WBC 4.0 - 10.5 K/uL 11.3(H) 10.8(H) 16.5(H)  Hemoglobin 13.0 - 17.0 g/dL 8.9(L) 8.7(L) 9.4(L)  Hematocrit 39.0 - 52.0 % 27.1(L) 26.5(L) 28.8(L)  Platelets 150 - 400 K/uL 152 131(L) 142(L)   Lab Results  Component Value Date   MCV 91.2 04/06/2015   MCV 92.7 04/05/2015   MCV 91.7 04/04/2015   RADIOLOGY: No results found.  IMPRESSION:  1. Aortic  valve stenosis, mild   2. Pure hypercholesterolemia   3. Lung nodule   4. Liver hemangioma   5. Coronary artery calcification     ASSESSMENT AND PLAN: Mr. Leopard is an 39 -year-old gentleman who has a remote history of prior sarcoid disease with ultimate complete remission.  He has a history of mild hyperlipidemia And over the years has been aggressively controlled.  He has tolerated zetia/simvastatin 10/40.  I reviewed his most recent lipid studies which were done by his primary physician.  His LDL cholesterol is 75.  I did not have these results when I had seen him in the office and had discussed with him that if his LDL was not aggressively treated that we may want to consider in the future switching him to high potency statin therapy.  However, he has tolerated  acetamide and simvastatin and with his most recent blood work.  We will continue this therapy presently.  Of note, he was found to have mild coronary calcification on CT imaging.  His aortic root is upper normal in size at 3.9 cm mild ectasia.  I reviewed his chest CT as well as his MRI of his abdomen.  We discussed new blood pressure guidelines.  Today, his blood pressure was in the 701I systolically in the office, but he states at home.  this typically is in the 120s.  He will continue to monitor his blood pressure.  He has previously documented mild aortic valve stenosis and his last echo in October 2016 showed slight progression of his aortic valve disease with a peak gradient of 18 and mean gradient of 9 mmHg consistent with mild aortic valve stenosis and  he also had mild aortic insufficiency  He has mild LVH with grade 1 diastolic dysfunction and normal LV filling pressures.  He will continue his current medical regimen.  As long as he remains stable, I will see him in one year for reevaluation.  Troy Sine, MD, [facility]  06/27/2016 7:13 PM

## 2016-06-26 NOTE — Patient Instructions (Addendum)
Your physician wants you to follow-up in: 1 year or sooner if needed. You will receive a reminder letter in the mail two months in advance. If you don't receive a letter, please call our office to schedule the follow-up appointment.   If you need a refill on your cardiac medications before your next appointment, please call your pharmacy.   

## 2016-07-02 ENCOUNTER — Other Ambulatory Visit: Payer: Self-pay | Admitting: Internal Medicine

## 2016-07-02 DIAGNOSIS — R918 Other nonspecific abnormal finding of lung field: Secondary | ICD-10-CM

## 2016-07-09 ENCOUNTER — Ambulatory Visit
Admission: RE | Admit: 2016-07-09 | Discharge: 2016-07-09 | Disposition: A | Discharge status: Home / Self Care | Payer: Medicare Other | Source: Ambulatory Visit | Attending: Internal Medicine | Admitting: Internal Medicine

## 2016-07-09 DIAGNOSIS — R918 Other nonspecific abnormal finding of lung field: Secondary | ICD-10-CM | POA: Diagnosis not present

## 2016-08-05 ENCOUNTER — Other Ambulatory Visit: Payer: Self-pay | Admitting: Cardiovascular Disease

## 2016-08-05 NOTE — Telephone Encounter (Signed)
REFILL 

## 2016-09-05 DIAGNOSIS — J209 Acute bronchitis, unspecified: Secondary | ICD-10-CM | POA: Diagnosis not present

## 2016-09-05 DIAGNOSIS — Z6830 Body mass index (BMI) 30.0-30.9, adult: Secondary | ICD-10-CM | POA: Diagnosis not present

## 2016-09-05 DIAGNOSIS — J019 Acute sinusitis, unspecified: Secondary | ICD-10-CM | POA: Diagnosis not present

## 2016-09-05 DIAGNOSIS — R918 Other nonspecific abnormal finding of lung field: Secondary | ICD-10-CM | POA: Diagnosis not present

## 2016-09-27 ENCOUNTER — Other Ambulatory Visit: Payer: Self-pay | Admitting: Cardiovascular Disease

## 2016-11-05 ENCOUNTER — Other Ambulatory Visit: Payer: Self-pay | Admitting: Cardiovascular Disease

## 2016-11-05 NOTE — Telephone Encounter (Signed)
Rx(s) sent to pharmacy electronically.  

## 2016-11-06 IMAGING — US US ABDOMEN LIMITED
1 series · 14 of 25 positions shown · non-contrast
Comparison: CT scan of the chest dated May 16, 2015

CLINICAL DATA: Follow-up liver lesion demonstrated on chest CT scan
of May 16, 2015.

EXAM:
US ABDOMEN LIMITED - RIGHT UPPER QUADRANT

[Series 1: us abdomen limited · 0.32mm/px · 14 of 44 slices shown]
[im 1/44]
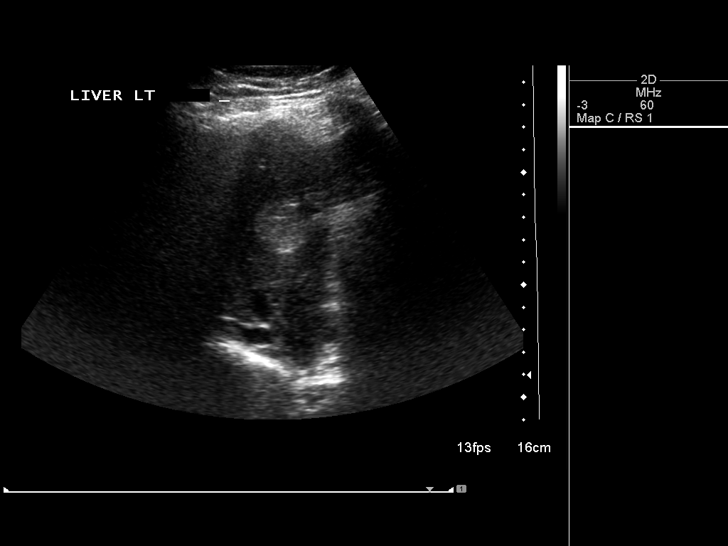
[im 4/44]
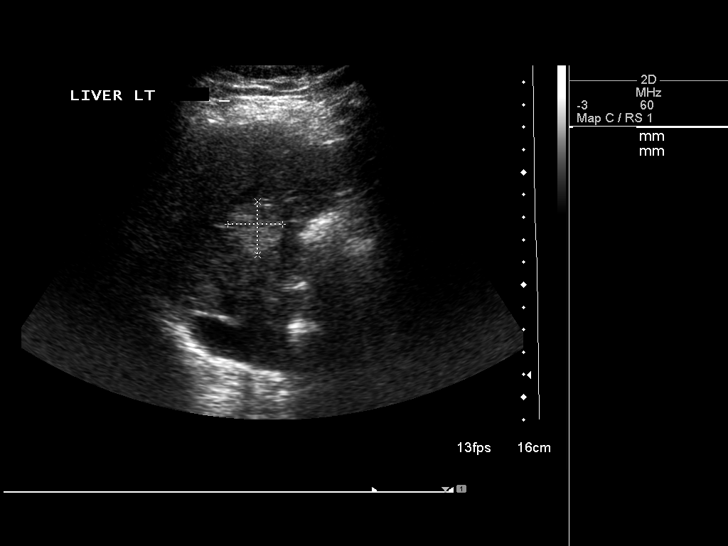
[im 8/44]
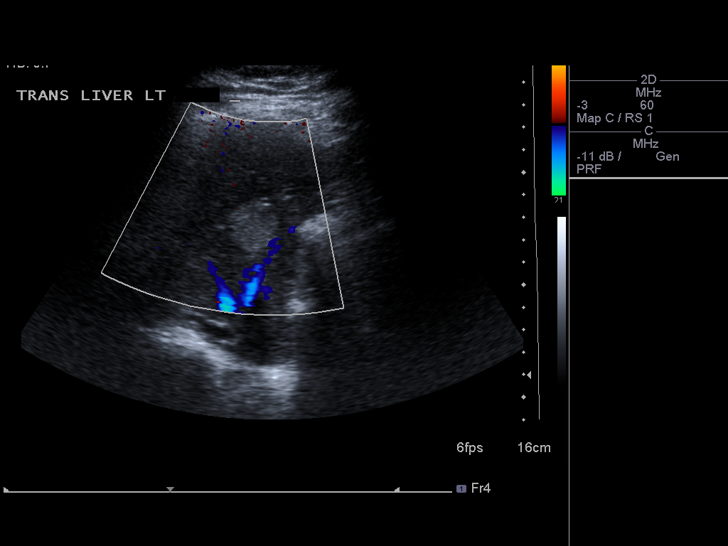
[im 11/44]
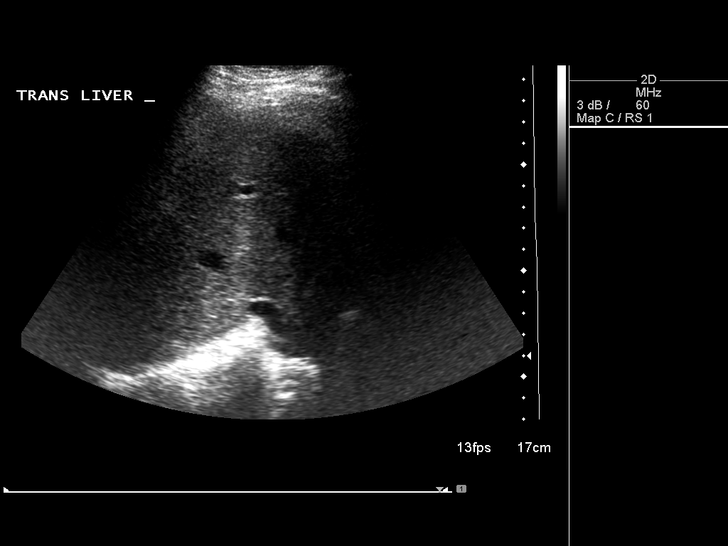
[im 15/44]
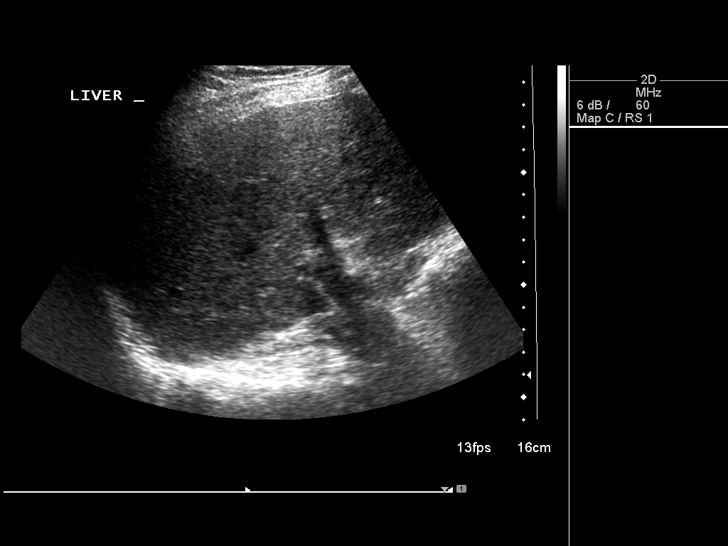
[im 17/44]
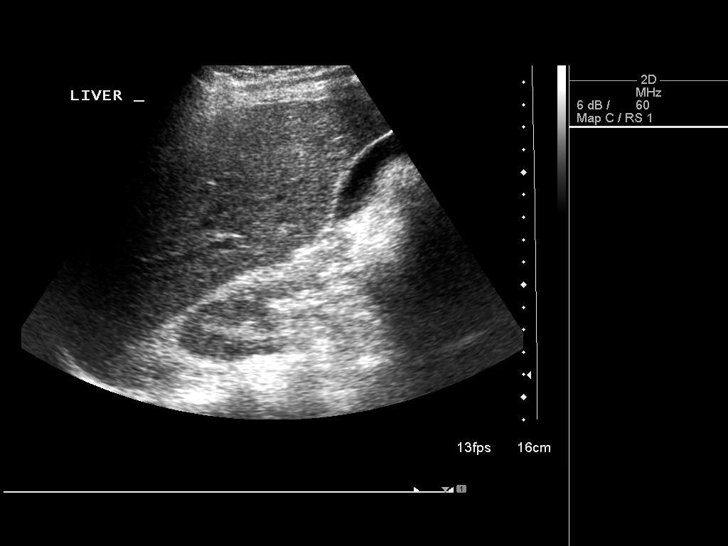
[im 20/44]
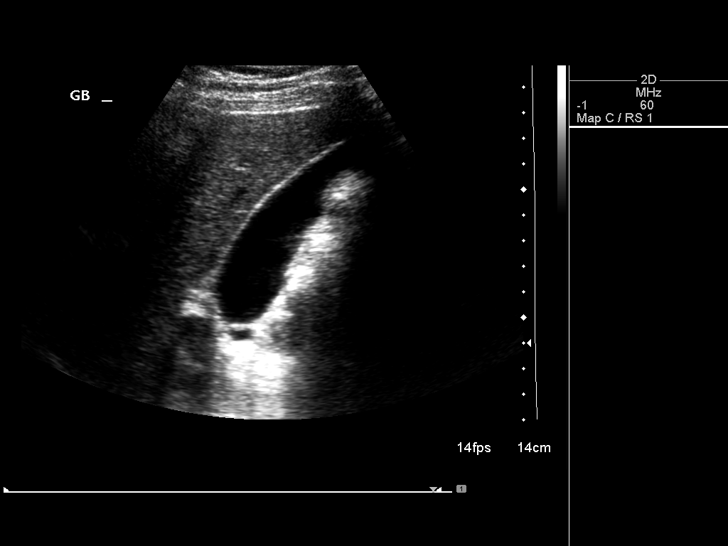
[im 24/44]
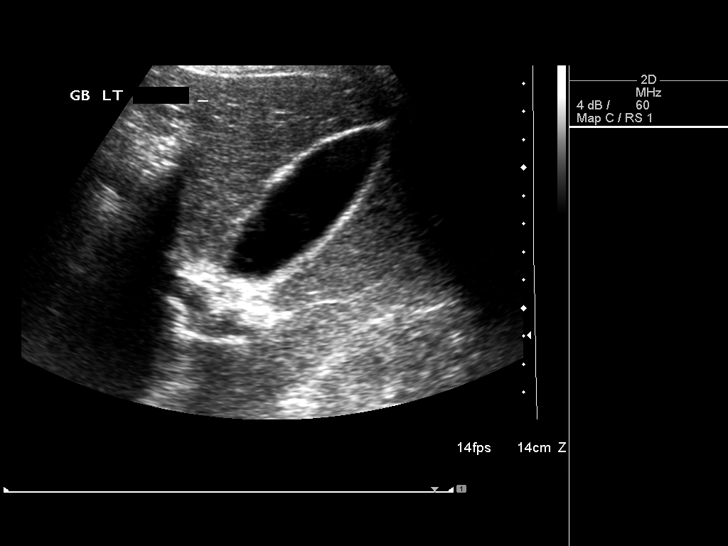
[im 27/44]
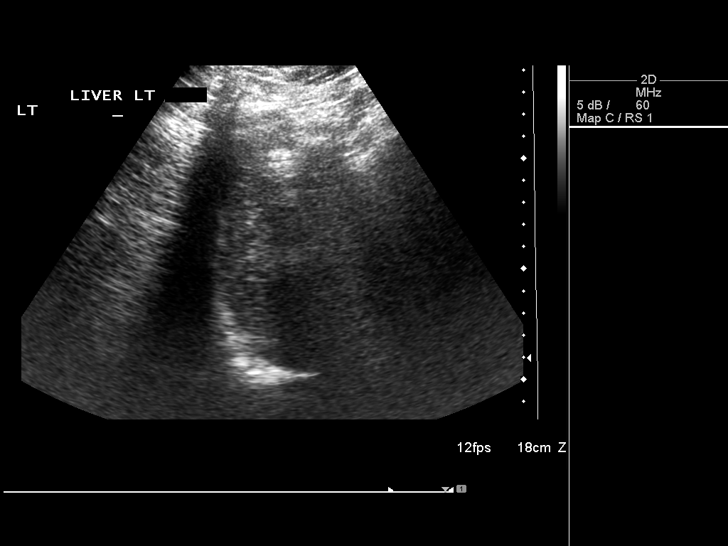
[im 29/44]
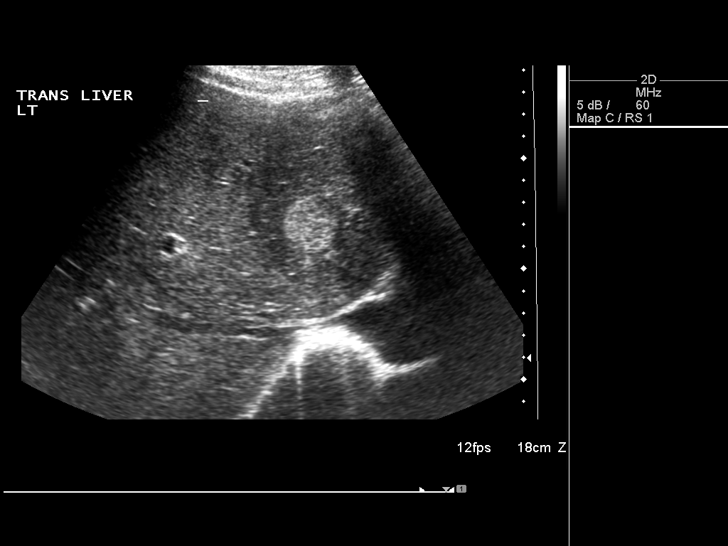
[im 33/44]
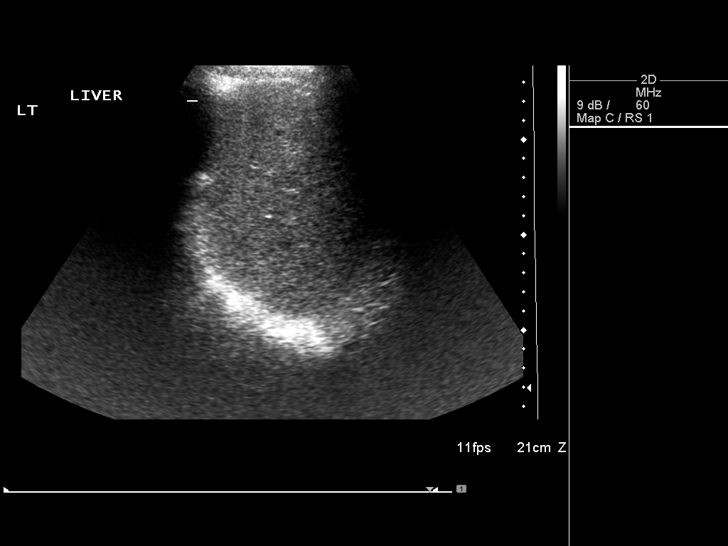
[im 36/44]
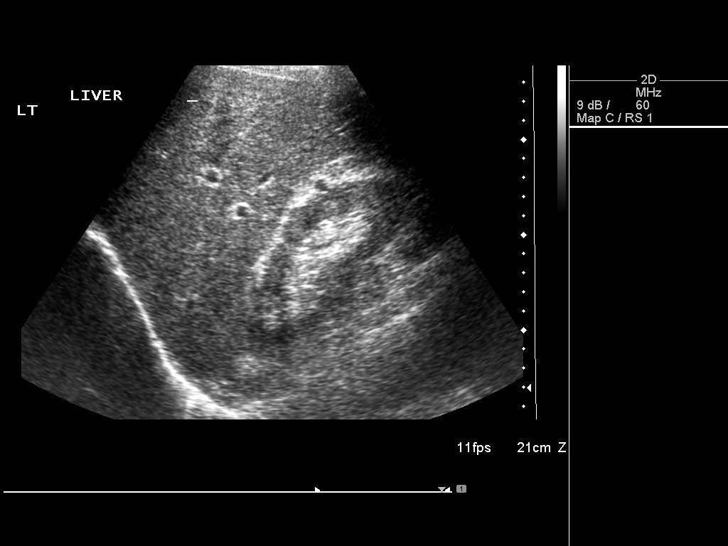
[im 40/44]
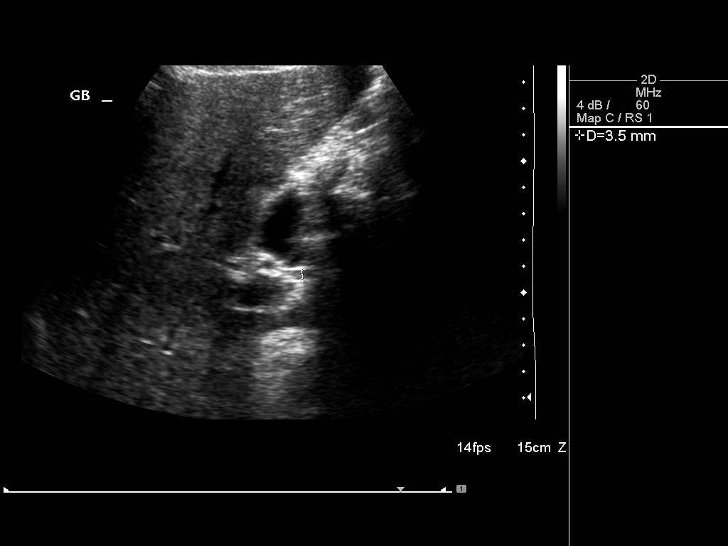
[im 44/44]
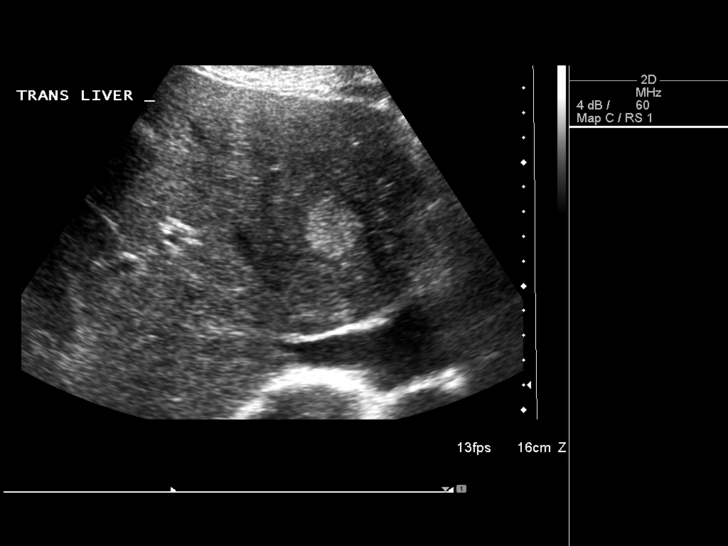

[14 of 25 positions shown; findings below may reference images not displayed]

FINDINGS: Gallbladder:

No gallstones or wall thickening visualized. No sonographic Murphy
sign noted by sonographer.

Common bile duct:

Diameter: 3.8 mm

Liver:

There is a hyperechoic focus in the medial aspect of the left lobe
measuring 2.4 x 2.4 x 2.6 cm. This appears to correspond to the CT
findings. It is not markedly hypervascular. Elsewhere the hepatic
parenchyma is normal in appearance. There is no intrahepatic ductal
dilation.
IMPRESSION: Probable hepatic hemangioma measuring 2.6 cm in greatest dimension.
Six-month follow-up ultrasound examination is recommended to assure
stability.

## 2016-12-11 DIAGNOSIS — L218 Other seborrheic dermatitis: Secondary | ICD-10-CM | POA: Diagnosis not present

## 2016-12-11 DIAGNOSIS — L820 Inflamed seborrheic keratosis: Secondary | ICD-10-CM | POA: Diagnosis not present

## 2016-12-11 DIAGNOSIS — Z85828 Personal history of other malignant neoplasm of skin: Secondary | ICD-10-CM | POA: Diagnosis not present

## 2017-01-13 ENCOUNTER — Other Ambulatory Visit: Payer: Self-pay | Admitting: Internal Medicine

## 2017-01-13 DIAGNOSIS — R911 Solitary pulmonary nodule: Secondary | ICD-10-CM

## 2017-01-16 ENCOUNTER — Other Ambulatory Visit: Payer: Medicare Other

## 2017-01-20 ENCOUNTER — Ambulatory Visit
Admission: RE | Admit: 2017-01-20 | Discharge: 2017-01-20 | Disposition: A | Discharge status: Home / Self Care | Payer: Medicare Other | Source: Ambulatory Visit | Attending: Internal Medicine | Admitting: Internal Medicine

## 2017-01-20 ENCOUNTER — Other Ambulatory Visit: Payer: Medicare Other

## 2017-01-20 DIAGNOSIS — R911 Solitary pulmonary nodule: Secondary | ICD-10-CM | POA: Diagnosis not present

## 2017-01-29 DIAGNOSIS — R3914 Feeling of incomplete bladder emptying: Secondary | ICD-10-CM | POA: Diagnosis not present

## 2017-01-29 DIAGNOSIS — N401 Enlarged prostate with lower urinary tract symptoms: Secondary | ICD-10-CM | POA: Diagnosis not present

## 2017-01-29 DIAGNOSIS — N210 Calculus in bladder: Secondary | ICD-10-CM | POA: Diagnosis not present

## 2017-03-03 DIAGNOSIS — D869 Sarcoidosis, unspecified: Secondary | ICD-10-CM | POA: Diagnosis not present

## 2017-03-03 DIAGNOSIS — I10 Essential (primary) hypertension: Secondary | ICD-10-CM | POA: Diagnosis not present

## 2017-03-03 DIAGNOSIS — E7849 Other hyperlipidemia: Secondary | ICD-10-CM | POA: Diagnosis not present

## 2017-03-10 DIAGNOSIS — Z6830 Body mass index (BMI) 30.0-30.9, adult: Secondary | ICD-10-CM | POA: Diagnosis not present

## 2017-03-10 DIAGNOSIS — E7849 Other hyperlipidemia: Secondary | ICD-10-CM | POA: Diagnosis not present

## 2017-03-10 DIAGNOSIS — D869 Sarcoidosis, unspecified: Secondary | ICD-10-CM | POA: Diagnosis not present

## 2017-03-10 DIAGNOSIS — D1800 Hemangioma unspecified site: Secondary | ICD-10-CM | POA: Diagnosis not present

## 2017-03-10 DIAGNOSIS — R918 Other nonspecific abnormal finding of lung field: Secondary | ICD-10-CM | POA: Diagnosis not present

## 2017-03-10 DIAGNOSIS — K635 Polyp of colon: Secondary | ICD-10-CM | POA: Diagnosis not present

## 2017-03-10 DIAGNOSIS — I10 Essential (primary) hypertension: Secondary | ICD-10-CM | POA: Diagnosis not present

## 2017-03-10 DIAGNOSIS — N400 Enlarged prostate without lower urinary tract symptoms: Secondary | ICD-10-CM | POA: Diagnosis not present

## 2017-03-10 DIAGNOSIS — I700 Atherosclerosis of aorta: Secondary | ICD-10-CM | POA: Diagnosis not present

## 2017-03-10 DIAGNOSIS — Z0000 Encounter for general adult medical examination without abnormal findings: Secondary | ICD-10-CM | POA: Diagnosis not present

## 2017-03-10 DIAGNOSIS — Z1389 Encounter for screening for other disorder: Secondary | ICD-10-CM | POA: Diagnosis not present

## 2017-03-10 DIAGNOSIS — Z87442 Personal history of urinary calculi: Secondary | ICD-10-CM | POA: Diagnosis not present

## 2017-03-19 DIAGNOSIS — L570 Actinic keratosis: Secondary | ICD-10-CM | POA: Diagnosis not present

## 2017-03-19 DIAGNOSIS — L821 Other seborrheic keratosis: Secondary | ICD-10-CM | POA: Diagnosis not present

## 2017-03-19 DIAGNOSIS — Z85828 Personal history of other malignant neoplasm of skin: Secondary | ICD-10-CM | POA: Diagnosis not present

## 2017-03-19 DIAGNOSIS — D1801 Hemangioma of skin and subcutaneous tissue: Secondary | ICD-10-CM | POA: Diagnosis not present

## 2017-04-02 DIAGNOSIS — Z96651 Presence of right artificial knee joint: Secondary | ICD-10-CM | POA: Diagnosis not present

## 2017-04-09 DIAGNOSIS — Z961 Presence of intraocular lens: Secondary | ICD-10-CM | POA: Diagnosis not present

## 2017-05-25 ENCOUNTER — Other Ambulatory Visit: Payer: Self-pay | Admitting: Cardiovascular Disease

## 2017-07-28 ENCOUNTER — Other Ambulatory Visit: Payer: Self-pay | Admitting: Internal Medicine

## 2017-07-28 DIAGNOSIS — R911 Solitary pulmonary nodule: Secondary | ICD-10-CM

## 2017-08-06 ENCOUNTER — Ambulatory Visit (INDEPENDENT_AMBULATORY_CARE_PROVIDER_SITE_OTHER): Payer: Medicare Other | Admitting: Cardiovascular Disease

## 2017-08-06 ENCOUNTER — Encounter: Payer: Self-pay | Admitting: Cardiovascular Disease

## 2017-08-06 VITALS — BP 126/70 | HR 76 | Ht 69.0 in | Wt 193.0 lb

## 2017-08-06 DIAGNOSIS — E7800 Pure hypercholesterolemia, unspecified: Secondary | ICD-10-CM | POA: Diagnosis not present

## 2017-08-06 DIAGNOSIS — I2584 Coronary atherosclerosis due to calcified coronary lesion: Secondary | ICD-10-CM

## 2017-08-06 DIAGNOSIS — R911 Solitary pulmonary nodule: Secondary | ICD-10-CM

## 2017-08-06 DIAGNOSIS — I2510 Atherosclerotic heart disease of native coronary artery without angina pectoris: Secondary | ICD-10-CM

## 2017-08-06 DIAGNOSIS — I350 Nonrheumatic aortic (valve) stenosis: Secondary | ICD-10-CM | POA: Diagnosis not present

## 2017-08-06 DIAGNOSIS — D1803 Hemangioma of intra-abdominal structures: Secondary | ICD-10-CM

## 2017-08-06 NOTE — Progress Notes (Signed)
Patient ID: Kalel Harty, male   DOB: 1932/09/02, 84 y.o.   MRN: [id number]    Primary MD: Dr. Anise Salvo  HPI: Ardith Lewman is a 84 y.o. male who presents to the office today for 46 month cardiology evaluation.  Mr. Kokesh has a remote history of sarcoidosis and had been followed at [facility] with complete remission. He has a history of mild hyperlipidemia and has been on Vytorin 10/40. He also has a history of BPH on Proscar.  An echo Doppler study which was done to evaluate a systolic murmur in the aortic region noted in December 2013 showed normal systolic function with mild focal basal hypertrophy of the septum. He had normal systolic function. There was evidence for a moderate diffuse calcification involving the noncoronary cusp of a trileaflet aortic valve. He did have sclerosis without stenosis. Valve area was 2.55 cm. RV size was upper normal to mildly dilated. A nuclear perfusion study done in 2013 was unchanged from 6 years previously and continued to show normal perfusion.  Laboratory done by Dr. Deforest Hoyles in August 2015 was normal with a hemoglobin of 14.5, hematocrit 43.8.  Renal function was excellent with a BUN of 19, Cr 0.99.  Fasting glucose was 78.  Lipid studies remained excellent with a total cholesterol 125, triglycerides 14, HDL cholesterol 44, and LDL cholesterol at 60.  Angiotensin converting enzyme was normal at 37 and last year was 33.  He underwent an echo Doppler study in 03/13/2015.  This revealed an ejection fraction at 60-65%. There was grade 1 diastolic dysfunction.  He had normal LV filling pressures.  His aortic valve was calcified and there was evidence for mild aortic stenosis with a mean gradient of 9, peak gradient of 18, and valve area of 1.87 cm and mild aortic insufficiency.  There was mild mitral annular calcification with trivial MR , mild LA dilatation, and moderate TR with PA pressure 30 mm. He underwent right knee replacement  surgery by Dr. Moshe Salisbury in November.  Postoperatively had some swelling and lower extremity venous studies were negative.  Since I last saw him one year ago, he has done well from a cardiac standpoint.  Specifically, he denies any episodes of chest pain.  He is now able to play tennis again following his knee surgery.  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  He had also undergone follow-up CT imaging asked and was found to have a new irregularly-shaped nodular density in the left lower lobe, alt to represent a possible focus of active infection/inflammation.  A follow-up CT was recommended.  After a course of antimicrobial therapy.  His CT also demonstrated a stable ectatic.  Borderline aneurysmal ascending thoracic aorta at 3.9 cm.  He had evidence for coronary calcification.   Laboratory in 2017 by his primary physician showed total cholesterol was 134, triglycerides 79, HDL 43, and LDL 75 on his current dose of ezetimibe 10 mg and simvastatin 40 mg.   With a past year, but already has continued to be active.  He walks on the elliptical machine at least 3-4 times per week and plays tennis 2 times per week.  He denies any chest pain or shortness of breath.  He denies palpitations.  He presents for one-year follow-up evaluation.    Past Medical History:  Diagnosis Date  .  Bladder stones   . BPH (benign prostatic hyperplasia)   . Hernia of abdominal cavity   . History of kidney stones   . Hyperlipidemia   . Incomplete right bundle branch block   . Low O2 saturation 04/04/2015   Wife says O2 sats are in the low 90s base line.  Now in the 96s.  Will do incentive spirometry  . Primary localized osteoarthritis of right knee 04/02/2015  . Sarcoidosis    diagnosed 8-9 yrs ago  . Upper GI bleed 04/04/2015   Patient had coffee ground emesis that was  Guiac positive on Monday.  EGD scheduled for 04/04/2015. Started on IV Protonix    Past Surgical History:  Procedure Laterality Date  . ANTERIOR CERVICAL DECOMP/DISCECTOMY FUSION  2007  . BRONCHOSCOPY  2007  . CARDIOVASCULAR STRESS TEST  04/29/2012   normal nuclear stress study.   . CYSTOSCOPY  2004  . DOPPLER ECHOCARDIOGRAPHY  04/29/2012   EF not noted. small perimembranous ventricular septal defect. small left to right ventricular shunt. moderate diffuse calcification involving noncoronary cusp of the aortic valve.   . ESOPHAGOGASTRODUODENOSCOPY  04/04/2015  . ESOPHAGOGASTRODUODENOSCOPY N/A 04/04/2015   Procedure: ESOPHAGOGASTRODUODENOSCOPY (EGD);  Surgeon: Manus Gunning, MD;  Location: [place];  Service: Gastroenterology;  Laterality: N/A;  . INGUINAL HERNIA REPAIR Right 2009  . TOTAL KNEE ARTHROPLASTY Right 04/02/2015   Procedure: TOTAL KNEE ARTHROPLASTY;  Surgeon: Elsie Saas, MD;  Location: [place];  Service: Orthopedics;  Laterality: Right;    No Known Allergies  Current Outpatient Medications  Medication Sig Dispense Refill  . ezetimibe (ZETIA) 10 MG tablet TAKE (1) TABLET DAILY AT BEDTIME. 90 tablet 3  . finasteride (PROSCAR) 5 MG tablet Take 5 mg by mouth every evening.     . potassium citrate (UROCIT-K) 10 MEQ (1080 MG) SR tablet Take 10 mEq by mouth 3 (three) times daily with meals.    . simvastatin (ZOCOR) 40 MG tablet TAKE (1) TABLET DAILY AT BEDTIME. 90 tablet 1   No current facility-administered medications for this visit.     Social History   Socioeconomic History  . Marital status: Married    Spouse name: Not on file  . Number of children: Not on file  . Years of education: Not on file  . Highest education level: Not on file  Social Needs  . Financial resource strain: Not on file  . Food insecurity - worry: Not on file  . Food insecurity - inability: Not on file  . Transportation needs - medical: Not on file  . Transportation needs -  non-medical: Not on file  Occupational History  . Not on file  Tobacco Use  . Smoking status: Former Smoker    Types: Pipe    Last attempt to quit: 05/26/1972    Years since quitting: 45.2  . Smokeless tobacco: Never Used  . Tobacco comment: Quit 43 year  Substance and Sexual Activity  . Alcohol use: Yes    Alcohol/week: 2.4 oz    Types: 4 Standard drinks or equivalent per week  . Drug use: No  . Sexual activity: Not on file  Other Topics Concern  . Not on file  Social History Narrative  . Not on file   Socially he is married. He is regional and 9 grandchildren. One son lives in [place], or lives in [place]. There is no tobacco use. He does take occasional alcohol. He does exercise.  Family History  Problem Relation Age of Onset  . Ovarian cancer  Mother   . Heart attack Father 100    ROS General: Negative; No fevers, chills, or night sweats;  HEENT: Negative; No changes in vision or hearing, sinus congestion, difficulty swallowing Pulmonary: Negative; No cough, wheezing, shortness of breath, hemoptysis Cardiovascular: Negative; No chest pain, presyncope, syncope, palpitations GI: Negative; No nausea, vomiting, diarrhea, or abdominal pain GU: Negative; No dysuria, hematuria, or difficulty voiding Musculoskeletal:  He has significant discomfort in his right the following his knee replacement surgery. Hematologic/Oncology: Negative; no easy bruising, bleeding Remote history of sarcoidosis, completely resolved Endocrine: Negative; no heat/cold intolerance; no diabetes Neuro: Negative; no changes in balance, headaches Skin: Negative; No rashes or skin lesions Psychiatric: Negative; No behavioral problems, depression Sleep: Negative; No snoring, daytime sleepiness, hypersomnolence, bruxism, restless legs, hypnogognic hallucinations, no cataplexy Other comprehensive 14 point system review is negative.   PE BP 126/70   Pulse 76   Ht _0  (1.753 m)   Wt 193 lb (87.5 kg)   BMI  28.50 kg/m    Repeat blood pressure by me was 122/70  Wt Readings from Last 3 Encounters:  08/06/17 193 lb (87.5 kg)  06/26/16 192 lb (87.1 kg)  05/08/16 191 lb (86.6 kg)   General: Alert, oriented, no distress.  Skin: normal turgor, no rashes, warm and dry HEENT: Normocephalic, atraumatic. Pupils equal round and reactive to light; sclera anicteric; extraocular muscles intact;  Nose without nasal septal hypertrophy Mouth/Parynx benign; Mallinpatti scale 2 Neck: No JVD, no carotid bruits; normal carotid upstroke Lungs: clear to ausculatation and percussion; no wheezing or rales Chest wall: without tenderness to palpitation Heart: PMI not displaced, RRR, s1 s2 normal, 2/6 systolic murmur, no diastolic murmur, no rubs, gallops, thrills, or heaves Abdomen: soft, nontender; no hepatosplenomehaly, BS+; abdominal aorta nontender and not dilated by palpation. Back: no CVA tenderness Pulses 2+ Musculoskeletal: full range of motion, normal strength, no joint deformities Extremities: no clubbing cyanosis or edema, Homan's sign negative  Neurologic: grossly nonfocal; Cranial nerves grossly wnl Psychologic: Normal mood and affect   ECG (independently read by me): normal sinus rhythm at 65 bpm.  One isolated PVC.  Incomplete right bundle branch block.  Borderline LVH.  Normal intervals.  February 2018 ECG (independently read by me): Sinus bradycardia at 56 bpm.  Mild sinus arrhythmia.  Mild LVH.  Normal intervals.  No ST segment changes.  February 2017 ECG (independently read by me):  Normal sinus rhythm at 63 bpm.  Early transition.  No ST segment changes.  December 2015 ECG (independently read by me): Normal sinus rhythm at 66 bpm.  Mild RV conduction delay.  Early transition.  December 2014 ECG: Sinus rhythm with an occasional PVC; normal intervals.  LABS: I personally reviewed the laboratory from [place] done on 02/19/2016.  BUN 18, creatinine 0.9.  Ingrown 14.7,  hematocrit 44.9.  Normal LFTs.  TSH 2.08.  April lipoprotein B 66.  Normal PSA.  Lipid studies as noted above.  Most recent lipid studies from March 03, 2017: Total cholesterol 134, HDL 39, LDL 70, triglycerides 124.    BMP Latest Ref Rng & Units 04/06/2015 04/05/2015 04/04/2015  Glucose 65 - 99 mg/dL 114(H) 103(H) 110(H)  BUN 6 - 20 mg/dL _1 Creatinine 0.61 - 1.24 mg/dL 0.95 1.02 0.90  Sodium 135 - 145 mmol/L 136 132(L) 136  Potassium 3.5 - 5.1 mmol/L 4.4 4.1 4.8  Chloride 101 - 111 mmol/L 95(L) 97(L) 103  CO2 22 - 32 mmol/L _2 Calcium  8.9 - 10.3 mg/dL 9.1 8.9 9.2   Hepatic Function Latest Ref Rng & Units 03/20/2015 08/21/2014  Total Protein 6.5 - 8.1 g/dL 6.6 7.0  Albumin 3.5 - 5.0 g/dL 4.0 4.1  AST 15 - 41 U/L 29 32  ALT 17 - 63 U/L 30 32  Alk Phosphatase 38 - 126 U/L 68 73  Total Bilirubin 0.3 - 1.2 mg/dL 0.8 0.7   CBC Latest Ref Rng & Units 04/06/2015 04/05/2015 04/04/2015  WBC 4.0 - 10.5 K/uL 11.3(H) 10.8(H) 16.5(H)  Hemoglobin 13.0 - 17.0 g/dL 8.9(L) 8.7(L) 9.4(L)  Hematocrit 39.0 - 52.0 % 27.1(L) 26.5(L) 28.8(L)  Platelets 150 - 400 K/uL 152 131(L) 142(L)   Lab Results  Component Value Date   MCV 91.2 04/06/2015   MCV 92.7 04/05/2015   MCV 91.7 04/04/2015   RADIOLOGY: No results found.  IMPRESSION:  1. Pure hypercholesterolemia   2. Aortic valve stenosis, mild   3. Coronary artery calcification     ASSESSMENT AND PLAN: Mr. Mcglinn is an 84 year old gentleman who has a remote history of prior sarcoid disease with ultimate complete remisson.  He has a history of mild hyperlipidemia whichhas been aggressively controlled.  He has tolerated zetia/simvastatin 10/40.  I reviewed his most recent lipid studies which were done by his primary physician.  LDL is 70, with a total cholesterol 134, glycerides 124, and HDL 39. He was found to have mild coronary calcification on CT imaging.  His aortic root is upper normal in size at 3.9 cm mild ectasia.  I reviewed his  chest CT as well as his MRI of his abdomen.  His blood pressure today is stable at 126/70.  His BMI is consistent with mildly overweight.  He continues to exercise regularly at least 5-6 days/week and is asymptomatic.  ECG remained stable.  We discussed the recent studies with reference to aspirin prophylactically and benefit ratio regarding complete risk.  He has previously documented mild aortic valve stenosis and his last echo in October 2016 showed slight progression of his aortic valve disease with a peak gradient of 18 and mean gradient of 9 mmHg consistent with mild aortic valve stenosis and he also had mild aortic insufficiency  He has mild LVH with grade 1 diastolic dysfunction and normal LV filling pressures.  Continue with his current medical regimen.  As he remains stable I will see him in 1 year for reevaluation or sooner if problems develop.  Troy Sine, MD, [facility]  08/07/2017 1:22 PM

## 2017-08-06 NOTE — Patient Instructions (Signed)

## 2017-08-07 ENCOUNTER — Encounter: Payer: Self-pay | Admitting: Cardiovascular Disease

## 2017-08-12 ENCOUNTER — Ambulatory Visit
Admission: RE | Admit: 2017-08-12 | Discharge: 2017-08-12 | Disposition: A | Discharge status: Home / Self Care | Payer: Medicare Other | Source: Ambulatory Visit | Attending: Internal Medicine | Admitting: Internal Medicine

## 2017-08-12 DIAGNOSIS — R911 Solitary pulmonary nodule: Secondary | ICD-10-CM

## 2017-08-26 DIAGNOSIS — R05 Cough: Secondary | ICD-10-CM | POA: Diagnosis not present

## 2017-08-26 DIAGNOSIS — R918 Other nonspecific abnormal finding of lung field: Secondary | ICD-10-CM | POA: Diagnosis not present

## 2017-08-26 DIAGNOSIS — Z6830 Body mass index (BMI) 30.0-30.9, adult: Secondary | ICD-10-CM | POA: Diagnosis not present

## 2017-08-26 DIAGNOSIS — D869 Sarcoidosis, unspecified: Secondary | ICD-10-CM | POA: Diagnosis not present

## 2017-08-26 DIAGNOSIS — J019 Acute sinusitis, unspecified: Secondary | ICD-10-CM | POA: Diagnosis not present

## 2017-08-26 DIAGNOSIS — I10 Essential (primary) hypertension: Secondary | ICD-10-CM | POA: Diagnosis not present

## 2017-08-26 DIAGNOSIS — J209 Acute bronchitis, unspecified: Secondary | ICD-10-CM | POA: Diagnosis not present

## 2017-08-27 ENCOUNTER — Other Ambulatory Visit (INDEPENDENT_AMBULATORY_CARE_PROVIDER_SITE_OTHER): Payer: Medicare Other

## 2017-08-27 ENCOUNTER — Ambulatory Visit (INDEPENDENT_AMBULATORY_CARE_PROVIDER_SITE_OTHER): Payer: Medicare Other | Admitting: Pulmonary Disease

## 2017-08-27 ENCOUNTER — Encounter: Payer: Self-pay | Admitting: Pulmonary Disease

## 2017-08-27 ENCOUNTER — Telehealth: Payer: Self-pay | Admitting: Pulmonary Disease

## 2017-08-27 VITALS — BP 118/72 | HR 64 | Ht 69.0 in | Wt 191.2 lb

## 2017-08-27 DIAGNOSIS — I2510 Atherosclerotic heart disease of native coronary artery without angina pectoris: Secondary | ICD-10-CM | POA: Diagnosis not present

## 2017-08-27 DIAGNOSIS — I2584 Coronary atherosclerosis due to calcified coronary lesion: Secondary | ICD-10-CM | POA: Diagnosis not present

## 2017-08-27 DIAGNOSIS — D869 Sarcoidosis, unspecified: Secondary | ICD-10-CM

## 2017-08-27 LAB — BASIC METABOLIC PANEL
BUN: 21 mg/dL (ref 6–23)
CALCIUM: 10.1 mg/dL (ref 8.4–10.5)
CO2: 26 meq/L (ref 19–32)
Chloride: 103 mEq/L (ref 96–112)
Creatinine, Ser: 0.96 mg/dL (ref 0.40–1.50)
GFR: 79.5 mL/min (ref >60.00)
GLUCOSE: 126 mg/dL — AB (ref 70–99)
Potassium: 4.3 mEq/L (ref 3.5–5.1)
Sodium: 137 mEq/L (ref 135–145)

## 2017-08-27 NOTE — Assessment & Plan Note (Signed)
He has progressive infiltrate in the left lung.  Differential diagnosis includes sarcoidosis based on prior granulomatous inflammation on previous transbuccal biopsy versus aspiration pneumonia. He does not have any obvious risk factors for aspiration or obvious history of dysphagia. He does not appear to be symptomatic at the current time. I offered empiric treatment with prednisone followed by repeat imaging as an alternative strategy.  He would prefer definitive diagnosis with bronchoscopy with biopsy and then treatment is required.  We will obtain ACE level and serum calcium level for baseline and repeat PFTs and compared with earlier studies. Bronchoscopy was scheduled for in 3 weeks time based on patient's preference.  Risks and benefits of procedure including that of bleeding and lung puncture requiring chest tube were discussed in detail

## 2017-08-27 NOTE — Telephone Encounter (Signed)
Called and spoke with patient regarding bronch on Sep 23 2017 Pt is wanting to know details for bronch on what is needed prior too Advised pt that someone will call him several days prior to [place] on 09/23/17 Pt requesting someone call him on 09/20/2017 with details  Routing message to [place] for review.

## 2017-08-27 NOTE — Progress Notes (Signed)
Subjective:    Patient ID: Eugene [name], male    DOB: [birth year], 84 y.o.   MRN: [id number]  HPI  84 year old man with remote history of sarcoidosis, presents for evaluation of abnormal CT imaging showing pulmonary infiltrate. 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  He was evaluated by my partner many years ago and underwent transbronchial biopsy 08/2005 which showed granulomas and he was diagnosed with sarcoidosis.  He then took a second opinion at the sarcoidosis clinic in [place] and initially was treated with prednisone and then some other medication for a period of time . He has been in good health since then, underwent knee replacement in 04/2015 and postop course was complicated by right upper lobe aspiration pneumonia that improved with antibiotics  CT chest 04/2015 showed improved aeration in the lungs compared to previous pneumonia and chronic interstitial density in the left lower lung  CT chest 05/2016 showed new nodular density in the left lower lobe 1 cm with groundglass attenuation around it  CT chest 06/2016 showed that this nodule had resolved but a new groundglass opacity 2 cm had developed in the lingula  CT chest 12/2016 showed that this groundglass nodule was not well appreciated.  CT chest 07/2017 showed progressive groundglass opacity in the left upper lobe    He denies shortness of breath or wheezing or hemoptysis. He denies fevers or weight loss  Significant tests/ events reviewed  PFTs 04/2016 showed ratio of 84, FEV1 of 98%, FVC of 82%, DLCO 64% and DLCO of 74% suggestive of mild intraparenchymal restriction    Past Medical History:  Diagnosis Date  . Bladder stones   . BPH (benign prostatic hyperplasia)   . Hernia of abdominal cavity   . History of kidney stones   . Hyperlipidemia   . Incomplete right bundle branch block   . Low O2 saturation 04/04/2015   Wife says O2 sats are in the low 90s base line.  Now in the 39s.  Will do  incentive spirometry  . Primary localized osteoarthritis of right knee 04/02/2015  . Sarcoidosis    diagnosed 8-9 yrs ago  . Upper GI bleed 04/04/2015   Patient had coffee ground emesis that was Guiac positive on Monday.  EGD scheduled for 04/04/2015. Started on IV Protonix     Past Surgical History:  Procedure Laterality Date  . ANTERIOR CERVICAL DECOMP/DISCECTOMY FUSION  2007  . BRONCHOSCOPY  2007  . CARDIOVASCULAR STRESS TEST  04/29/2012   normal nuclear stress study.   . CYSTOSCOPY  2004  . DOPPLER ECHOCARDIOGRAPHY  04/29/2012   EF not noted. small perimembranous ventricular septal defect. small left to right ventricular shunt. moderate diffuse calcification involving noncoronary cusp of the aortic valve.   . ESOPHAGOGASTRODUODENOSCOPY  04/04/2015  . ESOPHAGOGASTRODUODENOSCOPY N/A 04/04/2015   Procedure: ESOPHAGOGASTRODUODENOSCOPY (EGD);  Surgeon: Manus Gunning, MD;  Location: [place];  Service: Gastroenterology;  Laterality: N/A;  . INGUINAL HERNIA REPAIR Right 2009  . TOTAL KNEE ARTHROPLASTY Right 04/02/2015   Procedure: TOTAL KNEE ARTHROPLASTY;  Surgeon: Elsie Saas, MD;  Location: [place];  Service: Orthopedics;  Laterality: Right;    No Known Allergies  Social History   Socioeconomic History  . Marital status: Married    Spouse name: Not on file  . Number of children: Not on file  . Years of education: Not on file  . Highest education level: Not on file  Occupational History  . Not on file  Social  Needs  . Financial resource strain: Not on file  . Food insecurity:    Worry: Not on file    Inability: Not on file  . Transportation needs:    Medical: Not on file    Non-medical: Not on file  Tobacco Use  . Smoking status: Former Smoker    Types: Pipe    Last attempt to quit: 05/26/1972    Years since quitting: 45.2  . Smokeless tobacco: Never Used  . Tobacco comment: Quit 45 year  Substance and Sexual Activity  . Alcohol use: Yes    Alcohol/week: 2.4  oz    Types: 4 Standard drinks or equivalent per week  . Drug use: No  . Sexual activity: Not on file  Lifestyle  . Physical activity:    Days per week: Not on file    Minutes per session: Not on file  . Stress: Not on file  Relationships  . Social connections:    Talks on phone: Not on file    Gets together: Not on file    Attends religious service: Not on file    Active member of club or organization: Not on file    Attends meetings of clubs or organizations: Not on file    Relationship status: Not on file  . Intimate partner violence:    Fear of current or ex partner: Not on file    Emotionally abused: Not on file    Physically abused: Not on file    Forced sexual activity: Not on file  Other Topics Concern  . Not on file  Social History Narrative  . Not on file      Family History  Problem Relation Age of Onset  . Ovarian cancer Mother   . Heart attack Father 100       Review of Systems Constitutional: negative for anorexia, fevers and sweats  Eyes: negative for irritation, redness and visual disturbance  Ears, nose, mouth, throat, and face: negative for earaches, epistaxis, nasal congestion and sore throat  Respiratory: negative for cough, dyspnea on exertion, sputum and wheezing  Cardiovascular: negative for chest pain, dyspnea, lower extremity edema, orthopnea, palpitations and syncope  Gastrointestinal: negative for abdominal pain, constipation, diarrhea, melena, nausea and vomiting  Genitourinary:negative for dysuria, frequency and hematuria  Hematologic/lymphatic: negative for bleeding, easy bruising and lymphadenopathy  Musculoskeletal:negative for arthralgias, muscle weakness and stiff joints  Neurological: negative for coordination problems, gait problems, headaches and weakness  Endocrine: negative for diabetic symptoms including polydipsia, polyuria and weight loss      Objective:   Physical Exam  Gen. Pleasant, obese, in no distress, normal  affect ENT - no lesions, no post nasal drip, class 2-3 airway Neck: No JVD, no thyromegaly, no carotid bruits Lungs: no use of accessory muscles, no dullness to percussion, decreased without rales or rhonchi  Cardiovascular: Rhythm regular, heart sounds  normal, no murmurs or gallops, no peripheral edema Abdomen: soft and non-tender, no hepatosplenomegaly, BS normal. Musculoskeletal: No deformities, no cyanosis or clubbing Neuro:  alert, non focal, no tremors        Assessment & Plan:

## 2017-08-27 NOTE — Patient Instructions (Signed)
We discussed CT scan findings of hazy opacity in the left lung. We discussed possibilities including sarcoidosis.  Bronchoscopy with biopsy will be scheduled for May 1 at 8 AM  -we discussed risks and benefits  Schedule PFTs. Blood work today

## 2017-08-27 NOTE — H&P (View-Only) (Signed)
Subjective:    Patient ID: Eugene [name], male    DOB: 08/28/1932, 84 y.o.   MRN: [id number]  HPI  84 year old man with remote history of sarcoidosis, presents for evaluation of abnormal CT imaging showing pulmonary infiltrate. 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  He was evaluated by my partner many years ago and underwent transbronchial biopsy 08/2005 which showed granulomas and he was diagnosed with sarcoidosis.  He then took a second opinion at the sarcoidosis clinic in [place] and initially was treated with prednisone and then some other medication for a period of time . He has been in good health since then, underwent knee replacement in 04/2015 and postop course was complicated by right upper lobe aspiration pneumonia that improved with antibiotics  CT chest 04/2015 showed improved aeration in the lungs compared to previous pneumonia and chronic interstitial density in the left lower lung  CT chest 05/2016 showed new nodular density in the left lower lobe 1 cm with groundglass attenuation around it  CT chest 06/2016 showed that this nodule had resolved but a new groundglass opacity 2 cm had developed in the lingula  CT chest 12/2016 showed that this groundglass nodule was not well appreciated.  CT chest 07/2017 showed progressive groundglass opacity in the left upper lobe    He denies shortness of breath or wheezing or hemoptysis. He denies fevers or weight loss  Significant tests/ events reviewed  PFTs 04/2016 showed ratio of 84, FEV1 of 98%, FVC of 82%, DLCO 64% and DLCO of 74% suggestive of mild intraparenchymal restriction    Past Medical History:  Diagnosis Date  . Bladder stones   . BPH (benign prostatic hyperplasia)   . Hernia of abdominal cavity   . History of kidney stones   . Hyperlipidemia   . Incomplete right bundle branch block   . Low O2 saturation 04/04/2015   Wife says O2 sats are in the low 90s base line.  Now in the 76s.  Will do  incentive spirometry  . Primary localized osteoarthritis of right knee 04/02/2015  . Sarcoidosis    diagnosed 8-9 yrs ago  . Upper GI bleed 04/04/2015   Patient had coffee ground emesis that was Guiac positive on Monday.  EGD scheduled for 04/04/2015. Started on IV Protonix     Past Surgical History:  Procedure Laterality Date  . ANTERIOR CERVICAL DECOMP/DISCECTOMY FUSION  2007  . BRONCHOSCOPY  2007  . CARDIOVASCULAR STRESS TEST  04/29/2012   normal nuclear stress study.   . CYSTOSCOPY  2004  . DOPPLER ECHOCARDIOGRAPHY  04/29/2012   EF not noted. small perimembranous ventricular septal defect. small left to right ventricular shunt. moderate diffuse calcification involving noncoronary cusp of the aortic valve.   . ESOPHAGOGASTRODUODENOSCOPY  04/04/2015  . ESOPHAGOGASTRODUODENOSCOPY N/A 04/04/2015   Procedure: ESOPHAGOGASTRODUODENOSCOPY (EGD);  Surgeon: Manus Gunning, MD;  Location: [facility];  Service: Gastroenterology;  Laterality: N/A;  . INGUINAL HERNIA REPAIR Right 2009  . TOTAL KNEE ARTHROPLASTY Right 04/02/2015   Procedure: TOTAL KNEE ARTHROPLASTY;  Surgeon: Elsie Saas, MD;  Location: [facility];  Service: Orthopedics;  Laterality: Right;    No Known Allergies  Social History   Socioeconomic History  . Marital status: Married    Spouse name: Not on file  . Number of children: Not on file  . Years of education: Not on file  . Highest education level: Not on file  Occupational History  . Not on file  Social  Needs  . Financial resource strain: Not on file  . Food insecurity:    Worry: Not on file    Inability: Not on file  . Transportation needs:    Medical: Not on file    Non-medical: Not on file  Tobacco Use  . Smoking status: Former Smoker    Types: Pipe    Last attempt to quit: 05/26/1972    Years since quitting: 45.2  . Smokeless tobacco: Never Used  . Tobacco comment: Quit 43 year  Substance and Sexual Activity  . Alcohol use: Yes    Alcohol/week: 2.4  oz    Types: 4 Standard drinks or equivalent per week  . Drug use: No  . Sexual activity: Not on file  Lifestyle  . Physical activity:    Days per week: Not on file    Minutes per session: Not on file  . Stress: Not on file  Relationships  . Social connections:    Talks on phone: Not on file    Gets together: Not on file    Attends religious service: Not on file    Active member of club or organization: Not on file    Attends meetings of clubs or organizations: Not on file    Relationship status: Not on file  . Intimate partner violence:    Fear of current or ex partner: Not on file    Emotionally abused: Not on file    Physically abused: Not on file    Forced sexual activity: Not on file  Other Topics Concern  . Not on file  Social History Narrative  . Not on file      Family History  Problem Relation Age of Onset  . Ovarian cancer Mother   . Heart attack Father 100       Review of Systems Constitutional: negative for anorexia, fevers and sweats  Eyes: negative for irritation, redness and visual disturbance  Ears, nose, mouth, throat, and face: negative for earaches, epistaxis, nasal congestion and sore throat  Respiratory: negative for cough, dyspnea on exertion, sputum and wheezing  Cardiovascular: negative for chest pain, dyspnea, lower extremity edema, orthopnea, palpitations and syncope  Gastrointestinal: negative for abdominal pain, constipation, diarrhea, melena, nausea and vomiting  Genitourinary:negative for dysuria, frequency and hematuria  Hematologic/lymphatic: negative for bleeding, easy bruising and lymphadenopathy  Musculoskeletal:negative for arthralgias, muscle weakness and stiff joints  Neurological: negative for coordination problems, gait problems, headaches and weakness  Endocrine: negative for diabetic symptoms including polydipsia, polyuria and weight loss      Objective:   Physical Exam  Gen. Pleasant, obese, in no distress, normal  affect ENT - no lesions, no post nasal drip, class 2-3 airway Neck: No JVD, no thyromegaly, no carotid bruits Lungs: no use of accessory muscles, no dullness to percussion, decreased without rales or rhonchi  Cardiovascular: Rhythm regular, heart sounds  normal, no murmurs or gallops, no peripheral edema Abdomen: soft and non-tender, no hepatosplenomegaly, BS normal. Musculoskeletal: No deformities, no cyanosis or clubbing Neuro:  alert, non focal, no tremors        Assessment & Plan:

## 2017-08-27 NOTE — Telephone Encounter (Signed)
Patient was advised that someone from respiratory will call him a few days prior to his appt.   Nothing else needed.

## 2017-08-28 LAB — ANGIOTENSIN CONVERTING ENZYME: Angiotensin-Converting Enzyme: 28 U/L (ref 9–67)

## 2017-09-17 ENCOUNTER — Ambulatory Visit (INDEPENDENT_AMBULATORY_CARE_PROVIDER_SITE_OTHER): Payer: Medicare Other | Admitting: Pulmonary Disease

## 2017-09-17 DIAGNOSIS — D869 Sarcoidosis, unspecified: Secondary | ICD-10-CM | POA: Diagnosis not present

## 2017-09-17 LAB — PULMONARY FUNCTION TEST
DL/VA % PRED: 95 %
DL/VA: 4.24 ml/min/mmHg/L
DLCO UNC % PRED: 74 %
DLCO unc: 22.09 ml/min/mmHg
FEF 25-75 Post: 3.18 L/sec
FEF 25-75 Pre: 2.6 L/sec
FEF2575-%Change-Post: 22 %
FEF2575-%Pred-Post: 193 %
FEF2575-%Pred-Pre: 158 %
FEV1-%Change-Post: 3 %
FEV1-%Pred-Post: 92 %
FEV1-%Pred-Pre: 89 %
FEV1-Post: 2.31 L
FEV1-Pre: 2.23 L
FEV1FVC-%Change-Post: 0 %
FEV1FVC-%Pred-Pre: 119 %
FEV6-%Change-Post: 4 %
FEV6-%PRED-PRE: 79 %
FEV6-%Pred-Post: 83 %
FEV6-POST: 2.76 L
FEV6-PRE: 2.63 L
FEV6FVC-%PRED-POST: 107 %
FEV6FVC-%PRED-PRE: 107 %
FVC-%Change-Post: 4 %
FVC-%PRED-POST: 77 %
FVC-%PRED-PRE: 73 %
FVC-POST: 2.76 L
FVC-PRE: 2.63 L
POST FEV6/FVC RATIO: 100 %
Post FEV1/FVC ratio: 84 %
Pre FEV1/FVC ratio: 85 %
Pre FEV6/FVC Ratio: 100 %

## 2017-09-17 NOTE — Progress Notes (Signed)
Patient had PFT 09/17/17   We were able to complete parts of the spirometry, and DLCO. Patient refused the pleth. Albuterol was given and waited 15 minutes to do the after.

## 2017-09-18 ENCOUNTER — Telehealth: Payer: Self-pay | Admitting: Pulmonary Disease

## 2017-09-18 NOTE — Telephone Encounter (Signed)
Called spoke with patient's spouse Eugene [name] who reported that patient received a call yesterday to review pt's meds for the bronchoscopy but the person was unable to answer any of his questions regarding where to check in at, amount of sedation, etc.  Eugene [name] upset with the lack of information  Eugene [name] that the hospital always calls the day prior with all of the necessary information but will gladly call and investigate to provide the information she needs  Called respiratory and spoke with Eugene [name] - no sign of anyone from that dept calling pt.  Regardless, he will receive call on Tuesday (bronch is Wed).  Pt will need to arrive at 7am at the main entrance to [facility]; NPO after midnight, if pt takes BP meds may take in the morning with 1 swallow of water; mild/conscious sedation  Called spoke with Eugene [name] and provided the above information - she was very grateful and denied any additional questions/concerns.  She is aware they will receive another call with this same information on Tuesday  Nothing further needed; will sign off

## 2017-09-23 ENCOUNTER — Ambulatory Visit (HOSPITAL_COMMUNITY)
Admission: RE | Admit: 2017-09-23 | Discharge: 2017-09-23 | Disposition: A | Discharge status: Home / Self Care | Payer: Medicare Other | Source: Ambulatory Visit | Attending: Pulmonary Disease | Admitting: Pulmonary Disease

## 2017-09-23 ENCOUNTER — Encounter (HOSPITAL_COMMUNITY)
Admission: RE | Disposition: A | Discharge status: Home / Self Care | Payer: Self-pay | Source: Ambulatory Visit | Attending: Pulmonary Disease

## 2017-09-23 ENCOUNTER — Ambulatory Visit (HOSPITAL_COMMUNITY): Payer: Medicare Other

## 2017-09-23 ENCOUNTER — Encounter (HOSPITAL_COMMUNITY): Payer: Self-pay | Admitting: Respiratory Therapy

## 2017-09-23 DIAGNOSIS — R918 Other nonspecific abnormal finding of lung field: Secondary | ICD-10-CM | POA: Diagnosis not present

## 2017-09-23 DIAGNOSIS — Z79899 Other long term (current) drug therapy: Secondary | ICD-10-CM | POA: Diagnosis not present

## 2017-09-23 DIAGNOSIS — Z87442 Personal history of urinary calculi: Secondary | ICD-10-CM | POA: Diagnosis not present

## 2017-09-23 DIAGNOSIS — J9811 Atelectasis: Secondary | ICD-10-CM | POA: Diagnosis not present

## 2017-09-23 DIAGNOSIS — Z87891 Personal history of nicotine dependence: Secondary | ICD-10-CM | POA: Diagnosis not present

## 2017-09-23 DIAGNOSIS — N400 Enlarged prostate without lower urinary tract symptoms: Secondary | ICD-10-CM | POA: Insufficient documentation

## 2017-09-23 DIAGNOSIS — R05 Cough: Secondary | ICD-10-CM | POA: Diagnosis not present

## 2017-09-23 DIAGNOSIS — E785 Hyperlipidemia, unspecified: Secondary | ICD-10-CM | POA: Diagnosis not present

## 2017-09-23 DIAGNOSIS — Z9889 Other specified postprocedural states: Secondary | ICD-10-CM

## 2017-09-23 HISTORY — PX: VIDEO BRONCHOSCOPY: SHX5072

## 2017-09-23 SURGERY — BRONCHOSCOPY, WITH FLUOROSCOPY
Anesthesia: Moderate Sedation | Laterality: Bilateral

## 2017-09-23 MED ORDER — LIDOCAINE HCL URETHRAL/MUCOSAL 2 % EX GEL
CUTANEOUS | Status: DC | PRN
  Administered 2017-09-23: 1

## 2017-09-23 MED ORDER — FENTANYL CITRATE (PF) 100 MCG/2ML IJ SOLN
INTRAMUSCULAR | Status: AC
  Filled 2017-09-23: qty 4

## 2017-09-23 MED ORDER — LIDOCAINE HCL 2 % EX GEL
1.0000 "application " | Freq: Once | CUTANEOUS | Status: DC
  Filled 2017-09-23: qty 5

## 2017-09-23 MED ORDER — MIDAZOLAM HCL 5 MG/ML IJ SOLN
INTRAMUSCULAR | Status: AC
  Filled 2017-09-23: qty 2

## 2017-09-23 MED ORDER — PHENYLEPHRINE HCL 0.25 % NA SOLN: 1.0000 | Freq: Four times a day (QID) | NASAL | Status: DC | PRN

## 2017-09-23 MED ORDER — PHENYLEPHRINE HCL 0.25 % NA SOLN
NASAL | Status: DC | PRN
  Administered 2017-09-23: 2 via NASAL

## 2017-09-23 MED ORDER — MIDAZOLAM HCL 10 MG/2ML IJ SOLN
INTRAMUSCULAR | Status: DC | PRN
  Administered 2017-09-23 (×2): 1 mg via INTRAVENOUS

## 2017-09-23 MED ORDER — FENTANYL CITRATE (PF) 100 MCG/2ML IJ SOLN
INTRAMUSCULAR | Status: DC | PRN
  Administered 2017-09-23 (×4): 25 ug via INTRAVENOUS

## 2017-09-23 MED ORDER — LIDOCAINE HCL 1 % IJ SOLN
INTRAMUSCULAR | Status: DC | PRN
  Administered 2017-09-23: 6 mL via RESPIRATORY_TRACT

## 2017-09-23 MED ORDER — SODIUM CHLORIDE 0.9 % IV SOLN
INTRAVENOUS | Status: DC
  Administered 2017-09-23: 08:00:00 via INTRAVENOUS

## 2017-09-23 NOTE — Op Note (Signed)
Indication : LUL unexplained increasinginfiltrates in this remote smoker.  Written informed consent was obtained prior to the procedure. The risks of the procedure including coughing, bleeding and the small chance of lung puncture requiring chest tube were discussed in great detail. The benefits & alternatives including serial follow up were also discussed.  2 mg versed & 100  mcg fentnayl used in divided doses during the procedure Time of conscious sedation 0827 -0834  Bronchoscope entered from the left nare. Upper airway nml Vocal cords showed nml appearance & motion. Trachea & bronchial tree examined to the subsegmental level. Mild amount of white secretions were noted. No endobronchial lesions seen.  Brushings & BAL were also obtained from the LUL.  Trans bronchial biopsies x 4 were obtained from the LUL under fluoroscopy.   There was moderate coughing  during the procedure. A CXR will be performed to r/o presence of pneumothorax.  Rigoberto Noel MD  [phone]

## 2017-09-23 NOTE — Discharge Instructions (Signed)
Flexible Bronchoscopy, Care After These instructions give you information on caring for yourself after your procedure. Your doctor may also give you more specific instructions. Call your doctor if you have any problems or questions after your procedure. Follow these instructions at home:  Do not eat or drink anything for 2 hours after your procedure. If you try to eat or drink before the medicine wears off, food or drink could go into your lungs. You could also burn yourself.  After 2 hours have passed and when you can cough and gag normally, you may eat soft food and drink liquids slowly.  The day after the test, you may eat your normal diet.  You may do your normal activities.  Keep all doctor visits. Get help right away if:  You get more and more short of breath.  You get light-headed.  You feel like you are going to pass out (faint).  You have chest pain.  You have new problems that worry you.  You cough up more than a little blood.  You cough up more blood than before. This information is not intended to replace advice given to you by your health care provider. Make sure you discuss any questions you have with your health care provider. Document Released: 03/09/2009 Document Revised: 10/18/2015 Document Reviewed: 01/14/2013 Elsevier Interactive Patient Education  2017 [place].  Nothing to eat or drink until  10:50    am today    09/23/2017 Any question or concern please call the office at [phone]

## 2017-09-23 NOTE — Progress Notes (Signed)
Video Bronchoscopy Done Intervention Bronchial washing Intervention Bronchial brushing Intervention Bronchial biopsy done  Procedure tolerated well Scope in at 08:35 Scope out at [place] First medication given at [place] given at 0934 Total medication given 2 mg Versed 100 mcg Fentanyl

## 2017-09-23 NOTE — Interval H&P Note (Signed)
History and Physical Interval Note:  09/23/2017 8:28 AM  Eugene [name]  has presented today for surgery, with the diagnosis of Sarcoidosis  The various methods of treatment have been discussed with the patient and family. After consideration of risks, benefits and other options for treatment, the patient has consented to  Procedure(s): VIDEO BRONCHOSCOPY WITH FLUORO (Bilateral) as a surgical intervention .  The patient's history has been reviewed, patient examined, no change in status, stable for surgery.  I have reviewed the patient's chart and labs.  Questions were answered to the patient's satisfaction.     Leanna Sato Elsworth Soho

## 2017-09-24 LAB — ACID FAST SMEAR (AFB): ACID FAST SMEAR - AFSCU2: NEGATIVE

## 2017-09-25 ENCOUNTER — Encounter (HOSPITAL_COMMUNITY): Payer: Self-pay | Admitting: Pulmonary Disease

## 2017-09-25 LAB — CULTURE, BAL-QUANTITATIVE W GRAM STAIN: Culture: 20000 — AB

## 2017-09-28 ENCOUNTER — Other Ambulatory Visit: Payer: Self-pay

## 2017-09-28 ENCOUNTER — Telehealth: Payer: Self-pay | Admitting: Pulmonary Disease

## 2017-09-28 ENCOUNTER — Other Ambulatory Visit: Payer: Self-pay | Admitting: Pulmonary Disease

## 2017-09-28 DIAGNOSIS — D869 Sarcoidosis, unspecified: Secondary | ICD-10-CM

## 2017-09-28 NOTE — Telephone Encounter (Signed)
Called and spoke with patient who asked me to talk to his wife. They still want to remain under Dr. Bari Mantis care and wanted to let us know that they are planning to ask for a second opinion at [facility].  Nothing currently needed

## 2017-09-29 ENCOUNTER — Telehealth: Payer: Self-pay | Admitting: Pulmonary Disease

## 2017-09-29 DIAGNOSIS — D869 Sarcoidosis, unspecified: Secondary | ICD-10-CM

## 2017-09-29 NOTE — Telephone Encounter (Signed)
Dr. Elsworth Soho can you call patient to discuss results. I didn't see where you called to discuss results. Please advise.     Conversation: Biopsy  (Newest Message First)  Dolores Lory, RN       09/28/17 3:17 PM  Note    Called and spoke with patient who asked me to talk to his wife. They still want to remain under Dr. Bari Mantis care and wanted to let us know that they are planning to ask for a second opinion at [facility].  Nothing currently needed

## 2017-10-01 MED ORDER — PREDNISONE 5 MG PO TABS: ORAL_TABLET | ORAL | 0 refills | Status: DC

## 2017-10-01 NOTE — Telephone Encounter (Signed)
Spoke with pt. He is requesting to speak to Dr. Elsworth Soho. The pt would not give me any further details.  Dr. Elsworth Soho - please advise. Thanks.

## 2017-10-01 NOTE — Telephone Encounter (Signed)
Rx for prednisone has been sent to preferred pharmacy. CT has been ordered. OV with RA has been scheduled for 11/10/17. Nothing further is needed.

## 2017-10-01 NOTE — Telephone Encounter (Signed)
Discussed again with patient, he is uncomfortable to wait and watch approach Suggest, empiric treatment with steroids and then rescan  Prednisone 10 mg daily for 3 weeks, then 5 mg daily for 3 weeks.  Then Obtain CT chest with IV contrast and schedule follow-up office visit after

## 2017-10-01 NOTE — Addendum Note (Signed)
Addended by: Maryanna Shape A on: 10/01/2017 03:55 PM   Modules accepted: Orders

## 2017-10-01 NOTE — Telephone Encounter (Signed)
Pt is calling back requesting a call from Dr. Elsworth Soho. Pt states he is having "second thoughts" and would like to speak with the doctor. Cb is [phone]

## 2017-10-05 ENCOUNTER — Telehealth: Payer: Self-pay | Admitting: Pulmonary Disease

## 2017-10-05 NOTE — Telephone Encounter (Signed)
Spoke with patient-aware that I will send message to RA to make sure he is okay with sending requested records to [facility] for second opinion.    Pt has 10/26/17 apt with [facility] Dr Wilber Oliphant.   Pt would like for [place] to send Ct Scan, blood work, Breathing test, and Results of Biopsy. AttnHarolyn Rutherford at [phone].  Pt is aware that patients usually go to Medical Records Dept and have information faxed from there-pt would like RA to give approval for [place] to send so he can save time and not have to go to Medical Records.   RA please advise. Thanks. ----U can send message back to me. Blair Hailey

## 2017-10-05 NOTE — Telephone Encounter (Signed)
Pt is calling back [phone]

## 2017-10-05 NOTE — Telephone Encounter (Signed)
Okay to send records

## 2017-10-05 NOTE — Telephone Encounter (Signed)
LMTCB-will ask for Andres Vest.  

## 2017-10-05 NOTE — Telephone Encounter (Signed)
No DPR on file for me to speak with wife. Pt's wife will have patient contact me directly-need to get DPR on file so we can speak with wife. Also, pt will need to sign Release form at Medical Records for chart notes, etc to be faxed over as requested.     Pt will ask to speak directly with me. I have paper work at [employer].

## 2017-10-06 NOTE — Telephone Encounter (Signed)
Pt called back -aware that all records have been sent per his request. Nothing more needed at this time.

## 2017-10-20 ENCOUNTER — Other Ambulatory Visit: Payer: Self-pay | Admitting: Cardiovascular Disease

## 2017-10-20 NOTE — Telephone Encounter (Signed)
Rx sent to pharmacy   

## 2017-10-25 LAB — FUNGUS CULTURE WITH STAIN

## 2017-10-25 LAB — FUNGAL ORGANISM REFLEX

## 2017-10-25 LAB — FUNGUS CULTURE RESULT

## 2017-10-26 DIAGNOSIS — R918 Other nonspecific abnormal finding of lung field: Secondary | ICD-10-CM | POA: Diagnosis not present

## 2017-10-28 ENCOUNTER — Other Ambulatory Visit: Payer: Self-pay | Admitting: Pulmonary Disease

## 2017-10-28 ENCOUNTER — Telehealth: Payer: Self-pay | Admitting: Pulmonary Disease

## 2017-10-28 NOTE — Telephone Encounter (Signed)
Received the letter from up front. Letter states:   "Dear Dr. Elsworth Soho,   [place] were right. I got a second opinion from Dr. Wilber Oliphant at [facility]. He reviewed your work and 14 years of CT scans starting in 2005. He saw the pattern get worse than better.  His opinion like yours, no cancer and no sarcoidosis. Maybe occasional reflux.  I'm okay with your first thought to check it again in 6 months. Please ask someone to cancel my upcoming appointment with you, lab and CT scans.  I'm most appreciative of your care and efforts on my behalf."

## 2017-10-28 NOTE — Telephone Encounter (Signed)
Called and spoke with Patient about rescheduling appointment. Patient stated that he wanted to think about it.  He may come in on 11/10/17 to see RA or he may reschedule for September per RA recommendations. He stated that he would call back and reschedule if that's what he decided to do.

## 2017-10-28 NOTE — Telephone Encounter (Signed)
Please reschedule his office visit after CT scan in 6 months -around September

## 2017-11-04 ENCOUNTER — Telehealth: Payer: Self-pay | Admitting: Pulmonary Disease

## 2017-11-04 NOTE — Telephone Encounter (Signed)
Spoke with the pt and notified of recs per RA  He verbalized understanding and will let us know if he wishes to schedule

## 2017-11-04 NOTE — Telephone Encounter (Signed)
Called and spoke with patient, he does not wish to have these done at this time and does not give me a reason for it. RA please advise is this ok.

## 2017-11-04 NOTE — Telephone Encounter (Signed)
Okay with me. Please have him call back if he wants [place] to schedule or personal placement follow-up at [facility] and we can stop asking him questions

## 2017-11-07 LAB — ACID FAST CULTURE WITH REFLEXED SENSITIVITIES (MYCOBACTERIA): Acid Fast Culture: NEGATIVE

## 2017-11-09 ENCOUNTER — Inpatient Hospital Stay: Admission: RE | Admit: 2017-11-09 | Payer: Medicare Other | Source: Ambulatory Visit

## 2017-11-09 ENCOUNTER — Other Ambulatory Visit: Payer: Medicare Other

## 2017-11-10 ENCOUNTER — Ambulatory Visit: Payer: Medicare Other | Admitting: Pulmonary Disease

## 2017-12-15 DIAGNOSIS — R062 Wheezing: Secondary | ICD-10-CM | POA: Diagnosis not present

## 2017-12-15 DIAGNOSIS — R05 Cough: Secondary | ICD-10-CM | POA: Diagnosis not present

## 2018-01-26 ENCOUNTER — Other Ambulatory Visit: Payer: Self-pay | Admitting: Cardiovascular Disease

## 2018-02-26 DIAGNOSIS — N210 Calculus in bladder: Secondary | ICD-10-CM | POA: Diagnosis not present

## 2018-02-26 DIAGNOSIS — R3914 Feeling of incomplete bladder emptying: Secondary | ICD-10-CM | POA: Diagnosis not present

## 2018-02-26 DIAGNOSIS — Z87442 Personal history of urinary calculi: Secondary | ICD-10-CM | POA: Diagnosis not present

## 2018-03-11 DIAGNOSIS — L570 Actinic keratosis: Secondary | ICD-10-CM | POA: Diagnosis not present

## 2018-03-11 DIAGNOSIS — L820 Inflamed seborrheic keratosis: Secondary | ICD-10-CM | POA: Diagnosis not present

## 2018-03-11 DIAGNOSIS — D1801 Hemangioma of skin and subcutaneous tissue: Secondary | ICD-10-CM | POA: Diagnosis not present

## 2018-03-11 DIAGNOSIS — L821 Other seborrheic keratosis: Secondary | ICD-10-CM | POA: Diagnosis not present

## 2018-03-11 DIAGNOSIS — Z85828 Personal history of other malignant neoplasm of skin: Secondary | ICD-10-CM | POA: Diagnosis not present

## 2018-03-18 DIAGNOSIS — Z23 Encounter for immunization: Secondary | ICD-10-CM | POA: Diagnosis not present

## 2018-03-23 DIAGNOSIS — D869 Sarcoidosis, unspecified: Secondary | ICD-10-CM | POA: Diagnosis not present

## 2018-03-23 DIAGNOSIS — E7849 Other hyperlipidemia: Secondary | ICD-10-CM | POA: Diagnosis not present

## 2018-03-23 DIAGNOSIS — I10 Essential (primary) hypertension: Secondary | ICD-10-CM | POA: Diagnosis not present

## 2018-03-23 DIAGNOSIS — R82998 Other abnormal findings in urine: Secondary | ICD-10-CM | POA: Diagnosis not present

## 2018-03-30 DIAGNOSIS — R972 Elevated prostate specific antigen [PSA]: Secondary | ICD-10-CM | POA: Diagnosis not present

## 2018-03-30 DIAGNOSIS — E7849 Other hyperlipidemia: Secondary | ICD-10-CM | POA: Diagnosis not present

## 2018-03-30 DIAGNOSIS — Z1389 Encounter for screening for other disorder: Secondary | ICD-10-CM | POA: Diagnosis not present

## 2018-03-30 DIAGNOSIS — Z6830 Body mass index (BMI) 30.0-30.9, adult: Secondary | ICD-10-CM | POA: Diagnosis not present

## 2018-03-30 DIAGNOSIS — I10 Essential (primary) hypertension: Secondary | ICD-10-CM | POA: Diagnosis not present

## 2018-03-30 DIAGNOSIS — J189 Pneumonia, unspecified organism: Secondary | ICD-10-CM | POA: Diagnosis not present

## 2018-03-30 DIAGNOSIS — I700 Atherosclerosis of aorta: Secondary | ICD-10-CM | POA: Diagnosis not present

## 2018-03-30 DIAGNOSIS — Z0000 Encounter for general adult medical examination without abnormal findings: Secondary | ICD-10-CM | POA: Diagnosis not present

## 2018-04-01 DIAGNOSIS — Z96651 Presence of right artificial knee joint: Secondary | ICD-10-CM | POA: Diagnosis not present

## 2018-04-07 DIAGNOSIS — Z1212 Encounter for screening for malignant neoplasm of rectum: Secondary | ICD-10-CM | POA: Diagnosis not present

## 2018-04-15 DIAGNOSIS — H5213 Myopia, bilateral: Secondary | ICD-10-CM | POA: Diagnosis not present

## 2018-04-15 DIAGNOSIS — Z961 Presence of intraocular lens: Secondary | ICD-10-CM | POA: Diagnosis not present

## 2018-04-15 DIAGNOSIS — H52203 Unspecified astigmatism, bilateral: Secondary | ICD-10-CM | POA: Diagnosis not present

## 2018-04-15 DIAGNOSIS — H524 Presbyopia: Secondary | ICD-10-CM | POA: Diagnosis not present

## 2018-08-29 ENCOUNTER — Other Ambulatory Visit: Payer: Self-pay | Admitting: Cardiovascular Disease

## 2018-08-30 ENCOUNTER — Other Ambulatory Visit: Payer: Self-pay | Admitting: Cardiovascular Disease

## 2018-08-30 NOTE — Telephone Encounter (Signed)
Simvastatin and Zetia refilled.

## 2018-08-31 NOTE — Telephone Encounter (Signed)
Zetia and Simvastatin refilled.

## 2018-10-12 DIAGNOSIS — L821 Other seborrheic keratosis: Secondary | ICD-10-CM | POA: Diagnosis not present

## 2018-10-12 DIAGNOSIS — C4442 Squamous cell carcinoma of skin of scalp and neck: Secondary | ICD-10-CM | POA: Diagnosis not present

## 2018-10-12 DIAGNOSIS — L820 Inflamed seborrheic keratosis: Secondary | ICD-10-CM | POA: Diagnosis not present

## 2018-10-12 DIAGNOSIS — Z85828 Personal history of other malignant neoplasm of skin: Secondary | ICD-10-CM | POA: Diagnosis not present

## 2018-10-12 DIAGNOSIS — D485 Neoplasm of uncertain behavior of skin: Secondary | ICD-10-CM | POA: Diagnosis not present

## 2018-10-13 NOTE — Progress Notes (Signed)
Virtual Visit via Video Note   This visit type was conducted due to national recommendations for restrictions regarding the COVID-19 Pandemic (e.g. social distancing) in an effort to limit this patient's exposure and mitigate transmission in our community.  Due to his co-morbid illnesses, this patient is at least at moderate risk for complications without adequate follow up.  This format is felt to be most appropriate for this patient at this time.  All issues noted in this document were discussed and addressed.  A limited physical exam was performed with this format.  Please refer to the patient's chart for his consent to telehealth for [facility].   Date:  10/15/2018   ID:  Eugene [name], DOB 1933/04/15, MRN [id number]  Patient Location: Home Provider Location: Home  PCP: Previously Dr. Ardeth Perfect, plans to establish with Dr. Derinda Late Cardiologist:  Shelva Majestic, MD Electrophysiologist:  None   Evaluation Performed:  Follow-Up Visit  Chief Complaint:  14 month F/U  History of Present Illness:    Eugene [name] is a 85 y.o. male who has a remote history of sarcoidosis and had been followed at [facility] with complete remission. He has a history of mild hyperlipidemia and has been on Vytorin 10/40. He also has a history of BPH on Proscar.  An echo Doppler study which was done to evaluate a systolic murmur in the aortic region noted in December 2013 showed normal systolic function with mild focal basal hypertrophy of the septum. He had normal systolic function. There was evidence for a moderate diffuse calcification involving the noncoronary cusp of a trileaflet aortic valve. He did have sclerosis without stenosis. Valve area was 2.55 cm. RV size was upper normal to mildly dilated. A nuclear perfusion study done in 2013 was unchanged from 6 years previously and continued to show normal perfusion.  Laboratory done by Dr. Deforest Hoyles in August 2015 was normal  with a hemoglobin of 14.5, hematocrit 43.8.  Renal function was excellent with a BUN of 19, Cr 0.99.  Fasting glucose was 78.  Lipid studies remained excellent with a total cholesterol 125, triglycerides 14, HDL cholesterol 44, and LDL cholesterol at 60.  Angiotensin converting enzyme was normal at 37 and last year was 33.  He underwent an echo Doppler study in 03/13/2015.  This revealed an ejection fraction at 60-65%. There was grade 1 diastolic dysfunction.  He had normal LV filling pressures.  His aortic valve was calcified and there was evidence for mild aortic stenosis with a mean gradient of 9, peak gradient of 18, and valve area of 1.87 cm and mild aortic insufficiency.  There was mild mitral annular calcification with trivial MR , mild LA dilatation, and moderate TR with PA pressure 30 mm. He underwent right knee replacement surgery by Dr. Moshe Salisbury in November.  Postoperatively had some swelling and lower extremity venous studies were negative.  Since I last saw him one year ago, he has done well from a cardiac standpoint.  Specifically, he denies any episodes of chest pain.  He is now able to play tennis again following his knee surgery.  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  He had also undergone follow-up CT imaging asked and was found to have a new irregularly-shaped nodular density in the left lower lobe, alt to represent a possible focus  of active infection/inflammation.  A follow-up CT was recommended.  After a course of antimicrobial therapy.  His CT also demonstrated a stable ectatic.  Borderline aneurysmal ascending thoracic aorta at 3.9 cm.  He had evidence for coronary calcification.   Laboratory in 2017 by his primary physician showed total cholesterol was 134, triglycerides 79, HDL 43, and LDL 75 on his current dose of  ezetimibe 10 mg and simvastatin 40 mg.   When I last saw him in March 2019 he was continuing to be active.  He was walking on elliptical machine at least 3-4 times per week and was playing tennis 2 times per week.  He denied any episodes of chest pain PND orthopnea or palpitations.   Since I last saw him, in 2019 he was found to have pulmonary infiltrate for which he was evaluated by Dr. Elsworth Soho.He had undergone bronchoscopy with biopsy. He ultimately underwent an evaluation at [facility] and apparently this had not been significantly changed over the past 16 years.  Presently, he continues to be asymptomatic and specifically denies any chest pain palpitations PND orthopnea.  He continues to walk on an elliptical machine and also rides a bike at least 14 miles at a time.  He also walks daily.  He previously had been followed by Dr. Ardeth Perfect but apparently has recently signed up for concierge medicine with Dr. Derinda Late but has not yet seen him for his initial laboratory which will be forthcoming.    The patient does have symptoms concerning for COVID-19 infection (fever, chills, cough, or new shortness of breath).    Past Medical History:  Diagnosis Date  . Bladder stones   . BPH (benign prostatic hyperplasia)   . Hernia of abdominal cavity   . History of kidney stones   . Hyperlipidemia   . Incomplete right bundle branch block   . Low O2 saturation 04/04/2015   Wife says O2 sats are in the low 90s base line.  Now in the 33s.  Will do incentive spirometry  . Primary localized osteoarthritis of right knee 04/02/2015  . Sarcoidosis    diagnosed 8-9 yrs ago  . Upper GI bleed 04/04/2015   Patient had coffee ground emesis that was Guiac positive on Monday.  EGD scheduled for 04/04/2015. Started on IV Protonix   Past Surgical History:  Procedure Laterality Date  . ANTERIOR CERVICAL DECOMP/DISCECTOMY FUSION  2007  . BRONCHOSCOPY  2007  . CARDIOVASCULAR STRESS TEST  04/29/2012   normal nuclear  stress study.   . CYSTOSCOPY  2004  . DOPPLER ECHOCARDIOGRAPHY  04/29/2012   EF not noted. small perimembranous ventricular septal defect. small left to right ventricular shunt. moderate diffuse calcification involving noncoronary cusp of the aortic valve.   . ESOPHAGOGASTRODUODENOSCOPY  04/04/2015  . ESOPHAGOGASTRODUODENOSCOPY N/A 04/04/2015   Procedure: ESOPHAGOGASTRODUODENOSCOPY (EGD);  Surgeon: Manus Gunning, MD;  Location: [place];  Service: Gastroenterology;  Laterality: N/A;  . INGUINAL HERNIA REPAIR Right 2009  . TOTAL KNEE ARTHROPLASTY Right 04/02/2015   Procedure: TOTAL KNEE ARTHROPLASTY;  Surgeon: Elsie Saas, MD;  Location: [place];  Service: Orthopedics;  Laterality: Right;  Marland Kitchen VIDEO BRONCHOSCOPY Bilateral 09/23/2017   Procedure: VIDEO BRONCHOSCOPY WITH FLUORO;  Surgeon: Rigoberto Noel, MD;  Location: [facility];  Service: Cardiopulmonary;  Laterality: Bilateral;     Current Meds  Medication Sig  . ezetimibe (ZETIA) 10 MG tablet TAKE (1) TABLET DAILY AT BEDTIME.  . finasteride (PROSCAR) 5 MG tablet Take 5 mg by mouth every evening.   Marland Kitchen  simvastatin (ZOCOR) 40 MG tablet TAKE (1) TABLET DAILY AT BEDTIME.  . [DISCONTINUED] predniSONE (DELTASONE) 5 MG tablet 2 tabs (10mg ) daily for 3 weeks then 1 tab (5mg ) daily for 3 weeks.     Allergies:   Patient has no known allergies.   Social History   Tobacco Use  . Smoking status: Former Smoker    Types: Pipe    Last attempt to quit: 05/26/1972    Years since quitting: 46.4  . Smokeless tobacco: Never Used  . Tobacco comment: Quit 43 year  Substance Use Topics  . Alcohol use: Yes    Alcohol/week: 4.0 standard drinks    Types: 4 Standard drinks or equivalent per week  . Drug use: No     Family Hx: The patient's family history includes Heart attack (age of onset: 67) in his father; Ovarian cancer in his mother.  ROS:   Please see the history of present illness.    He denies fevers chills night sweats There is no  cough, change in taste or smell There is no wheezing.  He status post bronchoscopy last year with subsequent evaluation at [facility] No chest pain PND orthopnea.  No presyncope or syncope. No nausea vomiting or diarrhea. He is no longer taking aspirin History of knee replacement surgery No bleeding Remote history of sarcoid, resolved No neurologic symptoms. Tolerating simvastatin without myalgias Sleeping well All other systems reviewed and are negative.   Prior CV studies:    ECHO Study Conclusions: 03/13/2015  - Left ventricle: The cavity size was normal. Wall thickness was   increased in a pattern of mild LVH. Systolic function was normal.   The estimated ejection fraction was in the range of 60% to 65%.   Wall motion was normal; there were no regional wall motion   abnormalities. Doppler parameters are consistent with abnormal   left ventricular relaxation (grade 1 diastolic dysfunction). The   E/e&' ratio is <8, suggesting normal LV filling pressure. - Aortic valve: Mildly calcified with mild stenosis. There was mild   regurgitation with 2 distinct jets. Mean gradient (S): 9 mm Hg.   Peak gradient (S): 18 mm Hg. Valve area (Vmax): 1.83 cm^2. Valve   area (Vmean): 1.87 cm^2. - Mitral valve: Calcified annulus. There was trivial regurgitation. - Left atrium: The atrium was mildly dilated. - Tricuspid valve: There was moderate regurgitation. - Pulmonary arteries: PA peak pressure: 30 mm Hg (S). - Inferior vena cava: The vessel was normal in size. The   respirophasic diameter changes were in the normal range (= 50%),   consistent with normal central venous pressure.  Impressions:  - Compared to a prior echo in 2013, there is now mild aortic   stenosis, mild AI, moderate TR with RVSP of 30 mmHg. LVEF is   60-65% with mild LVH, diastolic dysfunction and normal LV filling   pressure.e following studies were reviewed today:    Labs/Other Tests and Data Reviewed:    EKG:  An  ECG dated 08/06/2017 was personally reviewed today and demonstrated:  normal sinus rhythm at 65 bpm.  One isolated PVC.  Incomplete right bundle branch block.  Borderline LVH.  Normal intervals.  Recent Labs:  He was able to tell me recent laboratory from November 2019 that he had had with Dr. Particia Lather: Cholesterol 125, triglycerides 74, HDL 39, LDL 71 BUN 19, creatinine 1.1 Glucose 87, potassium 4.8, AST 27, ALT 30, bilirubin 0.8 Hemoglobin 14.9 hematocrit 47.4, white blood count 7.1  No results found  for requested labs within last 8760 hours.   Recent Lipid Panel No results found for: CHOL, TRIG, HDL, CHOLHDL, LDLCALC, LDLDIRECT  Wt Readings from Last 3 Encounters:  10/14/18 190 lb 4.8 oz (86.3 kg)  08/27/17 191 lb 3.2 oz (86.7 kg)  08/06/17 193 lb (87.5 kg)     Objective:    Vital Signs:  BP 135/74   Pulse (!) 56   Ht 5\' 9"  (1.753 m)   Wt 190 lb 4.8 oz (86.3 kg)   BMI 28.10 kg/m    Blood pressure has remained stable. Breathing is normal and unlabored. He is well-developed and well-nourished appears younger than stated age 85 is unchanged There is no neck vein distention There is no audible wheezing His rhythm is stable without irregularity I could not assess his aortic valve murmur by this video conference No chest discomfort to palpation or abdominal discomfort. No leg swelling Neurologically unchanged Normal cognition and affect   ASSESSMENT & PLAN:    1. Hyperlipidemia: He continues to be on simvastatin 40 mg and Zetia 10 mg.  LDL cholesterol 71.  Tolerates well, no myalgias 2. Aortic valve disease: His last echo Doppler study was in 2016 which demonstrated mild aortic stenosis.  I have recommended that in 6 months he undergo a 4-year follow-up evaluation for reassessment and I will see him in the office in follow-up of the echo Doppler evaluation. 3. Mild coronary calcification: Noted on remote CT.  The patient is asymptomatic.  Plan aggressive lipid-lowering  therapy strategy  He will be establishing new primary care with Dr. Derinda Late who will be checking complete laboratory at his initial encounter  COVID-19 Education: The signs and symptoms of COVID-19 were discussed with the patient and how to seek care for testing (follow up with PCP or arrange E-visit).  The importance of social distancing was discussed today.  Time:   Today, I have spent 29 minutes with the patient with telehealth technology discussing the above problems.     Medication Adjustments/Labs and Tests Ordered: Current medicines are reviewed at length with the patient today.  Concerns regarding medicines are outlined above.   Tests Ordered: Orders Placed This Encounter  Procedures  . ECHOCARDIOGRAM COMPLETE    Medication Changes: No orders of the defined types were placed in this encounter.   Disposition:  Follow up 76-month echo and follow-up office visit  Signed, Shelva Majestic, MD  10/15/2018 11:55 AM    [place]

## 2018-10-14 ENCOUNTER — Other Ambulatory Visit: Payer: Self-pay

## 2018-10-14 ENCOUNTER — Telehealth (INDEPENDENT_AMBULATORY_CARE_PROVIDER_SITE_OTHER): Payer: Medicare Other | Admitting: Cardiovascular Disease

## 2018-10-14 ENCOUNTER — Encounter: Payer: Self-pay | Admitting: Cardiovascular Disease

## 2018-10-14 VITALS — BP 135/74 | HR 56 | Ht 69.0 in | Wt 190.3 lb

## 2018-10-14 DIAGNOSIS — I2510 Atherosclerotic heart disease of native coronary artery without angina pectoris: Secondary | ICD-10-CM | POA: Diagnosis not present

## 2018-10-14 DIAGNOSIS — I350 Nonrheumatic aortic (valve) stenosis: Secondary | ICD-10-CM | POA: Diagnosis not present

## 2018-10-14 DIAGNOSIS — E7800 Pure hypercholesterolemia, unspecified: Secondary | ICD-10-CM | POA: Diagnosis not present

## 2018-10-14 DIAGNOSIS — I2584 Coronary atherosclerosis due to calcified coronary lesion: Secondary | ICD-10-CM | POA: Diagnosis not present

## 2018-10-14 NOTE — Patient Instructions (Signed)
Medication Instructions:  The current medical regimen is effective;  continue present plan and medications.  If you need a refill on your cardiac medications before your next appointment, please call your pharmacy. .  Testing/Procedures: Echocardiogram (6 months) - Your physician has requested that you have an echocardiogram. Echocardiography is a painless test that uses sound waves to create images of your heart. It provides your doctor with information about the size and shape of your heart and how well your heart's chambers and valves are working. This procedure takes approximately one hour. There are no restrictions for this procedure. This will be performed at our [facility] location - [address], [address].   Follow-Up: At [facility], you and your health needs are our priority.  As part of our continuing mission to provide you with exceptional heart care, we have created designated Provider Care Teams.  These Care Teams include your primary Cardiologist (physician) and Advanced Practice Providers (APPs -  Physician Assistants and Nurse Practitioners) who all work together to provide you with the care you need, when you need it. You will need a follow up appointment in 6 months (after ECHO).  Please call our office 2 months in advance to schedule this appointment.  You may see Dr.Kelly or one of the following Advanced Practice Providers on your designated Care Team: Almyra Deforest, [place] . Fabian Sharp, PA-C

## 2018-12-21 ENCOUNTER — Other Ambulatory Visit: Payer: Self-pay

## 2018-12-21 ENCOUNTER — Ambulatory Visit (HOSPITAL_COMMUNITY): Payer: Medicare Other | Attending: Internal Medicine

## 2018-12-21 DIAGNOSIS — I350 Nonrheumatic aortic (valve) stenosis: Secondary | ICD-10-CM | POA: Diagnosis not present

## 2019-01-25 DIAGNOSIS — S80812A Abrasion, left lower leg, initial encounter: Secondary | ICD-10-CM | POA: Diagnosis not present

## 2019-01-25 DIAGNOSIS — S0240EA Zygomatic fracture, right side, initial encounter for closed fracture: Secondary | ICD-10-CM | POA: Diagnosis not present

## 2019-01-25 DIAGNOSIS — S0081XA Abrasion of other part of head, initial encounter: Secondary | ICD-10-CM | POA: Diagnosis not present

## 2019-01-25 DIAGNOSIS — R402363 Coma scale, best motor response, obeys commands, at hospital admission: Secondary | ICD-10-CM | POA: Diagnosis present

## 2019-01-25 DIAGNOSIS — R58 Hemorrhage, not elsewhere classified: Secondary | ICD-10-CM | POA: Diagnosis not present

## 2019-01-25 DIAGNOSIS — R51 Headache: Secondary | ICD-10-CM | POA: Diagnosis not present

## 2019-01-25 DIAGNOSIS — S40812A Abrasion of left upper arm, initial encounter: Secondary | ICD-10-CM | POA: Diagnosis not present

## 2019-01-25 DIAGNOSIS — S0083XA Contusion of other part of head, initial encounter: Secondary | ICD-10-CM | POA: Diagnosis not present

## 2019-01-25 DIAGNOSIS — R4020 Unspecified coma: Secondary | ICD-10-CM | POA: Diagnosis not present

## 2019-01-25 DIAGNOSIS — R410 Disorientation, unspecified: Secondary | ICD-10-CM | POA: Diagnosis not present

## 2019-01-25 DIAGNOSIS — S299XXA Unspecified injury of thorax, initial encounter: Secondary | ICD-10-CM | POA: Diagnosis not present

## 2019-01-25 DIAGNOSIS — S0121XA Laceration without foreign body of nose, initial encounter: Secondary | ICD-10-CM | POA: Diagnosis present

## 2019-01-25 DIAGNOSIS — I350 Nonrheumatic aortic (valve) stenosis: Secondary | ICD-10-CM | POA: Diagnosis present

## 2019-01-25 DIAGNOSIS — S3993XA Unspecified injury of pelvis, initial encounter: Secondary | ICD-10-CM | POA: Diagnosis not present

## 2019-01-25 DIAGNOSIS — S3992XA Unspecified injury of lower back, initial encounter: Secondary | ICD-10-CM | POA: Diagnosis not present

## 2019-01-25 DIAGNOSIS — I454 Nonspecific intraventricular block: Secondary | ICD-10-CM | POA: Diagnosis present

## 2019-01-25 DIAGNOSIS — S3991XA Unspecified injury of abdomen, initial encounter: Secondary | ICD-10-CM | POA: Diagnosis not present

## 2019-01-25 DIAGNOSIS — S199XXA Unspecified injury of neck, initial encounter: Secondary | ICD-10-CM | POA: Diagnosis not present

## 2019-01-25 DIAGNOSIS — S022XXA Fracture of nasal bones, initial encounter for closed fracture: Secondary | ICD-10-CM | POA: Diagnosis present

## 2019-01-25 DIAGNOSIS — S298XXA Other specified injuries of thorax, initial encounter: Secondary | ICD-10-CM | POA: Diagnosis not present

## 2019-01-25 DIAGNOSIS — S0181XA Laceration without foreign body of other part of head, initial encounter: Secondary | ICD-10-CM | POA: Diagnosis present

## 2019-01-25 DIAGNOSIS — R404 Transient alteration of awareness: Secondary | ICD-10-CM | POA: Diagnosis not present

## 2019-01-25 DIAGNOSIS — S022XXB Fracture of nasal bones, initial encounter for open fracture: Secondary | ICD-10-CM | POA: Diagnosis not present

## 2019-01-25 DIAGNOSIS — K7689 Other specified diseases of liver: Secondary | ICD-10-CM | POA: Diagnosis not present

## 2019-01-25 DIAGNOSIS — E785 Hyperlipidemia, unspecified: Secondary | ICD-10-CM | POA: Diagnosis present

## 2019-01-25 DIAGNOSIS — Z7901 Long term (current) use of anticoagulants: Secondary | ICD-10-CM | POA: Diagnosis not present

## 2019-01-25 DIAGNOSIS — S80811A Abrasion, right lower leg, initial encounter: Secondary | ICD-10-CM | POA: Diagnosis not present

## 2019-01-25 DIAGNOSIS — R402143 Coma scale, eyes open, spontaneous, at hospital admission: Secondary | ICD-10-CM | POA: Diagnosis present

## 2019-01-25 DIAGNOSIS — S0003XA Contusion of scalp, initial encounter: Secondary | ICD-10-CM | POA: Diagnosis present

## 2019-01-25 DIAGNOSIS — R402243 Coma scale, best verbal response, confused conversation, at hospital admission: Secondary | ICD-10-CM | POA: Diagnosis present

## 2019-01-26 ENCOUNTER — Emergency Department (HOSPITAL_COMMUNITY)
Admission: EM | Admit: 2019-01-26 | Discharge: 2019-01-26 | Disposition: A | Discharge status: Home / Self Care | Payer: Medicare Other | Attending: Emergency Medicine | Admitting: Emergency Medicine

## 2019-01-26 ENCOUNTER — Emergency Department (HOSPITAL_COMMUNITY): Payer: Medicare Other

## 2019-01-26 ENCOUNTER — Other Ambulatory Visit: Payer: Self-pay

## 2019-01-26 DIAGNOSIS — T07XXXD Unspecified multiple injuries, subsequent encounter: Secondary | ICD-10-CM | POA: Diagnosis present

## 2019-01-26 DIAGNOSIS — Y9355 Activity, bike riding: Secondary | ICD-10-CM | POA: Diagnosis not present

## 2019-01-26 DIAGNOSIS — S0003XA Contusion of scalp, initial encounter: Secondary | ICD-10-CM | POA: Diagnosis not present

## 2019-01-26 DIAGNOSIS — S022XXD Fracture of nasal bones, subsequent encounter for fracture with routine healing: Secondary | ICD-10-CM

## 2019-01-26 DIAGNOSIS — S0240ED Zygomatic fracture, right side, subsequent encounter for fracture with routine healing: Secondary | ICD-10-CM | POA: Diagnosis not present

## 2019-01-26 DIAGNOSIS — S199XXA Unspecified injury of neck, initial encounter: Secondary | ICD-10-CM | POA: Diagnosis not present

## 2019-01-26 DIAGNOSIS — Y929 Unspecified place or not applicable: Secondary | ICD-10-CM | POA: Diagnosis not present

## 2019-01-26 DIAGNOSIS — S298XXA Other specified injuries of thorax, initial encounter: Secondary | ICD-10-CM | POA: Diagnosis not present

## 2019-01-26 DIAGNOSIS — S3991XA Unspecified injury of abdomen, initial encounter: Secondary | ICD-10-CM | POA: Diagnosis not present

## 2019-01-26 DIAGNOSIS — Y999 Unspecified external cause status: Secondary | ICD-10-CM | POA: Diagnosis not present

## 2019-01-26 DIAGNOSIS — S299XXA Unspecified injury of thorax, initial encounter: Secondary | ICD-10-CM | POA: Diagnosis not present

## 2019-01-26 DIAGNOSIS — S0240EA Zygomatic fracture, right side, initial encounter for closed fracture: Secondary | ICD-10-CM | POA: Diagnosis not present

## 2019-01-26 DIAGNOSIS — R51 Headache: Secondary | ICD-10-CM | POA: Diagnosis not present

## 2019-01-26 DIAGNOSIS — S022XXA Fracture of nasal bones, initial encounter for closed fracture: Secondary | ICD-10-CM | POA: Diagnosis not present

## 2019-01-26 DIAGNOSIS — S3993XA Unspecified injury of pelvis, initial encounter: Secondary | ICD-10-CM | POA: Diagnosis not present

## 2019-01-26 DIAGNOSIS — W19XXXA Unspecified fall, initial encounter: Secondary | ICD-10-CM

## 2019-01-26 DIAGNOSIS — S3992XA Unspecified injury of lower back, initial encounter: Secondary | ICD-10-CM | POA: Diagnosis not present

## 2019-01-26 LAB — BASIC METABOLIC PANEL
Anion gap: 10 (ref 5–15)
BUN: 22 mg/dL (ref 8–23)
CO2: 25 mmol/L (ref 22–32)
Calcium: 9.7 mg/dL (ref 8.9–10.3)
Chloride: 102 mmol/L (ref 98–111)
Creatinine, Ser: 1.15 mg/dL (ref 0.61–1.24)
GFR calc Af Amer: >60 mL/min (ref >60)
GFR calc non Af Amer: 58 mL/min — ABNORMAL LOW (ref >60)
Glucose, Bld: 83 mg/dL (ref 70–99)
Potassium: 4.2 mmol/L (ref 3.5–5.1)
Sodium: 137 mmol/L (ref 135–145)

## 2019-01-26 LAB — CBC WITH DIFFERENTIAL/PLATELET
Abs Immature Granulocytes: 0.12 10*3/uL — ABNORMAL HIGH (ref 0.00–0.07)
Basophils Absolute: 0 10*3/uL (ref 0.0–0.1)
Basophils Relative: 0 %
Eosinophils Absolute: 0.1 10*3/uL (ref 0.0–0.5)
Eosinophils Relative: 1 %
HCT: 43.9 % (ref 39.0–52.0)
Hemoglobin: 14.3 g/dL (ref 13.0–17.0)
Immature Granulocytes: 1 %
Lymphocytes Relative: 19 %
Lymphs Abs: 2 10*3/uL (ref 0.7–4.0)
MCH: 30.2 pg (ref 26.0–34.0)
MCHC: 32.6 g/dL (ref 30.0–36.0)
MCV: 92.8 fL (ref 80.0–100.0)
Monocytes Absolute: 1.3 10*3/uL — ABNORMAL HIGH (ref 0.1–1.0)
Monocytes Relative: 12 %
Neutro Abs: 7.2 10*3/uL (ref 1.7–7.7)
Neutrophils Relative %: 67 %
Platelets: 176 10*3/uL (ref 150–400)
RBC: 4.73 MIL/uL (ref 4.22–5.81)
RDW: 13.5 % (ref 11.5–15.5)
WBC: 10.8 10*3/uL — ABNORMAL HIGH (ref 4.0–10.5)
nRBC: 0 % (ref 0.0–0.2)

## 2019-01-26 MED ORDER — GENERIC EXTERNAL MEDICATION
Status: DC
Start: ? — End: 2019-01-26

## 2019-01-26 MED ORDER — BACITRACIN ZINC 500 UNIT/GM EX OINT
TOPICAL_OINTMENT | CUTANEOUS | Status: DC
Start: 2019-01-25 — End: 2019-01-26

## 2019-01-26 MED ORDER — POLYETHYLENE GLYCOL 3350 17 G PO PACK
17.00 | PACK | ORAL | Status: DC
Start: 2019-01-25 — End: 2019-01-26

## 2019-01-26 MED ORDER — ACETAMINOPHEN 325 MG PO TABS
650.00 | ORAL_TABLET | ORAL | Status: DC
Start: 2019-01-25 — End: 2019-01-26

## 2019-01-26 MED ORDER — ACETAMINOPHEN 500 MG PO TABS
1000.0000 mg | ORAL_TABLET | Freq: Once | ORAL | Status: AC
  Administered 2019-01-26: 22:00:00 1000 mg via ORAL
  Filled 2019-01-26: qty 2

## 2019-01-26 MED ORDER — SENNOSIDES-DOCUSATE SODIUM 8.6-50 MG PO TABS
1.00 | ORAL_TABLET | ORAL | Status: DC
Start: 2019-01-25 — End: 2019-01-26

## 2019-01-26 MED ORDER — LACTATED RINGERS IV SOLN
INTRAVENOUS | Status: DC
Start: ? — End: 2019-01-26

## 2019-01-26 MED ORDER — OXYCODONE HCL 5 MG PO TABS
5.00 | ORAL_TABLET | ORAL | Status: DC
Start: ? — End: 2019-01-26

## 2019-01-26 MED ORDER — SALINE SPRAY 0.65 % NA SOLN: 1.0000 | NASAL | 0 refills | Status: DC | PRN

## 2019-01-26 NOTE — ED Notes (Signed)
Discharge instructions discussed with pt. Pt verbalized understanding and follow up appts. No questions at this time

## 2019-01-26 NOTE — ED Notes (Signed)
Patient transported to CT 
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
## 2019-01-26 NOTE — Discharge Instructions (Signed)
Use the nasal spray for your nose.  Do not blow your nose.  May place ice to your face.  Follow-up with ENT on Friday.  You may see anyone in their office.  I have also referred you to the outpatient concussion clinic.  Please follow-up.  Follow-up with your cardiologist as needed.  Follow-up with PCP in 2 to 3 days for reevaluation.  Return to the ED for any worsening symptoms  Take Tylenol as needed for pain.  Do not take more than 4000 mg daily.

## 2019-01-27 ENCOUNTER — Telehealth: Payer: Self-pay
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
## 2019-01-28 ENCOUNTER — Ambulatory Visit (INDEPENDENT_AMBULATORY_CARE_PROVIDER_SITE_OTHER)
Admission: RE | Admit: 2019-01-28 | Discharge: 2019-01-28 | Disposition: A | Discharge status: Home / Self Care | Payer: Medicare Other | Source: Ambulatory Visit | Attending: Family Medicine | Admitting: Family Medicine

## 2019-01-28 ENCOUNTER — Ambulatory Visit (INDEPENDENT_AMBULATORY_CARE_PROVIDER_SITE_OTHER): Payer: Medicare Other | Admitting: Family Medicine

## 2019-01-28 ENCOUNTER — Other Ambulatory Visit: Payer: Self-pay

## 2019-01-28 ENCOUNTER — Encounter: Payer: Self-pay | Admitting: Family Medicine

## 2019-01-28 VITALS — BP 132/64 | HR 71 | Ht 69.0 in

## 2019-01-28 DIAGNOSIS — S022XXA Fracture of nasal bones, initial encounter for closed fracture: Secondary | ICD-10-CM

## 2019-01-28 DIAGNOSIS — I2584 Coronary atherosclerosis due to calcified coronary lesion: Secondary | ICD-10-CM

## 2019-01-28 DIAGNOSIS — I350 Nonrheumatic aortic (valve) stenosis: Secondary | ICD-10-CM

## 2019-01-28 DIAGNOSIS — I2510 Atherosclerotic heart disease of native coronary artery without angina pectoris: Secondary | ICD-10-CM | POA: Diagnosis not present

## 2019-01-28 DIAGNOSIS — R160 Hepatomegaly, not elsewhere classified: Secondary | ICD-10-CM | POA: Insufficient documentation

## 2019-01-28 DIAGNOSIS — S43101A Unspecified dislocation of right acromioclavicular joint, initial encounter: Secondary | ICD-10-CM

## 2019-01-28 DIAGNOSIS — S4991XA Unspecified injury of right shoulder and upper arm, initial encounter: Secondary | ICD-10-CM | POA: Diagnosis not present

## 2019-01-28 DIAGNOSIS — S069X9A Unspecified intracranial injury with loss of consciousness of unspecified duration, initial encounter: Secondary | ICD-10-CM

## 2019-01-28 DIAGNOSIS — M25511 Pain in right shoulder: Secondary | ICD-10-CM

## 2019-01-28 MED ORDER — GABAPENTIN 100 MG PO CAPS: 100.0000 mg | ORAL_CAPSULE | Freq: Every day | ORAL | 0 refills | Status: DC

## 2019-01-28 NOTE — Assessment & Plan Note (Signed)
Comminuted nasal fracture.  Displaced.  Patient is able to breathe through both nares at this time and is able to move air in and out.  Significant swelling noted.  Discussed with patient in great length about this.  Patient is concerned about anesthesia for any type of repair.  Patient will discuss with plastic surgeon or primary care provider

## 2019-01-28 NOTE — Assessment & Plan Note (Signed)
Incidental finding, will have patient's primary care weigh in to see if further work-up is necessary for this individual.  No other signs or symptoms that make me concerned history of sarcoidosis, no history of cancer
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
## 2019-01-28 NOTE — Patient Instructions (Addendum)
Good to see you.  Ice 20 minutes 2 times daily. Usually after activity and before bed. Arnica lotion  Gabapentin 100 mg at night  Vitamin D 2000 IU daily  voltaren gel 2x daily Sling for right arm  See me again in 3 weeks
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
## 2019-02-01 ENCOUNTER — Telehealth: Payer: Self-pay | Admitting: Cardiovascular Disease

## 2019-02-01 DIAGNOSIS — M25511 Pain in right shoulder: Secondary | ICD-10-CM | POA: Diagnosis not present

## 2019-02-01 DIAGNOSIS — S022XXS Fracture of nasal bones, sequela: Secondary | ICD-10-CM | POA: Diagnosis not present

## 2019-02-01 DIAGNOSIS — I450 Right fascicular block: Secondary | ICD-10-CM | POA: Diagnosis not present

## 2019-02-01 DIAGNOSIS — S022XXA Fracture of nasal bones, initial encounter for closed fracture: Secondary | ICD-10-CM | POA: Diagnosis not present

## 2019-02-01 DIAGNOSIS — S0240ED Zygomatic fracture, right side, subsequent encounter for fracture with routine healing: Secondary | ICD-10-CM | POA: Diagnosis not present

## 2019-02-01 DIAGNOSIS — S060X9D Concussion with loss of consciousness of unspecified duration, subsequent encounter: Secondary | ICD-10-CM | POA: Diagnosis not present
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
## 2019-02-02 NOTE — Telephone Encounter (Signed)
Appt scheduled with Eugene [name] 02-07-2019
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
## 2019-02-02 NOTE — Telephone Encounter (Signed)
Spoke to patient about rescheduling appt for Sept. 21st @ 1140 with Dr. Claiborne Billings.

## 2019-02-02 NOTE — Telephone Encounter (Signed)
Late entry:last evening-ok per Dr Oval Linsey to wait until scheduled appointment to be seen

## 2019-02-02 NOTE — Telephone Encounter (Signed)
SPOKE TO Eugene [name] AT DR [facility] OFFICE  PER DR [facility] - PREFER PATIENT BE SEEN BY A CARDIOLOGIST  BEFORE 02/14/19 .    APPOINTMENT SCHEDULE FOR 02/03/19 AT 10:40 AM  Eugene [name] WILL CALL PATIENT AND THE 02/14/19 APPOINTMENT WILL STAY.

## 2019-02-02 NOTE — Telephone Encounter (Signed)
Follow up   Per Dr. Deboraha Sprang office wants to know if he can be worked in this week. Please call Judeen Hammans

## 2019-02-03 ENCOUNTER — Ambulatory Visit: Payer: Medicare Other | Admitting: Podiatry

## 2019-02-03 ENCOUNTER — Encounter: Payer: Self-pay | Admitting: Cardiology

## 2019-02-03 ENCOUNTER — Other Ambulatory Visit: Payer: Self-pay

## 2019-02-03 ENCOUNTER — Ambulatory Visit (INDEPENDENT_AMBULATORY_CARE_PROVIDER_SITE_OTHER): Payer: Medicare Other | Admitting: Cardiology

## 2019-02-03 VITALS — BP 111/65 | HR 74 | Temp 97.7°F | Ht 69.0 in | Wt 193.8 lb

## 2019-02-03 DIAGNOSIS — I2584 Coronary atherosclerosis due to calcified coronary lesion: Secondary | ICD-10-CM | POA: Diagnosis not present

## 2019-02-03 DIAGNOSIS — Z862 Personal history of diseases of the blood and blood-forming organs and certain disorders involving the immune mechanism: Secondary | ICD-10-CM | POA: Insufficient documentation

## 2019-02-03 DIAGNOSIS — I452 Bifascicular block: Secondary | ICD-10-CM | POA: Diagnosis not present

## 2019-02-03 DIAGNOSIS — Z01812 Encounter for preprocedural laboratory examination: Secondary | ICD-10-CM | POA: Diagnosis not present

## 2019-02-03 DIAGNOSIS — I2510 Atherosclerotic heart disease of native coronary artery without angina pectoris: Secondary | ICD-10-CM

## 2019-02-03 DIAGNOSIS — R55 Syncope and collapse: Secondary | ICD-10-CM

## 2019-02-03 NOTE — Patient Instructions (Signed)
Medication Instructions:  Your physician recommends that you continue on your current medications as directed. Please refer to the Current Medication list given to you today.  If you need a refill on your cardiac medications before your next appointment, please call your pharmacy.   Lab work: BMET 1 week prior to procedure. If you have labs (blood work) drawn today and your tests are completely normal, you will receive your results only by: [place] (if you have MyChart) OR A paper copy in the mail If you have any lab test that is abnormal or we need to change your treatment, we will call you to review the results.  Testing/Procedures: Your physician has requested that you have a cardiac MRI. Cardiac MRI uses a computer to create images of your heart as its beating, producing both still and moving pictures of your heart and major blood vessels. For further information please visit [email or web address] Please follow the instruction sheet given to you today for more information.  Your physician has recommended that you wear a 14 DAY ZIO-PATCH monitor. The Zio patch cardiac monitor continuously records heart rhythm data for up to 14 days, this is for patients being evaluated for multiple types heart rhythms. For the first 24 hours post application, please avoid getting the Zio monitor wet in the shower or by excessive sweating during exercise. After that, feel free to carry on with regular activities. Keep soaps and lotions away from the ZIO XT Patch.  This will be placed at our [facility] location - [address], [address].      Follow-Up: Recommend keeping scheduled follow-up appointment with Dr. Claiborne Billings
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
## 2019-02-07 ENCOUNTER — Ambulatory Visit: Payer: Medicare Other | Admitting: Physician Assistant

## 2019-02-08 DIAGNOSIS — S0240ED Zygomatic fracture, right side, subsequent encounter for fracture with routine healing: Secondary | ICD-10-CM | POA: Diagnosis not present

## 2019-02-08 DIAGNOSIS — Z7289 Other problems related to lifestyle: Secondary | ICD-10-CM | POA: Diagnosis not present

## 2019-02-08 DIAGNOSIS — S022XXD Fracture of nasal bones, subsequent encounter for fracture with routine healing: Secondary | ICD-10-CM | POA: Diagnosis not present

## 2019-02-08 DIAGNOSIS — W19XXXD Unspecified fall, subsequent encounter: Secondary | ICD-10-CM | POA: Diagnosis not present

## 2019-02-08 DIAGNOSIS — Z87891 Personal history of nicotine dependence: Secondary | ICD-10-CM | POA: Diagnosis not present

## 2019-02-09 ENCOUNTER — Telehealth: Payer: Self-pay | Admitting: *Deleted

## 2019-02-09 NOTE — Telephone Encounter (Signed)
Patient was instructed not to do monitor until after his MRI.  No one has called him to set up the MRI.   We were able to have the patient scheduled at the hospital for his MRI, Friday, 02/11/2019 at 9:00AM.  Patient is aware he should be at the hospital by 8:45 AM. 14 day ZIO AT long term live telemetry monitor will be mailed to the patients home using [place] Priority mail.  He should receive his monitor in 3-5 days. Patient instructed not to apply monitor until after his MRI.  Instructions reviewed briefly as they are included in his monitor kit.

## 2019-02-10 ENCOUNTER — Telehealth (HOSPITAL_COMMUNITY): Payer: Self-pay | Admitting: Emergency Medicine

## 2019-02-10 NOTE — Telephone Encounter (Signed)
Reaching out to patient to offer assistance regarding upcoming cardiac imaging study; pt verbalizes understanding of appt date/time, parking situation and where to check in,  and verified current allergies; name and call back number provided for further questions should they arise Marchia Bond RN Navigator Cardiac Imaging [facility] [phone] office [phone] cell  Py claustrophobic, [place] office prescribed antianxiety medication  Also has hearing aids with metal (removable)

## 2019-02-11 ENCOUNTER — Other Ambulatory Visit: Payer: Self-pay

## 2019-02-11 ENCOUNTER — Ambulatory Visit (HOSPITAL_COMMUNITY)
Admission: RE | Admit: 2019-02-11 | Discharge: 2019-02-11 | Disposition: A | Discharge status: Home / Self Care | Payer: Medicare Other | Source: Ambulatory Visit | Attending: Cardiology | Admitting: Cardiology

## 2019-02-11 DIAGNOSIS — R55 Syncope and collapse: Secondary | ICD-10-CM | POA: Insufficient documentation

## 2019-02-11 DIAGNOSIS — Z862 Personal history of diseases of the blood and blood-forming organs and certain disorders involving the immune mechanism: Secondary | ICD-10-CM | POA: Insufficient documentation

## 2019-02-11 MED ORDER — GADOBUTROL 1 MMOL/ML IV SOLN
10.0000 mL | Freq: Once | INTRAVENOUS | Status: AC | PRN
  Administered 2019-02-11: 11:00:00 10 mL via INTRAVENOUS

## 2019-02-14 ENCOUNTER — Other Ambulatory Visit: Payer: Self-pay

## 2019-02-14 ENCOUNTER — Ambulatory Visit (INDEPENDENT_AMBULATORY_CARE_PROVIDER_SITE_OTHER): Payer: Medicare Other | Admitting: Cardiovascular Disease

## 2019-02-14 ENCOUNTER — Encounter: Payer: Self-pay | Admitting: Cardiovascular Disease

## 2019-02-14 VITALS — BP 137/70 | HR 53 | Ht 69.0 in | Wt 195.0 lb

## 2019-02-14 DIAGNOSIS — R55 Syncope and collapse: Secondary | ICD-10-CM | POA: Diagnosis not present

## 2019-02-14 DIAGNOSIS — E7800 Pure hypercholesterolemia, unspecified: Secondary | ICD-10-CM

## 2019-02-14 DIAGNOSIS — R911 Solitary pulmonary nodule: Secondary | ICD-10-CM

## 2019-02-14 DIAGNOSIS — I2584 Coronary atherosclerosis due to calcified coronary lesion: Secondary | ICD-10-CM | POA: Diagnosis not present

## 2019-02-14 DIAGNOSIS — I2510 Atherosclerotic heart disease of native coronary artery without angina pectoris: Secondary | ICD-10-CM | POA: Diagnosis not present

## 2019-02-14 DIAGNOSIS — D1803 Hemangioma of intra-abdominal structures: Secondary | ICD-10-CM | POA: Diagnosis not present

## 2019-02-14 DIAGNOSIS — I350 Nonrheumatic aortic (valve) stenosis: Secondary | ICD-10-CM | POA: Diagnosis not present

## 2019-02-14 DIAGNOSIS — I452 Bifascicular block: Secondary | ICD-10-CM | POA: Diagnosis not present

## 2019-02-14 NOTE — Progress Notes (Signed)
Patient ID: Eugene [name], male   DOB: [birth year], 85 y.o.   MRN: [id number]    Primary MD: Dr. Derinda Late  HPI: Eugene [name] is a 85 y.o. male who presents to the office today for a cardiology evaluation following a recent syncopal spell which occurred while he was riding a bike.  Eugene [name] has a remote history of sarcoidosis and had been followed at [facility] with complete remission. He has a history of mild hyperlipidemia and has been on Vytorin 10/40. He also has a history of BPH on Proscar.  An echo Doppler study which was done to evaluate a systolic murmur in the aortic region noted in December 2013 showed normal systolic function with mild focal basal hypertrophy of the septum. He had normal systolic function. There was evidence for a moderate diffuse calcification involving the noncoronary cusp of a trileaflet aortic valve. He did have sclerosis without stenosis. Valve area was 2.55 cm. RV size was upper normal to mildly dilated. A nuclear perfusion study done in 2013 was unchanged from 6 years previously and continued to show normal perfusion.  Laboratory done by Dr. Deforest Hoyles in August 2015 was normal with a hemoglobin of 14.5, hematocrit 43.8.  Renal function was excellent with a BUN of 19, Cr 0.99.  Fasting glucose was 78.  Lipid studies remained excellent with a total cholesterol 125, triglycerides 14, HDL cholesterol 44, and LDL cholesterol at 60.  Angiotensin converting enzyme was normal at 37 and last year was 33.  He underwent an echo Doppler study in 03/13/2015.  This revealed an ejection fraction at 60-65%. There was grade 1 diastolic dysfunction.  He had normal LV filling pressures.  His aortic valve was calcified and there was evidence for mild aortic stenosis with a mean gradient of 9, peak gradient of 18, and valve area of 1.87 cm and mild aortic insufficiency.  There was mild mitral annular calcification with trivial MR , mild LA dilatation, and  moderate TR with PA pressure 30 mm. He underwent right knee replacement surgery by Dr. Moshe Salisbury in November.  Postoperatively had some swelling and lower extremity venous studies were negative.  When I saw him in 2017, he was doing well from a cardiac standpoint.   Specifically, he denied any episodes of chest pain.  He was able to play tennis again following his knee surgery.  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  He had also undergone follow-up CT imaging asked and was found to have a new irregularly-shaped nodular density in the left lower lobe, alt to represent a possible focus of active infection/inflammation.  A follow-up CT was recommended.  After a course of antimicrobial therapy.  His CT also demonstrated a stable ectatic.  Borderline aneurysmal ascending thoracic aorta at 3.9 cm.  He had evidence for coronary calcification.   Laboratory in 2017 by his primary physician showed total cholesterol was 134, triglycerides 79, HDL 43, and LDL 75 on his current dose of ezetimibe 10 mg and simvastatin 40 mg.   When I saw him in March 2019 he was remaining active and was walking on elliptical machine at least 3-4 times per week and was playing tennis 2 times per week.  He denied any chest pain or shortness of breath.  He denies palpitations.   In 2019, he was found  to have a pulmonary infiltrate for which she was evaluated by Dr. Elsworth Soho.  He had undergone bronchoscopy with biopsy and ultimately underwent an evaluation at [facility] and this nodule had not significantly changed over the past 16 years.  I last evaluated him in May 2020 and a telemedicine visit.  At that time he denied any chest pain, palpitations, presyncope or syncope.  He denied any PND orthopnea.  He had previously been followed by Dr. Ardeth Perfect but he had recently signed up for concierge  medicine with Dr. Derinda Late.  During that evaluation I recommended he undergo a follow-up echo Doppler study to reassess his aortic valve since in 2016 he was found to have mild aortic stenosis.  He underwent an echo Doppler study on December 21, 2018 which continues to show normal systolic function with EF at 60 to 65%, and moderate asymmetric left ventricular hypertrophy.  There was grade 2 diastolic dysfunction.  There was mild aortic stenosis with moderate calcification of the aortic valve.  He had a mean systolic gradient of 11 mm and his peak gradient was 20.8 mm.  Calculated aortic valve was 1.7 cm and was consistent with mild aortic stenosis.  Eugene [name] has continued to be active.  On January 25, 2019 while biking at [place] he apparently had a syncopal spell.  He was found by bystanders.  Apparently he is amnesic to the event and when he woke up he was at [facility] in the emergency room.  It did not appear that he was aware when he had fallen since there was no apparent marks on his hands to break a fall.  A CT scan of the maxillofacial area demonstrated a comminuted displaced bilateral nasal bone fracture as well as fracture of the right zygomatic arch.  There was a frontal scalp hematoma.  He had extensive CT imaging of his chest abdomen and pelvis.  Of note, on his chest CT he was felt to have severe coronary artery atherosclerotic calcifications.  There was no evidence for acute traumatic injury within the chest abdomen or pelvis.  He had a 3.6 cm indeterminate liver mass, 4.3 cm indeterminate left renal lesion and had multiple bladder stones and bladder diverticula likely related to chronic outlet obstruction from an enlarged prostate and had nonobstructive bilateral nephrolithiasis.  Upon his return to [facility], he was evaluated by Hulan Saas in the [place] primary care.  He subsequently was evaluated by Dr. Al Pimple in our office for cardiology evaluation  during my absence.  With his memory absence, it is felt that he had a concussion.  When seen by Dr. Harrell Gave, she reviewed his extensive evaluation and recent echo which confirmed that he did not have significant aortic stenosis.  Because of his remote history of sarcoidosis, she scheduled him to undergo cardiac MRI imaging to assess for scar/infiltrative disease.  He was also scheduled to undergo 14-day life telemetry monitor.  He presents to the office today for evaluation with me.  Past Medical History:  Diagnosis Date   Bladder stones    BPH (benign prostatic hyperplasia)    Hernia of abdominal cavity    History of kidney stones    Hyperlipidemia    Incomplete right bundle branch block    Low O2 saturation 04/04/2015   Wife says O2 sats are in the low 90s base line.  Now in the 6s.  Will do incentive spirometry   Primary localized osteoarthritis of right knee 04/02/2015   Sarcoidosis  diagnosed 8-9 yrs ago   Upper GI bleed 04/04/2015   Patient had coffee ground emesis that was Guiac positive on Monday.  EGD scheduled for 04/04/2015. Started on IV Protonix    Past Surgical History:  Procedure Laterality Date   ANTERIOR CERVICAL DECOMP/DISCECTOMY FUSION  2007   BRONCHOSCOPY  2007   CARDIOVASCULAR STRESS TEST  04/29/2012   normal nuclear stress study.    CYSTOSCOPY  2004   DOPPLER ECHOCARDIOGRAPHY  04/29/2012   EF not noted. small perimembranous ventricular septal defect. small left to right ventricular shunt. moderate diffuse calcification involving noncoronary cusp of the aortic valve.    ESOPHAGOGASTRODUODENOSCOPY  04/04/2015   ESOPHAGOGASTRODUODENOSCOPY N/A 04/04/2015   Procedure: ESOPHAGOGASTRODUODENOSCOPY (EGD);  Surgeon: Manus Gunning, MD;  Location: [facility];  Service: Gastroenterology;  Laterality: N/A;   INGUINAL HERNIA REPAIR Right 2009   TOTAL KNEE ARTHROPLASTY Right 04/02/2015   Procedure: TOTAL KNEE ARTHROPLASTY;  Surgeon: Elsie Saas,  MD;  Location: [place];  Service: Orthopedics;  Laterality: Right;   VIDEO BRONCHOSCOPY Bilateral 09/23/2017   Procedure: VIDEO BRONCHOSCOPY WITH FLUORO;  Surgeon: Rigoberto Noel, MD;  Location: [facility];  Service: Cardiopulmonary;  Laterality: Bilateral;    No Known Allergies  Current Outpatient Medications  Medication Sig Dispense Refill   ezetimibe (ZETIA) 10 MG tablet TAKE (1) TABLET DAILY AT BEDTIME. 90 tablet 1   finasteride (PROSCAR) 5 MG tablet Take 5 mg by mouth every evening.      simvastatin (ZOCOR) 40 MG tablet TAKE (1) TABLET DAILY AT BEDTIME. 90 tablet 1   No current facility-administered medications for this visit.     Social History   Socioeconomic History   Marital status: Married    Spouse name: Not on file   Number of children: Not on file   Years of education: Not on file   Highest education level: Not on file  Occupational History   Not on file  Social Needs   Financial resource strain: Not on file   Food insecurity    Worry: Not on file    Inability: Not on file   Transportation needs    Medical: Not on file    Non-medical: Not on file  Tobacco Use   Smoking status: Former Smoker    Types: Pipe    Quit date: 05/26/1972    Years since quitting: 46.7   Smokeless tobacco: Never Used   Tobacco comment: Quit 43 year  Substance and Sexual Activity   Alcohol use: Yes    Alcohol/week: 4.0 standard drinks    Types: 4 Standard drinks or equivalent per week   Drug use: No   Sexual activity: Not on file  Lifestyle   Physical activity    Days per week: Not on file    Minutes per session: Not on file   Stress: Not on file  Relationships   Social connections    Talks on phone: Not on file    Gets together: Not on file    Attends religious service: Not on file    Active member of club or organization: Not on file    Attends meetings of clubs or organizations: Not on file    Relationship status: Not on file   Intimate partner  violence    Fear of current or ex partner: Not on file    Emotionally abused: Not on file    Physically abused: Not on file    Forced sexual activity: Not on file  Other Topics Concern  Not on file  Social History Narrative   Not on file   Socially he is married. He is regional and 9 grandchildren. One son lives in [place], or lives in [place]. There is no tobacco use. He does take occasional alcohol. He does exercise.  Family History  Problem Relation Age of Onset   Ovarian cancer Mother    Heart attack Father 100    ROS General: Negative; No fevers, chills, or night sweats;  HEENT: Negative; No changes in vision or hearing, sinus congestion, difficulty swallowing Pulmonary: Negative; No cough, wheezing, shortness of breath, hemoptysis Cardiovascular: Negative; No chest pain, presyncope, syncope, palpitations GI: Negative; No nausea, vomiting, diarrhea, or abdominal pain GU: Negative; No dysuria, hematuria, or difficulty voiding Musculoskeletal: Recent bilateral nasal bone fractures and fracture of right zygomatic arch Hematologic/Oncology: Negative; no easy bruising, bleeding Remote history of sarcoidosis, completely resolved Endocrine: Negative; no heat/cold intolerance; no diabetes Neuro: Amnesic to his syncopal spell Skin: Negative; No rashes or skin lesions Psychiatric: Negative; No behavioral problems, depression Sleep: Negative; No snoring, daytime sleepiness, hypersomnolence, bruxism, restless legs, hypnogognic hallucinations, no cataplexy Other comprehensive 14 point system review is negative.   PE BP 137/70    Pulse (!) 53    Ht _0  (1.753 m)    Wt 195 lb (88.5 kg)    BMI 28.80 kg/m    Repeat blood pressure by me was 126/74 supine and 122/72 standing  Wt Readings from Last 3 Encounters:  02/14/19 195 lb (88.5 kg)  02/03/19 193 lb 12.8 oz (87.9 kg)  01/26/19 190 lb (86.2 kg)   General: Alert, oriented, no distress.  Skin: normal turgor, no rashes, warm  and dry HEENT: Normocephalic, atraumatic. Pupils equal round and reactive to light; sclera anicteric; extraocular muscles intact; recent facial trauma Nose:zygomatic arch fracture and bilateral nasal bone fractures Mouth/Parynx benign; Mallinpatti scale 2 Neck: No JVD, no carotid bruits; normal carotid upstroke Lungs: clear to ausculatation and percussion; no wheezing or rales Chest wall: without tenderness to palpitation Heart: PMI not displaced, RRR, s1 s2 normal, 2/6 early peaking systolic murmur in the aortic area, no diastolic murmur, no rubs, gallops, thrills, or heaves Abdomen: soft, nontender; no hepatosplenomehaly, BS+; abdominal aorta nontender and not dilated by palpation. Back: no CVA tenderness Pulses 2+ Musculoskeletal: full range of motion, normal strength, no joint deformities Extremities: no clubbing cyanosis or edema, Homan's sign negative  Neurologic: grossly nonfocal; Cranial nerves grossly wnl Psychologic: Normal mood and affect   ECG (independently read by me): Sinus bradycardia at 53 bpm, right bundle branch block with repolarization changes.  QRS duration 156 ms.  Mild LVH by voltage in aVL.  QTc interval 439 ms  March 2019 ECG (independently read by me): normal sinus rhythm at 65 bpm.  One isolated PVC.  Incomplete right bundle branch block.  Borderline LVH.  Normal intervals.  February 2018 ECG (independently read by me): Sinus bradycardia at 56 bpm.  Mild sinus arrhythmia.  Mild LVH.  Normal intervals.  No ST segment changes.  February 2017 ECG (independently read by me):  Normal sinus rhythm at 63 bpm.  Early transition.  No ST segment changes.  December 2015 ECG (independently read by me): Normal sinus rhythm at 66 bpm.  Mild RV conduction delay.  Early transition.  December 2014 ECG: Sinus rhythm with an occasional PVC; normal intervals.  LABS: I personally reviewed the laboratory from [place] done on 02/19/2016.  BUN 18, creatinine 0.9.   Ingrown 14.7, hematocrit 44.9.  Normal LFTs.  TSH 2.08.  April lipoprotein B 66.  Normal PSA.  Lipid studies as noted above.  Most recent lipid studies from March 03, 2017: Total cholesterol 134, HDL 39, LDL 70, triglycerides 124.    BMP Latest Ref Rng & Units 01/26/2019 08/27/2017 04/06/2015  Glucose 70 - 99 mg/dL 83 126(H) 114(H)  BUN 8 - 23 mg/dL _0 Creatinine 0.61 - 1.24 mg/dL 1.15 0.96 0.95  Sodium 135 - 145 mmol/L 137 137 136  Potassium 3.5 - 5.1 mmol/L 4.2 4.3 4.4  Chloride 98 - 111 mmol/L 102 103 95(L)  CO2 22 - 32 mmol/L _1 Calcium 8.9 - 10.3 mg/dL 9.7 10.1 9.1   Hepatic Function Latest Ref Rng & Units 03/20/2015 08/21/2014  Total Protein 6.5 - 8.1 g/dL 6.6 7.0  Albumin 3.5 - 5.0 g/dL 4.0 4.1  AST 15 - 41 U/L 29 32  ALT 17 - 63 U/L 30 32  Alk Phosphatase 38 - 126 U/L 68 73  Total Bilirubin 0.3 - 1.2 mg/dL 0.8 0.7   CBC Latest Ref Rng & Units 01/26/2019 04/06/2015 04/05/2015  WBC 4.0 - 10.5 K/uL 10.8(H) 11.3(H) 10.8(H)  Hemoglobin 13.0 - 17.0 g/dL 14.3 8.9(L) 8.7(L)  Hematocrit 39.0 - 52.0 % 43.9 27.1(L) 26.5(L)  Platelets 150 - 400 K/uL 176 152 131(L)   Lab Results  Component Value Date   MCV 92.8 01/26/2019   MCV 91.2 04/06/2015   MCV 92.7 04/05/2015   Additional studies reviewed: I reviewed all the records from [facility] including his CT images of January 25, 2019 following his recent syncopal spell induced  Trauma.  ECHO 7//28/2020 IMPRESSIONS  1. The left ventricle has normal systolic function with an ejection fraction of 60-65%. The cavity size was normal. There is moderate asymmetric left ventricular hypertrophy. Left ventricular diastolic Doppler parameters are consistent with  pseudonormalization.  2. The right ventricle has normal systolic function. The cavity was normal. There is no increase in right ventricular wall thickness. Right ventricular systolic pressure could not be assessed.  3. Left atrial size was mildly dilated.   4. The aortic valve is abnormal. Moderate calcification of the aortic valve. Aortic valve regurgitation is trivial by color flow Doppler. Mild stenosis with a mean systolic gradient of 11 mmHg of the aortic valve.  5. The aorta is normal in size and structure when indexed to body surface area (ascending aorta measures 38 mm).  CARDIAC MRI 02/11/2019 IMPRESSION: 1. Normal left ventricular size with mild LV hypertrophy. This appears concentric and not asymmetric. EF 60%, normal wall motion.  2. The RV is mildly dilated with mild to moderate hypokinesis, EF 35%.  3. There is no definite myocardial LGE, so no definitive evidence for prior MI, infiltrative disease, or myocarditis.  Abnormal right ventricle. However, there is no definitive evidence for cardiac sarcoidosis or hypertrophic cardiomyoapthy.  IMPRESSION:  1. Syncope and collapse   2. Calcification of coronary artery   3. RBBB (right bundle branch block with left anterior fascicular block)   4. Aortic valve stenosis, mild   5. Pure hypercholesterolemia   6. Lung nodule   7. Liver hemangioma     ASSESSMENT AND PLAN: Eugene [name] is an 85 year old gentleman who has a remote history of prior sarcoid disease with ultimate complete remisson.  He has a history of mild hyperlipidemia which has been aggressively controlled.  Remotely, he was found to have mild coronary calcification on CT imaging.  I have been  aggressive with lipid therapy in attempt to potentially induce plaque regression and he has been on Zetia/simvastatin 10/40 with LDL cholesterols previously documented to be 70.  In 2016 he was found to have aortic stenosis which was mild with mild aortic insufficiency with a mean gradient of 9 and peak gradient of 18.  Recently, [place], he apparently had a syncopal spell.  He has no recollection of falling and when he awakened he was in [facility] emergency room.  It does not appear that he had any  significant awareness of rhythm disturbance and apparently when EMS arrived he must have had 6 stable cardiac rhythm.  In July 2020 I had obtained a follow-up echo Doppler study which still confirmed just mild aortic stenosis with a valve area of 1.7 cm.  It is unlikely that the severity of his aortic stenosis accounts for his syncope.  Recently, his ECG now shows definite right bundle branch block which in the past had just been incomplete right bundle.  On his ECG today he is in sinus rhythm, although bradycardic at 53 with right bundle branch block.  QTc interval is normal.  Of concern, however is his CT of his chest at [facility] which apparently showed severe coronary atherosclerosis.  As result, I am scheduling him to undergo a coronary CTA for further evaluation of potential significant coronary obstructive disease.  It is possible he does not have any chest pain but may have been ischemic if he does have significant obstructive disease contributing to his syncopal spell.  I am also scheduling him for carotid duplex imaging to assess his carotid arteries and vertebral flow.  He has not yet received in the mail his ZIO Patch monitor which she will wear for 14 days.  I will see him in 3 to 4 weeks following the completion of the above studies and further recommendations will be made at that time.   Troy Sine, MD, [facility]  02/16/2019 7:05 PM

## 2019-02-14 NOTE — Patient Instructions (Signed)
Medication Instructions:  The current medical regimen is effective;  continue present plan and medications.  If you need a refill on your cardiac medications before your next appointment, please call your pharmacy.   Lab work: BMET 1 week before CTA (will know when to come in once CTA is scheduled)   Testing/Procedures: Your physician has requested that you have cardiac CT. Cardiac computed tomography (CT) is a painless test that uses an x-ray machine to take clear, detailed pictures of your heart. For further information please visit [facility]ugeFiesta[facility][email or web address]. Please follow instruction sheet as given.  Follow-Up: At [facility], you and your health needs are our priority.  As part of our continuing mission to provide you with exceptional heart care, we have created designated Provider Care Teams.  These Care Teams include your primary Cardiologist (physician) and Advanced Practice Providers (APPs -  Physician Assistants and Nurse Practitioners) who all work together to provide you with the care you need, when you need it. You will need a follow up appointment in 6 weeks. You may see Dr.Kelly or one of the following Advanced Practice Providers on your designated Care Team: Almyra Deforest, [place] . Fabian Sharp, PA-C  Any Other Special Instructions Will Be Listed Below (If Applicable):   [facility] [address] [place], [place] [address] [phone]   If scheduled at [facility], please arrive at the [facility] main entrance of [facility] 30-45 minutes prior to test start time. Proceed to the [facility] Radiology Department (first floor) to check-in and test prep.  Please follow these instructions carefully (unless otherwise directed):  Hold all erectile dysfunction medications at least 3 days (72 hrs) prior to test.  On the Night Before the Test: . Be sure to Drink plenty of water. . Do not consume any caffeinated/decaffeinated beverages or chocolate 12 hours  prior to your test. . Do not take any antihistamines 12 hours prior to your test.  On the Day of the Test: . Drink plenty of water. Do not drink any water within one hour of the test. . Do not eat any food 4 hours prior to the test. . You may take your regular medications prior to the test.  . Take metoprolol (Lopressor) two hours prior to test. . HOLD Furosemide/Hydrochlorothiazide morning of the test. . FEMALES- please wear underwire-free bra if available  After the Test: . Drink plenty of water. . After receiving IV contrast, you may experience a mild flushed feeling. This is normal. . On occasion, you may experience a mild rash up to 24 hours after the test. This is not dangerous. If this occurs, you can take Benadryl 25 mg and increase your fluid intake. . If you experience trouble breathing, this can be serious. If it is severe call 911 IMMEDIATELY. If it is mild, please call our office. . If you take any of these medications: Glipizide/Metformin, Avandament, Glucavance, please do not take 48 hours after completing test unless otherwise instructed.    Please contact the cardiac imaging nurse navigator should you have any questions/concerns Marchia Bond, RN Navigator Cardiac Imaging Progress and Vascular Services [phone] Office  [phone] Cell

## 2019-02-15 ENCOUNTER — Telehealth: Payer: Self-pay | Admitting: Cardiovascular Disease

## 2019-02-15 NOTE — Telephone Encounter (Signed)
Can someone from device clinic please assist patient in placing their monitor?

## 2019-02-15 NOTE — Telephone Encounter (Signed)
New Message:  Wife of the patient called. They received their short term monitor today, and they are having difficulty putting it on. I tried to advise the patient to contact the company that made the monitor, but she was worried that the company may not be able to help her.   She would like someone from Dr. Evette Georges office to walk her through the process. Please call cv

## 2019-02-16 ENCOUNTER — Encounter (INDEPENDENT_AMBULATORY_CARE_PROVIDER_SITE_OTHER): Payer: Medicare Other

## 2019-02-16 ENCOUNTER — Telehealth: Payer: Self-pay | Admitting: Cardiovascular Disease

## 2019-02-16 ENCOUNTER — Other Ambulatory Visit: Payer: Self-pay

## 2019-02-16 ENCOUNTER — Encounter: Payer: Self-pay | Admitting: Cardiovascular Disease

## 2019-02-16 DIAGNOSIS — R55 Syncope and collapse: Secondary | ICD-10-CM

## 2019-02-16 NOTE — Telephone Encounter (Signed)
Patient scheduled to come into office at 3:00 PM today, to have monitor applied.

## 2019-02-16 NOTE — Telephone Encounter (Signed)
New message   Patient having hard time putting monitor on would like to come in to have it placed.  Please call to discuss.

## 2019-02-16 NOTE — Telephone Encounter (Signed)
Called patient, advised that a message had been sent yesterday to the device clinic- hopefully they would contact patient today to go through how to place the monitors on, if not- give the okay for him to come in and have it placed in office.  Patient was appreciative for call. No other questions.

## 2019-02-16 NOTE — Telephone Encounter (Signed)
Thank you!  Sorry about that

## 2019-02-16 NOTE — Telephone Encounter (Signed)
  Needs assistance to put monitor on

## 2019-02-18 ENCOUNTER — Encounter: Payer: Self-pay | Admitting: Family Medicine

## 2019-02-18 ENCOUNTER — Other Ambulatory Visit: Payer: Self-pay

## 2019-02-18 ENCOUNTER — Ambulatory Visit (INDEPENDENT_AMBULATORY_CARE_PROVIDER_SITE_OTHER): Payer: Medicare Other | Admitting: Family Medicine

## 2019-02-18 DIAGNOSIS — I2584 Coronary atherosclerosis due to calcified coronary lesion: Secondary | ICD-10-CM

## 2019-02-18 DIAGNOSIS — S43101A Unspecified dislocation of right acromioclavicular joint, initial encounter: Secondary | ICD-10-CM | POA: Diagnosis not present

## 2019-02-18 DIAGNOSIS — I2510 Atherosclerotic heart disease of native coronary artery without angina pectoris: Secondary | ICD-10-CM | POA: Diagnosis not present

## 2019-02-18 NOTE — Patient Instructions (Signed)
Try to do exercises Keep hands in peripheral vision See me in 6 weeks if not completely resolved

## 2019-02-18 NOTE — Assessment & Plan Note (Signed)
Patient has made significant strides at this time.  I believe the patient will continue to improve.  We do not feel that anything else will be necessary at this time, discussed icing regimen, home exercise, which activities to do which wants to avoid.  Patient will increase activity slowly over the course the next several weeks.  Follow-up with me again as needed as long as patient improves.  Feel that the more concerning aspect is figuring out why patient did have this syncope episode.
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
## 2019-02-24 ENCOUNTER — Ambulatory Visit (HOSPITAL_COMMUNITY)
Admission: RE | Admit: 2019-02-24 | Discharge: 2019-02-24 | Disposition: A | Discharge status: Home / Self Care | Payer: Medicare Other | Source: Ambulatory Visit | Attending: Cardiology | Admitting: Cardiology

## 2019-02-24 ENCOUNTER — Other Ambulatory Visit: Payer: Self-pay

## 2019-02-24 DIAGNOSIS — R55 Syncope and collapse: Secondary | ICD-10-CM | POA: Diagnosis not present

## 2019-02-24 DIAGNOSIS — I2510 Atherosclerotic heart disease of native coronary artery without angina pectoris: Secondary | ICD-10-CM | POA: Diagnosis not present

## 2019-02-24 DIAGNOSIS — I350 Nonrheumatic aortic (valve) stenosis: Secondary | ICD-10-CM | POA: Diagnosis not present

## 2019-02-24 DIAGNOSIS — I2584 Coronary atherosclerosis due to calcified coronary lesion: Secondary | ICD-10-CM | POA: Diagnosis not present

## 2019-02-24 LAB — BASIC METABOLIC PANEL
BUN/Creatinine Ratio: 21 (ref 10–24)
BUN: 21 mg/dL (ref 8–27)
CO2: 25 mmol/L (ref 20–29)
Calcium: 10.3 mg/dL — ABNORMAL HIGH (ref 8.6–10.2)
Chloride: 102 mmol/L (ref 96–106)
Creatinine, Ser: 1.02 mg/dL (ref 0.76–1.27)
GFR calc Af Amer: 78 mL/min/{1.73_m2} (ref >59)
GFR calc non Af Amer: 67 mL/min/{1.73_m2} (ref >59)
Glucose: 88 mg/dL (ref 65–99)
Potassium: 4.8 mmol/L (ref 3.5–5.2)
Sodium: 139 mmol/L (ref 134–144)

## 2019-02-24 LAB — LIPID PANEL
Chol/HDL Ratio: 2.9 ratio (ref 0.0–5.0)
Cholesterol, Total: 134 mg/dL (ref 100–199)
HDL: 46 mg/dL (ref >39)
LDL Chol Calc (NIH): 72 mg/dL (ref 0–99)
Triglycerides: 83 mg/dL (ref 0–149)
VLDL Cholesterol Cal: 16 mg/dL (ref 5–40)

## 2019-02-24 LAB — TSH: TSH: 2.44 u[IU]/mL (ref 0.450–4.500)

## 2019-03-01 ENCOUNTER — Telehealth: Payer: Self-pay | Admitting: Cardiovascular Disease

## 2019-03-01 DIAGNOSIS — N401 Enlarged prostate with lower urinary tract symptoms: Secondary | ICD-10-CM | POA: Diagnosis not present

## 2019-03-01 DIAGNOSIS — R350 Frequency of micturition: Secondary | ICD-10-CM | POA: Diagnosis not present

## 2019-03-01 DIAGNOSIS — Z87442 Personal history of urinary calculi: Secondary | ICD-10-CM | POA: Diagnosis not present

## 2019-03-01 DIAGNOSIS — N210 Calculus in bladder: Secondary | ICD-10-CM | POA: Diagnosis not present

## 2019-03-01 NOTE — Telephone Encounter (Signed)
Called patient wife- she was wanting to know if they could see Dr.Kelly any sooner, since his test were completed. I advised with wife- that the 13th of November would be the soonest, but would keep an eye out for any open spots.  Patient wife verbalized understanding.

## 2019-03-01 NOTE — Telephone Encounter (Signed)
New message:    Patient wife calling concerning her husband results. She would like for some to call concering some test results.

## 2019-03-02 DIAGNOSIS — S0121XD Laceration without foreign body of nose, subsequent encounter: Secondary | ICD-10-CM | POA: Diagnosis not present

## 2019-03-02 DIAGNOSIS — Z7289 Other problems related to lifestyle: Secondary | ICD-10-CM | POA: Diagnosis not present

## 2019-03-02 DIAGNOSIS — Z87891 Personal history of nicotine dependence: Secondary | ICD-10-CM | POA: Diagnosis not present

## 2019-03-04 ENCOUNTER — Telehealth (HOSPITAL_COMMUNITY): Payer: Self-pay | Admitting: Emergency Medicine

## 2019-03-04 NOTE — Telephone Encounter (Signed)
Left message on voicemail with name and callback number Yara Tomkinson RN Navigator Cardiac Imaging  [facility] [phone] Office [phone] Cell  

## 2019-03-07 ENCOUNTER — Ambulatory Visit (HOSPITAL_COMMUNITY)
Admission: RE | Admit: 2019-03-07 | Discharge: 2019-03-07 | Disposition: A | Discharge status: Home / Self Care | Payer: Medicare Other | Source: Ambulatory Visit | Attending: Cardiovascular Disease | Admitting: Cardiovascular Disease

## 2019-03-07 ENCOUNTER — Other Ambulatory Visit: Payer: Self-pay

## 2019-03-07 ENCOUNTER — Encounter (HOSPITAL_COMMUNITY): Payer: Self-pay

## 2019-03-07 DIAGNOSIS — I2584 Coronary atherosclerosis due to calcified coronary lesion: Secondary | ICD-10-CM | POA: Insufficient documentation

## 2019-03-07 DIAGNOSIS — E7800 Pure hypercholesterolemia, unspecified: Secondary | ICD-10-CM | POA: Insufficient documentation

## 2019-03-07 DIAGNOSIS — I2510 Atherosclerotic heart disease of native coronary artery without angina pectoris: Secondary | ICD-10-CM | POA: Diagnosis not present

## 2019-03-07 DIAGNOSIS — I350 Nonrheumatic aortic (valve) stenosis: Secondary | ICD-10-CM | POA: Insufficient documentation

## 2019-03-07 DIAGNOSIS — R55 Syncope and collapse: Secondary | ICD-10-CM | POA: Insufficient documentation

## 2019-03-07 DIAGNOSIS — I452 Bifascicular block: Secondary | ICD-10-CM | POA: Diagnosis not present

## 2019-03-07 IMAGING — CT CT HEART MORP W/ CTA COR W/ SCORE W/ CA W/CM &/OR W/O CM
4 of 7 series · 8 of 20 positions shown, 9 images · non-contrast
Comparison: 08/12/2017
COMPARISON: 08/12/2017

Addendum:
EXAM:
OVER-READ INTERPRETATION  CT CHEST

The following report is an over-read performed by radiologist Dr.
Kaki Jim [REDACTED] on 03/07/2019. This
over-read does not include interpretation of cardiac or coronary
anatomy or pathology. The coronary CTA Interpretation by the
cardiologist is attached.
TECHNIQUE: The patient was scanned on a [employer] Force scanner.

[Series 6: best diast 75 % · axial · 0.39mm/px · z∈[+161,+208]mm · 2 of 358 slices shown, 3 images]
[im 120/358  vessel]
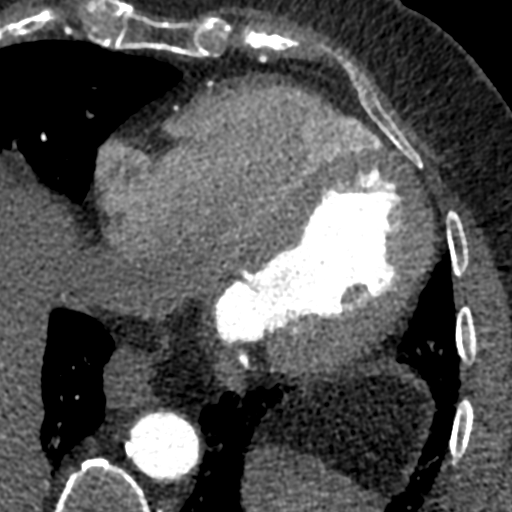
[im 120/358  lung]
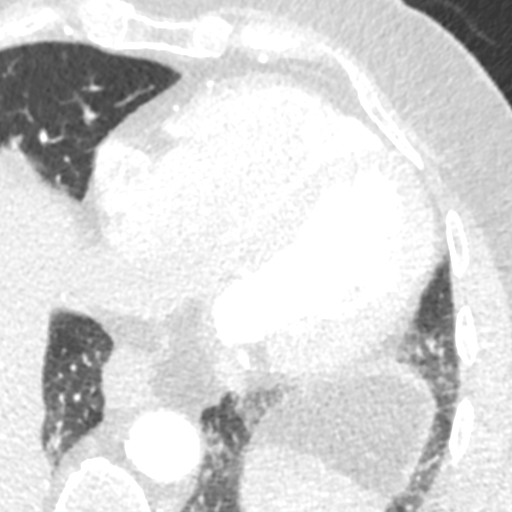
[im 239/358  vessel]
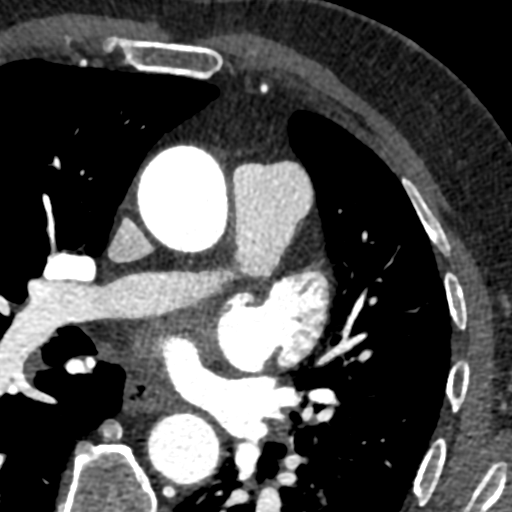

[Series 7: best syst 75 % · axial · 0.39mm/px · z∈[+161,+208]mm · 2 of 358 slices shown]
[im 120/358  vessel]
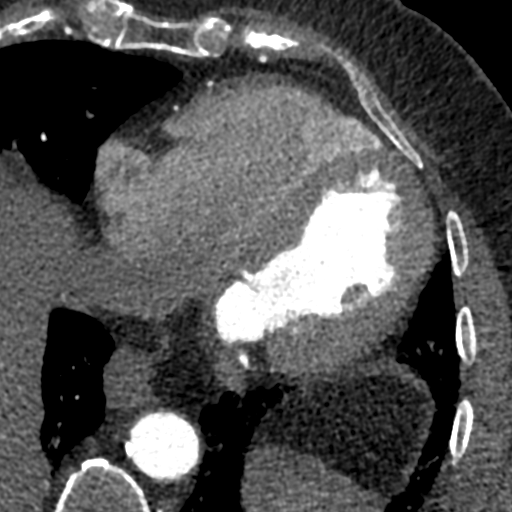
[im 239/358  vessel]
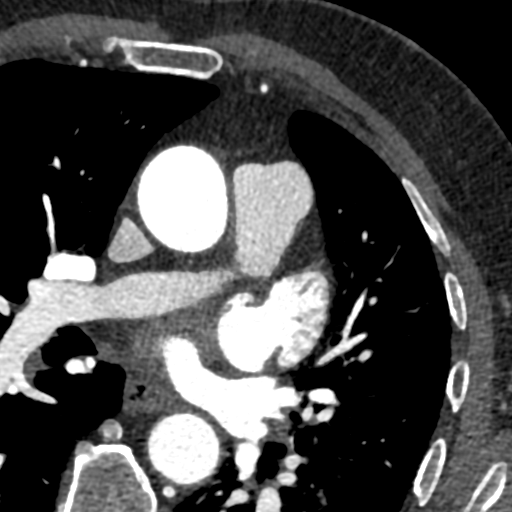

[Series 8: ts diast sharp 75 % · axial · 0.39mm/px · z∈[+161,+208]mm · 2 of 358 slices shown]
[im 120/358  lung]
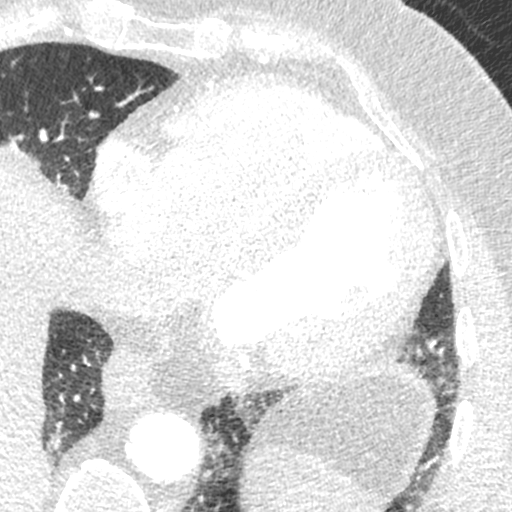
[im 239/358  lung]
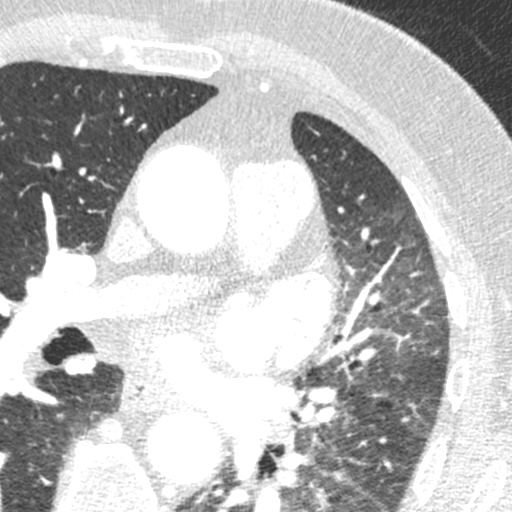

[Series 9: ts syst sharp 75 % · axial · 0.39mm/px · z∈[+161,+208]mm · 2 of 358 slices shown]
[im 120/358  lung]
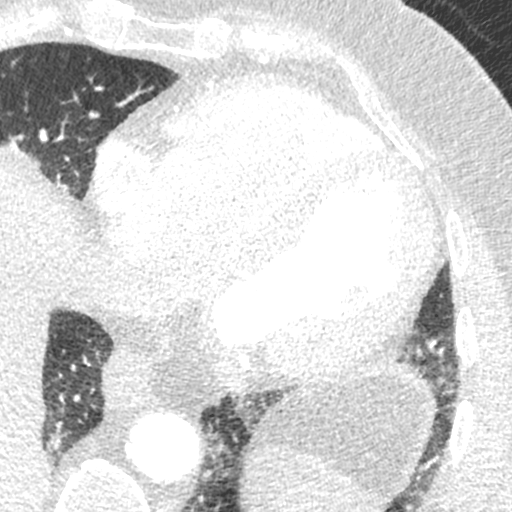
[im 239/358  lung]
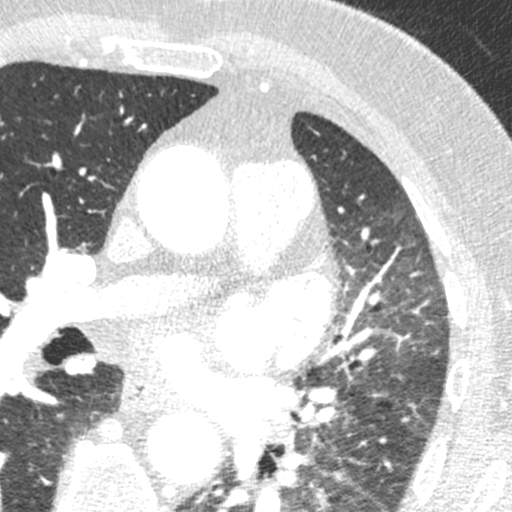

[8 of 20 positions shown; findings below may reference images not displayed]

FINDINGS: Vascular: heart is normal size. Aorta is normal caliber. Scattered
calcifications in the aortic root and aortic arch as well as
descending thoracic aorta.

Mediastinum/Nodes: No adenopathy in the lower mediastinum or hila.
Small hiatal hernia.

Lungs/Pleura: Bibasilar atelectasis. No confluent opacities or
effusions.

Upper Abdomen: Imaging into the upper abdomen shows no acute
findings.

Musculoskeletal: Chest wall soft tissues are unremarkable. No acute
bony abnormality.
IMPRESSION: Aortic atherosclerosis.

Small hiatal hernia.

Bibasilar atelectasis.

EXAM:
Cardiac/Coronary  CT
FINDINGS: A 120 kV prospective scan was triggered in the descending thoracic
aorta at 111 HU's. Axial non-contrast 3 mm slices were carried out
through the heart. The data set was analyzed on a dedicated work
station and scored using the Agatson method. Gantry rotation speed
was 250 msecs and collimation was .6 mm. No beta blockade and 0.8 mg
of sl NTG was given. The 3D data set was reconstructed in 5%
intervals of the 67-82 % of the R-R cycle. Diastolic phases were
analyzed on a dedicated work station using MPR, MIP and VRT modes.
The patient received 80 cc of contrast.

Aorta:  Normal size.  No calcifications.  No dissection.

Aortic Valve:  Trileaflet.  No calcifications.

Coronary Arteries:  Normal coronary origin.  Right dominance.

RCA is a large dominant artery that gives rise to PDA and PLVB.
There is no plaque.

Left main is a large artery that gives rise to LAD and LCX arteries.

LAD is a large vessel that has no plaque.

LCX is a non-dominant artery that gives rise to one large OM1
branch. There is no plaque.

Other findings:

Normal pulmonary vein drainage into the left atrium.

Normal let atrial appendage without a thrombus.

Normal size of the pulmonary artery.
IMPRESSION: 1. Coronary calcium score of 0. This was 0 percentile for age and
sex matched control.

2. Normal coronary origin with right dominance.

3. No evidence of CAD.

Klpigbb Moolman

EXAM:
Cardiac/Coronary  CT
FINDINGS: A 120 kV prospective scan was triggered in the descending thoracic
aorta at 111 HU's. Axial non-contrast 3 mm slices were carried out
through the heart. The data set was analyzed on a dedicated work
station and scored using the Agatson method. Gantry rotation speed
was 250 msecs and collimation was .6 mm. No beta blockade and 0.8 mg
of sl NTG was given. The 3D data set was reconstructed in 5%
intervals of the 67-82 % of the R-R cycle. Diastolic phases were
analyzed on a dedicated work station using MPR, MIP and VRT modes.
The patient received 80 cc of contrast.

Aorta: Normal size. Calcifications of the aortic root and ascending
aorta. No dissection.

Aortic Valve:  Trileaflet.  Moderate calcifications.

Coronary Arteries:  Normal coronary origin.  Left dominance.

RCA is a small non dominant artery that gives rise to PDA and PLVB.
There is mild calcified plaque in the proximal RCA with associated
stenosis of 25-49%. There is motion artifact in the mid and distal
RCA.

Left main is a large artery that gives rise to LAD and LCX arteries.
There is moderate calcified plaque in the mid to distal LM with
associated 50-69% stenosis.

LAD is a large vessel that gives rise to a large D1 and moderate
sized D2. There is moderate calcified plaque in the proximal LAD
with associated 50-69% stenosis. There is severe calcified plaque in
the mid LAD with associated stenosis of 70-99%.

LCX is a large dominant artery that gives rise to one large OM1
branch. There is minimal calcified plaque in the proximal to mid LCx
with associated stenosis of 0-24%. There is mild calcified plaque in
the mid LCx at the takeoff of a moderate sized branching OM with
associated stenosis of 25-49%. There is minimal atherosclerosis of
the distal LCx with associated stenosis of 0 - 24%.

Other findings:

Normal pulmonary vein drainage into the left atrium.

Normal let atrial appendage without a thrombus.

Normal size of the pulmonary artery.
IMPRESSION: 1. Coronary calcium score of 5545. This was 66th percentile for age
and sex matched control.

2.  Normal coronary origin with left dominance.

3.  Moderate to severe calcified AV cusps.

4.  Atherosclerosis of the aortic root and ascending aorta.

5. Moderate Calcified plaque in the distal LM and severe calcified
plaque in the mid LAD. CAD-RADS 4b. Significant blooming may over
estimate LAD stenosis.

6.  Recommend aggressive risk factor modification.

7.  Recommend cardiac catheterization.

8.  Study has been sent for FFR analysis.

Klpigbb Moolman

*** End of Addendum ***
EXAM:
OVER-READ INTERPRETATION  CT CHEST

The following report is an over-read performed by radiologist Dr.
Kaki Jim [REDACTED] on 03/07/2019. This
over-read does not include interpretation of cardiac or coronary
anatomy or pathology. The coronary CTA Interpretation by the
cardiologist is attached.
FINDINGS: Vascular: heart is normal size. Aorta is normal caliber. Scattered
calcifications in the aortic root and aortic arch as well as
descending thoracic aorta.

Mediastinum/Nodes: No adenopathy in the lower mediastinum or hila.
Small hiatal hernia.

Lungs/Pleura: Bibasilar atelectasis. No confluent opacities or
effusions.

Upper Abdomen: Imaging into the upper abdomen shows no acute
findings.

Musculoskeletal: Chest wall soft tissues are unremarkable. No acute
bony abnormality.
IMPRESSION: Aortic atherosclerosis.

Small hiatal hernia.

Bibasilar atelectasis.

## 2019-03-07 MED ORDER — IOHEXOL 350 MG/ML SOLN
80.0000 mL | Freq: Once | INTRAVENOUS | Status: AC | PRN
  Administered 2019-03-07: 80 mL via INTRAVENOUS

## 2019-03-07 MED ORDER — METOPROLOL TARTRATE 5 MG/5ML IV SOLN
INTRAVENOUS | Status: AC
  Administered 2019-03-07: 16:00:00 2.5 mg
  Filled 2019-03-07: qty 5

## 2019-03-07 MED ORDER — NITROGLYCERIN 0.4 MG SL SUBL
0.8000 mg | SUBLINGUAL_TABLET | Freq: Once | SUBLINGUAL | Status: AC
  Administered 2019-03-07: 16:00:00 0.8 mg via SUBLINGUAL
  Filled 2019-03-07: qty 25

## 2019-03-07 MED ORDER — NITROGLYCERIN 0.4 MG SL SUBL
SUBLINGUAL_TABLET | SUBLINGUAL | Status: AC
  Filled 2019-03-07: qty 2

## 2019-03-08 ENCOUNTER — Ambulatory Visit (HOSPITAL_COMMUNITY)
Admission: RE | Admit: 2019-03-08 | Discharge: 2019-03-08 | Disposition: A | Discharge status: Home / Self Care | Payer: Medicare Other | Source: Ambulatory Visit | Attending: Cardiovascular Disease | Admitting: Cardiovascular Disease

## 2019-03-08 DIAGNOSIS — R55 Syncope and collapse: Secondary | ICD-10-CM | POA: Diagnosis not present

## 2019-03-08 DIAGNOSIS — I350 Nonrheumatic aortic (valve) stenosis: Secondary | ICD-10-CM

## 2019-03-08 DIAGNOSIS — I452 Bifascicular block: Secondary | ICD-10-CM | POA: Diagnosis not present

## 2019-03-08 DIAGNOSIS — I2510 Atherosclerotic heart disease of native coronary artery without angina pectoris: Secondary | ICD-10-CM

## 2019-03-08 DIAGNOSIS — I2584 Coronary atherosclerosis due to calcified coronary lesion: Secondary | ICD-10-CM | POA: Insufficient documentation

## 2019-03-08 DIAGNOSIS — E7800 Pure hypercholesterolemia, unspecified: Secondary | ICD-10-CM | POA: Diagnosis not present

## 2019-03-09 DIAGNOSIS — R310 Gross hematuria: Secondary | ICD-10-CM | POA: Diagnosis not present

## 2019-03-10 DIAGNOSIS — Z87442 Personal history of urinary calculi: Secondary | ICD-10-CM | POA: Diagnosis not present

## 2019-03-10 DIAGNOSIS — Z23 Encounter for immunization: Secondary | ICD-10-CM | POA: Diagnosis not present

## 2019-03-10 DIAGNOSIS — R310 Gross hematuria: Secondary | ICD-10-CM | POA: Diagnosis not present

## 2019-03-11 DIAGNOSIS — N210 Calculus in bladder: Secondary | ICD-10-CM | POA: Diagnosis not present

## 2019-03-11 DIAGNOSIS — N281 Cyst of kidney, acquired: Secondary | ICD-10-CM | POA: Diagnosis not present

## 2019-03-11 DIAGNOSIS — N200 Calculus of kidney: Secondary | ICD-10-CM | POA: Diagnosis not present

## 2019-03-15 DIAGNOSIS — N281 Cyst of kidney, acquired: Secondary | ICD-10-CM | POA: Diagnosis not present

## 2019-03-15 DIAGNOSIS — N210 Calculus in bladder: Secondary | ICD-10-CM | POA: Diagnosis not present

## 2019-03-16 DIAGNOSIS — L821 Other seborrheic keratosis: Secondary | ICD-10-CM | POA: Diagnosis not present

## 2019-03-16 DIAGNOSIS — D1801 Hemangioma of skin and subcutaneous tissue: Secondary | ICD-10-CM | POA: Diagnosis not present

## 2019-03-16 DIAGNOSIS — Z85828 Personal history of other malignant neoplasm of skin: Secondary | ICD-10-CM | POA: Diagnosis not present

## 2019-03-18 DIAGNOSIS — N452 Orchitis: Secondary | ICD-10-CM | POA: Diagnosis not present

## 2019-03-21 DIAGNOSIS — Z20828 Contact with and (suspected) exposure to other viral communicable diseases: Secondary | ICD-10-CM | POA: Diagnosis not present

## 2019-03-21 DIAGNOSIS — Z03818 Encounter for observation for suspected exposure to other biological agents ruled out: Secondary | ICD-10-CM | POA: Diagnosis not present

## 2019-03-22 ENCOUNTER — Other Ambulatory Visit: Payer: Self-pay | Admitting: Cardiovascular Disease

## 2019-03-22 DIAGNOSIS — Z20828 Contact with and (suspected) exposure to other viral communicable diseases: Secondary | ICD-10-CM | POA: Diagnosis not present

## 2019-03-22 DIAGNOSIS — Z1159 Encounter for screening for other viral diseases: Secondary | ICD-10-CM | POA: Diagnosis not present

## 2019-03-24 DIAGNOSIS — N3000 Acute cystitis without hematuria: Secondary | ICD-10-CM | POA: Diagnosis not present

## 2019-03-24 DIAGNOSIS — N452 Orchitis: Secondary | ICD-10-CM | POA: Diagnosis not present

## 2019-03-24 DIAGNOSIS — R310 Gross hematuria: Secondary | ICD-10-CM | POA: Diagnosis not present

## 2019-03-29 ENCOUNTER — Other Ambulatory Visit: Payer: Self-pay | Admitting: Cardiovascular Disease

## 2019-03-29 DIAGNOSIS — Z125 Encounter for screening for malignant neoplasm of prostate: Secondary | ICD-10-CM | POA: Diagnosis not present

## 2019-04-05 ENCOUNTER — Ambulatory Visit: Payer: Medicare Other | Admitting: Family Medicine

## 2019-04-05 DIAGNOSIS — E7800 Pure hypercholesterolemia, unspecified: Secondary | ICD-10-CM | POA: Diagnosis not present

## 2019-04-05 DIAGNOSIS — N451 Epididymitis: Secondary | ICD-10-CM | POA: Diagnosis not present

## 2019-04-05 DIAGNOSIS — I450 Right fascicular block: Secondary | ICD-10-CM | POA: Diagnosis not present

## 2019-04-05 DIAGNOSIS — N210 Calculus in bladder: Secondary | ICD-10-CM | POA: Diagnosis not present

## 2019-04-05 DIAGNOSIS — I350 Nonrheumatic aortic (valve) stenosis: Secondary | ICD-10-CM | POA: Diagnosis not present

## 2019-04-05 DIAGNOSIS — S0240ED Zygomatic fracture, right side, subsequent encounter for fracture with routine healing: Secondary | ICD-10-CM | POA: Diagnosis not present

## 2019-04-05 DIAGNOSIS — Z0000 Encounter for general adult medical examination without abnormal findings: Secondary | ICD-10-CM | POA: Diagnosis not present

## 2019-04-05 DIAGNOSIS — N401 Enlarged prostate with lower urinary tract symptoms: Secondary | ICD-10-CM | POA: Diagnosis not present

## 2019-04-05 DIAGNOSIS — R7302 Impaired glucose tolerance (oral): Secondary | ICD-10-CM | POA: Diagnosis not present

## 2019-04-08 ENCOUNTER — Ambulatory Visit (INDEPENDENT_AMBULATORY_CARE_PROVIDER_SITE_OTHER): Payer: Medicare Other | Admitting: Cardiovascular Disease

## 2019-04-08 ENCOUNTER — Other Ambulatory Visit: Payer: Self-pay

## 2019-04-08 ENCOUNTER — Encounter: Payer: Self-pay | Admitting: Cardiovascular Disease

## 2019-04-08 DIAGNOSIS — Z862 Personal history of diseases of the blood and blood-forming organs and certain disorders involving the immune mechanism: Secondary | ICD-10-CM

## 2019-04-08 DIAGNOSIS — I350 Nonrheumatic aortic (valve) stenosis: Secondary | ICD-10-CM | POA: Diagnosis not present

## 2019-04-08 DIAGNOSIS — E7800 Pure hypercholesterolemia, unspecified: Secondary | ICD-10-CM

## 2019-04-08 DIAGNOSIS — I452 Bifascicular block: Secondary | ICD-10-CM | POA: Diagnosis not present

## 2019-04-08 DIAGNOSIS — R55 Syncope and collapse: Secondary | ICD-10-CM | POA: Diagnosis not present

## 2019-04-08 DIAGNOSIS — I471 Supraventricular tachycardia: Secondary | ICD-10-CM | POA: Diagnosis not present

## 2019-04-08 DIAGNOSIS — I2510 Atherosclerotic heart disease of native coronary artery without angina pectoris: Secondary | ICD-10-CM

## 2019-04-08 DIAGNOSIS — I2584 Coronary atherosclerosis due to calcified coronary lesion: Secondary | ICD-10-CM | POA: Diagnosis not present

## 2019-04-08 NOTE — Progress Notes (Signed)
Patient ID: [name], male   DOB: 1933/03/09, 85 y.o.   MRN: [id number]    Primary MD: Dr. Derinda Late  HPI: Eugene [name] is a 85 y.o. male who presents to the office today for a cardiology evaluation following a recent syncopal spell which occurred while he was riding a bike.  Eugene [name] has a remote history of sarcoidosis and had been followed at [facility] with complete remission. He has a history of mild hyperlipidemia and has been on Vytorin 10/40. He also has a history of BPH on Proscar.  An echo Doppler study which was done to evaluate a systolic murmur in the aortic region noted in December 2013 showed normal systolic function with mild focal basal hypertrophy of the septum. He had normal systolic function. There was evidence for a moderate diffuse calcification involving the noncoronary cusp of a trileaflet aortic valve. He did have sclerosis without stenosis. Valve area was 2.55 cm. RV size was upper normal to mildly dilated. A nuclear perfusion study done in 2013 was unchanged from 6 years previously and continued to show normal perfusion.  Laboratory done by Dr. Deforest Hoyles in August 2015 was normal with a hemoglobin of 14.5, hematocrit 43.8.  Renal function was excellent with a BUN of 19, Cr 0.99.  Fasting glucose was 78.  Lipid studies remained excellent with a total cholesterol 125, triglycerides 14, HDL cholesterol 44, and LDL cholesterol at 60.  Angiotensin converting enzyme was normal at 37 and last year was 33.  He underwent an echo Doppler study in 03/13/2015.  This revealed an ejection fraction at 60-65%. There was grade 1 diastolic dysfunction.  He had normal LV filling pressures.  His aortic valve was calcified and there was evidence for mild aortic stenosis with a mean gradient of 9, peak gradient of 18, and valve area of 1.87 cm and mild aortic insufficiency.  There was mild mitral annular calcification with trivial MR , mild LA dilatation, and  moderate TR with PA pressure 30 mm. He underwent right knee replacement surgery by Dr. Moshe Salisbury in November.  Postoperatively had some swelling and lower extremity venous studies were negative.  When I saw him in 2017, he was doing well from a cardiac standpoint.   Specifically, he denied any episodes of chest pain.  He was able to play tennis again following his knee surgery.  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  He had also undergone follow-up CT imaging asked and was found to have a new irregularly-shaped nodular density in the left lower lobe, alt to represent a possible focus of active infection/inflammation.  A follow-up CT was recommended.  After a course of antimicrobial therapy.  His CT also demonstrated a stable ectatic.  Borderline aneurysmal ascending thoracic aorta at 3.9 cm.  He had evidence for coronary calcification.   Laboratory in 2017 by his primary physician showed total cholesterol was 134, triglycerides 79, HDL 43, and LDL 75 on his current dose of ezetimibe 10 mg and simvastatin 40 mg.   When I saw him in March 2019 he was remaining active and was walking on elliptical machine at least 3-4 times per week and was playing tennis 2 times per week.  He denied any chest pain or shortness of breath.  He denies palpitations.   In 2019, he was found  to have a pulmonary infiltrate for which she was evaluated by Dr. Elsworth Soho.  He had undergone bronchoscopy with biopsy and ultimately underwent an evaluation at [facility] and this nodule had not significantly changed over the past 16 years.  I last evaluated him in May 2020 and a telemedicine visit.  At that time he denied any chest pain, palpitations, presyncope or syncope.  He denied any PND orthopnea.  He had previously been followed by Dr. Ardeth Perfect but he had recently signed up for concierge  medicine with Dr. Derinda Late.  During that evaluation I recommended he undergo a follow-up echo Doppler study to reassess his aortic valve since in 2016 he was found to have mild aortic stenosis.  He underwent an echo Doppler study on December 21, 2018 which continues to show normal systolic function with EF at 60 to 65%, and moderate asymmetric left ventricular hypertrophy.  There was grade 2 diastolic dysfunction.  There was mild aortic stenosis with moderate calcification of the aortic valve.  He had a mean systolic gradient of 11 mm and his peak gradient was 20.8 mm.  Calculated aortic valve was 1.7 cm and was consistent with mild aortic stenosis.  Eugene [name] has continued to be active.  On January 25, 2019 while biking at [place] he apparently had a syncopal spell.  He was found by bystanders.  Apparently he is amnesic to the event and when he woke up he was at [facility] in the emergency room.  It did not appear that he was aware when he had fallen since there was no apparent marks on his hands to break a fall.  A CT scan of the maxillofacial area demonstrated a comminuted displaced bilateral nasal bone fracture as well as fracture of the right zygomatic arch.  There was a frontal scalp hematoma.  He had extensive CT imaging of his chest abdomen and pelvis.  Of note, on his chest CT he was felt to have severe coronary artery atherosclerotic calcifications.  There was no evidence for acute traumatic injury within the chest abdomen or pelvis.  He had a 3.6 cm indeterminate liver mass, 4.3 cm indeterminate left renal lesion and had multiple bladder stones and bladder diverticula likely related to chronic outlet obstruction from an enlarged prostate and had nonobstructive bilateral nephrolithiasis.  Upon his return to [facility], he was evaluated by Eugene [name] in the [place] primary care.  He subsequently was evaluated by Eugene [name] in our office for cardiology evaluation  during my absence.  With his memory absence, it is felt that he had a concussion.  When seen by Eugene [name], she reviewed his extensive evaluation and recent echo which confirmed that he did not have significant aortic stenosis.  Because of his remote history of sarcoidosis, she scheduled him to undergo cardiac MRI imaging to assess for scar/infiltrative disease.  He was also scheduled to undergo 14-day Ziotelemetry monitor.    I saw him in follow-up of Eugene [name] on February 14, 2019.  At the time of his evaluation he had not yet received his Zio patch monitor.  I reviewed extensively his records from [facility] as well as the records of Eugene [name].  His MR scan was reviewed and did not show any definitive myocardial late gadolinium enhancement so there was no definitive evidence for prior myocardial infarction, infiltrative disease or myocarditis.  LVEF was approximately 60%.  His RV EF was reported at 35% however when reviewed by Eugene [name] she had  felt in 20 function was somewhat better.  I scheduled him to undergo coronary CTA to assess potential CAD disease with silent ischemia leading to his clinical event.  He also underwent carotid duplex imaging.Marland Kitchen  His 14-day ZIO monitor showed predominant sinus rhythm with right bundle branch block.  He did have an average heart rate at 62 bpm with a minimum of 42 while sleeping and maximal heart rate of 190.  He had several short bursts of SVT the fastest interval lasting 4 beats with a maximum rate of 190.  The longest SVT episode was 12.8 seconds with an average heart rate of 138 bpm.  There was no VT or atrial fibrillation or high degree block or pauses noted.  Carotid duplex imaging essentially normal with normal antegrade vertebral and subclavian flow and was without evidence for internal carotid stenoses.  I received an initial report of his CT angiogram which came back as normal.  We had notified him of the results.  Apparently,  there was an addendum to the report port when it was reinterpreted adjusted multivessel CAD with a calcium score of 1151 with moderate calcific plaque in the mid distal left main, severe calcific plaque in the mid LAD mild calcific plaque in the circumflex and RCA.  There was concern of possible significant blooming artifact which may have contributed to overestimate of LAD stenoses.  Subsequent FFR analysis was normal and did not reveal any hemodynamic significant stenoses.  He presents for evaluation.  Past Medical History:  Diagnosis Date   Bladder stones    BPH (benign prostatic hyperplasia)    Hernia of abdominal cavity    History of kidney stones    Hyperlipidemia    Incomplete right bundle branch block    Low O2 saturation 04/04/2015   Wife says O2 sats are in the low 90s base line.  Now in the 34s.  Will do incentive spirometry   Primary localized osteoarthritis of right knee 04/02/2015   Sarcoidosis    diagnosed 8-9 yrs ago   Upper GI bleed 04/04/2015   Patient had coffee ground emesis that was Guiac positive on Monday.  EGD scheduled for 04/04/2015. Started on IV Protonix    Past Surgical History:  Procedure Laterality Date   ANTERIOR CERVICAL DECOMP/DISCECTOMY FUSION  2007   BRONCHOSCOPY  2007   CARDIOVASCULAR STRESS TEST  04/29/2012   normal nuclear stress study.    CYSTOSCOPY  2004   DOPPLER ECHOCARDIOGRAPHY  04/29/2012   EF not noted. small perimembranous ventricular septal defect. small left to right ventricular shunt. moderate diffuse calcification involving noncoronary cusp of the aortic valve.    ESOPHAGOGASTRODUODENOSCOPY  04/04/2015   ESOPHAGOGASTRODUODENOSCOPY N/A 04/04/2015   Procedure: ESOPHAGOGASTRODUODENOSCOPY (EGD);  Surgeon: Manus Gunning, MD;  Location: [place];  Service: Gastroenterology;  Laterality: N/A;   INGUINAL HERNIA REPAIR Right 2009   TOTAL KNEE ARTHROPLASTY Right 04/02/2015   Procedure: TOTAL KNEE ARTHROPLASTY;  Surgeon:  Elsie Saas, MD;  Location: [place];  Service: Orthopedics;  Laterality: Right;   VIDEO BRONCHOSCOPY Bilateral 09/23/2017   Procedure: VIDEO BRONCHOSCOPY WITH FLUORO;  Surgeon: Rigoberto Noel, MD;  Location: [facility];  Service: Cardiopulmonary;  Laterality: Bilateral;    No Known Allergies  Current Outpatient Medications  Medication Sig Dispense Refill   ezetimibe (ZETIA) 10 MG tablet TAKE (1) TABLET DAILY AT BEDTIME. 90 tablet 3   finasteride (PROSCAR) 5 MG tablet Take 5 mg by mouth every evening.      simvastatin (ZOCOR) 40 MG  tablet TAKE (1) TABLET DAILY AT BEDTIME. 90 tablet 0   aspirin EC 81 MG tablet Take 1 tablet (81 mg total) by mouth daily. 90 tablet 3   metoprolol succinate (TOPROL XL) 25 MG 24 hr tablet Take 0.5 tablets (12.5 mg total) by mouth daily. 45 tablet 6   No current facility-administered medications for this visit.     Social History   Socioeconomic History   Marital status: Married    Spouse name: Not on file   Number of children: Not on file   Years of education: Not on file   Highest education level: Not on file  Occupational History   Not on file  Social Needs   Financial resource strain: Not on file   Food insecurity    Worry: Not on file    Inability: Not on file   Transportation needs    Medical: Not on file    Non-medical: Not on file  Tobacco Use   Smoking status: Former Smoker    Types: Pipe    Quit date: 05/26/1972    Years since quitting: 46.9   Smokeless tobacco: Never Used   Tobacco comment: Quit 43 year  Substance and Sexual Activity   Alcohol use: Yes    Alcohol/week: 4.0 standard drinks    Types: 4 Standard drinks or equivalent per week   Drug use: No   Sexual activity: Not on file  Lifestyle   Physical activity    Days per week: Not on file    Minutes per session: Not on file   Stress: Not on file  Relationships   Social connections    Talks on phone: Not on file    Gets together: Not on file     Attends religious service: Not on file    Active member of club or organization: Not on file    Attends meetings of clubs or organizations: Not on file    Relationship status: Not on file   Intimate partner violence    Fear of current or ex partner: Not on file    Emotionally abused: Not on file    Physically abused: Not on file    Forced sexual activity: Not on file  Other Topics Concern   Not on file  Social History Narrative   Not on file   Socially he is married. He is regional and 9 grandchildren. One son lives in [place], or lives in [place]. There is no tobacco use. He does take occasional alcohol. He does exercise.  Family History  Problem Relation Age of Onset   Ovarian cancer Mother    Heart attack Father 100    ROS General: Negative; No fevers, chills, or night sweats;  HEENT: Negative; No changes in vision or hearing, sinus congestion, difficulty swallowing Pulmonary: Negative; No cough, wheezing, shortness of breath, hemoptysis Cardiovascular: Negative; No chest pain, presyncope, syncope, palpitations GI: Negative; No nausea, vomiting, diarrhea, or abdominal pain GU: Negative; No dysuria, hematuria, or difficulty voiding Musculoskeletal: Recent bilateral nasal bone fractures and fracture of right zygomatic arch Hematologic/Oncology: Negative; no easy bruising, bleeding Remote history of sarcoidosis, completely resolved Endocrine: Negative; no heat/cold intolerance; no diabetes Neuro: Amnesic to his syncopal spell Skin: Negative; No rashes or skin lesions Psychiatric: Negative; No behavioral problems, depression Sleep: Negative; No snoring, daytime sleepiness, hypersomnolence, bruxism, restless legs, hypnogognic hallucinations, no cataplexy Other comprehensive 14 point system review is negative.   PE BP (!) 142/73    Pulse 65    Ht 5'  9" (1.753 m)    Wt 192 lb 3.2 oz (87.2 kg)    SpO2 96%    BMI 28.38 kg/m    Repeat blood pressure by me was 126/74 supine  and 122/72 standing  Wt Readings from Last 3 Encounters:  04/08/19 192 lb 3.2 oz (87.2 kg)  02/18/19 199 lb (90.3 kg)  02/14/19 195 lb (88.5 kg)   General: Alert, oriented, no distress.  Skin: normal turgor, no rashes, warm and dry HEENT: Normocephalic, atraumatic. Pupils equal round and reactive to light; sclera anicteric; extraocular muscles intact; recent facial trauma Nose:zygomatic arch fracture and bilateral nasal bone fractures Mouth/Parynx benign; Mallinpatti scale 2 Neck: No JVD, no carotid bruits; normal carotid upstroke Lungs: clear to ausculatation and percussion; no wheezing or rales Chest wall: without tenderness to palpitation Heart: PMI not displaced, RRR, s1 s2 normal, 2/6 early peaking systolic murmur in the aortic area, no diastolic murmur, no rubs, gallops, thrills, or heaves Abdomen: soft, nontender; no hepatosplenomehaly, BS+; abdominal aorta nontender and not dilated by palpation. Back: no CVA tenderness Pulses 2+ Musculoskeletal: full range of motion, normal strength, no joint deformities Extremities: no clubbing cyanosis or edema, Homan's sign negative  Neurologic: grossly nonfocal; Cranial nerves grossly wnl Psychologic: Normal mood and affect  ECG (independently read by me): Sinus rhythm at 65 bpm, left axis deviation, right bundle branch block with repolarization changes.  LVH by voltage.  QTc interval 453 ms.  PR interval 146 ms.  September 2020 ECG (independently read by me): Sinus bradycardia at 53 bpm, right bundle branch block with repolarization changes.  QRS duration 156 ms.  Mild LVH by voltage in aVL.  QTc interval 439 ms  March 2019 ECG (independently read by me): normal sinus rhythm at 65 bpm.  One isolated PVC.  Incomplete right bundle branch block.  Borderline LVH.  Normal intervals.  February 2018 ECG (independently read by me): Sinus bradycardia at 56 bpm.  Mild sinus arrhythmia.  Mild LVH.  Normal intervals.  No ST segment changes.  February  2017 ECG (independently read by me):  Normal sinus rhythm at 63 bpm.  Early transition.  No ST segment changes.  December 2015 ECG (independently read by me): Normal sinus rhythm at 66 bpm.  Mild RV conduction delay.  Early transition.  December 2014 ECG: Sinus rhythm with an occasional PVC; normal intervals.  LABS: I personally reviewed the laboratory from [place] done on 02/19/2016.  BUN 18, creatinine 0.9.  Ingrown 14.7, hematocrit 44.9.  Normal LFTs.  TSH 2.08.  April lipoprotein B 66.  Normal PSA.  Lipid studies as noted above.  Most recent lipid studies from March 03, 2017: Total cholesterol 134, HDL 39, LDL 70, triglycerides 124.    BMP Latest Ref Rng & Units 02/24/2019 01/26/2019 08/27/2017  Glucose 65 - 99 mg/dL 88 83 126(H)  BUN 8 - 27 mg/dL '21 22 21  '$ Creatinine 0.76 - 1.27 mg/dL 1.02 1.15 0.96  BUN/Creat Ratio 10 - 24 21 - -  Sodium 134 - 144 mmol/L 139 137 137  Potassium 3.5 - 5.2 mmol/L 4.8 4.2 4.3  Chloride 96 - 106 mmol/L 102 102 103  CO2 20 - 29 mmol/L '25 25 26  '$ Calcium 8.6 - 10.2 mg/dL 10.3(H) 9.7 10.1   Hepatic Function Latest Ref Rng & Units 03/20/2015 08/21/2014  Total Protein 6.5 - 8.1 g/dL 6.6 7.0  Albumin 3.5 - 5.0 g/dL 4.0 4.1  AST 15 - 41 U/L 29 32  ALT 17 - 63  U/L 30 32  Alk Phosphatase 38 - 126 U/L 68 73  Total Bilirubin 0.3 - 1.2 mg/dL 0.8 0.7   CBC Latest Ref Rng & Units 01/26/2019 04/06/2015 04/05/2015  WBC 4.0 - 10.5 K/uL 10.8(H) 11.3(H) 10.8(H)  Hemoglobin 13.0 - 17.0 g/dL 14.3 8.9(L) 8.7(L)  Hematocrit 39.0 - 52.0 % 43.9 27.1(L) 26.5(L)  Platelets 150 - 400 K/uL 176 152 131(L)   Lab Results  Component Value Date   MCV 92.8 01/26/2019   MCV 91.2 04/06/2015   MCV 92.7 04/05/2015   Additional studies reviewed: I reviewed all the records from [facility] including his CT images of January 25, 2019 following his recent syncopal spell induced  Trauma.  ECHO 7//28/2020 IMPRESSIONS  1. The left ventricle has normal  systolic function with an ejection fraction of 60-65%. The cavity size was normal. There is moderate asymmetric left ventricular hypertrophy. Left ventricular diastolic Doppler parameters are consistent with  pseudonormalization.  2. The right ventricle has normal systolic function. The cavity was normal. There is no increase in right ventricular wall thickness. Right ventricular systolic pressure could not be assessed.  3. Left atrial size was mildly dilated.  4. The aortic valve is abnormal. Moderate calcification of the aortic valve. Aortic valve regurgitation is trivial by color flow Doppler. Mild stenosis with a mean systolic gradient of 11 mmHg of the aortic valve.  5. The aorta is normal in size and structure when indexed to body surface area (ascending aorta measures 38 mm).  CARDIAC MRI 02/11/2019 IMPRESSION: 1. Normal left ventricular size with mild LV hypertrophy. This appears concentric and not asymmetric. EF 60%, normal wall motion.  2. The RV is mildly dilated with mild to moderate hypokinesis, EF 35%.  3. There is no definite myocardial LGE, so no definitive evidence for prior MI, infiltrative disease, or myocarditis.  Abnormal right ventricle. However, there is no definitive evidence for cardiac sarcoidosis or hypertrophic cardiomyoapthy.  ----------------------------------------------------------------------------------------------------------- CTA CORONARY :  IMPRESSION: 03/07/2019  1. Coronary calcium score of 0. This was 0 percentile for age and sex matched control.  2. Normal coronary origin with right dominance.  3. No evidence of CAD.  Fransico Him  ADDENDUM/ Edited Result :  CTA  Aorta: Normal size. Calcifications of the aortic root and ascending aorta. No dissection.  Aortic Valve:  Trileaflet.  Moderate calcifications.  Coronary Arteries:  Normal coronary origin.  Left dominance.  RCA is a small non dominant artery that gives rise to PDA and  PLVB. There is mild calcified plaque in the proximal RCA with associated stenosis of 25-49%. There is motion artifact in the mid and distal RCA.  Left main is a large artery that gives rise to LAD and LCX arteries. There is moderate calcified plaque in the mid to distal LM with associated 50-69% stenosis.  LAD is a large vessel that gives rise to a large D1 and moderate sized D2. There is moderate calcified plaque in the proximal LAD with associated 50-69% stenosis. There is severe calcified plaque in the mid LAD with associated stenosis of 70-99%.  LCX is a large dominant artery that gives rise to one large OM1 branch. There is minimal calcified plaque in the proximal to mid LCx with associated stenosis of 0-24%. There is mild calcified plaque in the mid LCx at the takeoff of a moderate sized branching OM with associated stenosis of 25-49%. There is minimal atherosclerosis of the distal LCx with associated stenosis of 0 - 24%.  Other findings:  Normal pulmonary vein drainage into the left atrium.  Normal let atrial appendage without a thrombus.  Normal size of the pulmonary artery.  IMPRESSION: 1. Coronary calcium score of 1151. This was 66th percentile for age and sex matched control.  2.  Normal coronary origin with left dominance.  3.  Moderate to severe calcified AV cusps.  4.  Atherosclerosis of the aortic root and ascending aorta.  5. Moderate Calcified plaque in the distal LM and severe calcified plaque in the mid LAD. CAD-RADS 4b. Significant blooming may over estimate LAD stenosis.  6.  Recommend aggressive risk factor modification.  7.  Recommend cardiac catheterization.  8.  Study has been sent for [facility] analysis.  Fransico Him  ----------------------------------------------------------------------------------------- FFR FINDINGS: FFRct analysis was performed on the original cardiac CT angiogram dataset. Diagrammatic representation of  the FFRct analysis is provided in a separate PDF document in PACS. This dictation was created using the PDF document and an interactive 3D model of the results. 3D model is not available in the EMR/PACS. Normal FFR range is >0.80.  1. No significant flow limiting lesion: LM FFR = 0.99.  2. LAD: No significant flow limiting lesion: Proximal FFR = 0.98, Mid FFR = 0.96, Distal FFR = 0.89.  3. LCX: No significant flow limiting lesion: Proximal FFR = 0.97, Mid FFR = 0.94, Distal FFR = 0.91.  4. RCA: No flow limiting lesion in non dominant small RCA: Proximal FFR = 1.00, Mid FFR = 0.97, distal FFR not obtained.  IMPRESSION: 1. CT FFR analysis show no hemodynamically significant flow limiting lesions.   IMPRESSION:  1. Syncope and collapse   2. Mild aortic stenosis   3. Calcification of coronary artery   4. RBBB (right bundle branch block with left anterior fascicular block)   5. Paroxysmal SVT (supraventricular tachycardia) (HCC)   6. Pure hypercholesterolemia   7. History of sarcoidosis     ASSESSMENT AND PLAN: Eugene [name] is an 85 year old gentleman who has a remote history of prior sarcoid disease with ultimate complete remisson.  He has a history of mild hyperlipidemia which has been aggressively controlled.  Remotely, he was found to have mild coronary calcification on CT imaging.  I have been aggressive with lipid therapy in attempt to potentially induce plaque regression and he has been on Zetia/simvastatin 10/40 with LDL cholesterols previously documented to be 70.  In 2016 he was found to have aortic stenosis which was mild with mild aortic insufficiency with a mean gradient of 9 and peak gradient of 18.  Recently, [place], he apparently had a syncopal spell.  He has no recollection of falling and when he awakened he was in [facility] emergency room.  It does not appear that he had any significant awareness of rhythm disturbance and apparently when  EMS arrived he must have had 6 stable cardiac rhythm.  In July 2020 I had obtained a follow-up echo Doppler study which still confirmed just mild aortic stenosis with a valve area of 1.7 cm.  It is unlikely that the severity of his aortic stenosis accounts for his syncope.  Recently, his ECG showed definite right bundle branch block which in the past had just been incomplete right bundle.  On his ECG of February 14, 2019 he was in sinus rhythm, although bradycardic at 53 with right bundle branch block.  QTc interval is normal.  That evaluation I was concerned potential ischemic etiology scheduled him to undergo a coronary CTA for  further evaluation of potential significant coronary obstructive disease.  It is possible he does not have any chest pain but may have been ischemic if he does have significant obstructive disease contributing to his syncopal spell.  I  also scheduling him for carotid duplex imaging to assess his carotid arteries and vertebral flow.   In the office today I had a long discussion with Eugene [name] as well as his daughter by telephone who lives in [place].  I reviewed his Zio patch findings demonstrated predominant sinus rhythm but revealed several episodes of supraventricular tachycardia, as well as episodes of isolated ectopic in isolated ventricular ectopy.  His fastest heartbeat was 190 bpm which lasted 4 beats and was consistent with SVT.  The longest SVT episode was 12.8 seconds with an average rate of 138 bpm.  At the time of my discussion with the patient, I was only aware of his initial CTA findings which initially was interpreted as normal had been sent to my inbox which I had responded.  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  FFR analysis, however, was entirely normal and did not reveal hemodynamic significance.  Subsequently I contacted the patient once I became aware of this addended report and again had a lengthy discussion with both the patient and also called his daughter in [place] and reviewed the findings with her in detail and discussed treatment strategy.  Based on the CT findings I have suggested the addition of metoprolol succinate initially at 12.5 mg for anti-ischemic benefit and suggested reinitiation of aspirin 81 mg.  I previously discussed further evaluation of his arrhythmia potential with a loop recorder.  I discussed probable definitive cardiac catheterization.    It is encouraging that his FFR analysis of all his areas of calcified plaque revealed no hemodynamic flow abnormalities.  When I  see him in the office, I will further discuss changing his lipid-lowering therapy for more aggressive treatment from Zetia and simvastatin 40 mg to rosuvastatin.  LDL cholesterol in October 2019 was 71. I will see the patient back in the office next week following initiation of metoprolol for further discussion concerning scheduling him for definitive cardiac catheterization.   Time spent: 40 minutes  Troy Sine, MD, [facility]  04/15/2019 12:18 PM

## 2019-04-08 NOTE — Patient Instructions (Signed)
Medication Instructions:  Your physician recommends that you continue on your current medications as directed. Please refer to the Current Medication list given to you today.  *If you need a refill on your cardiac medications before your next appointment, please call your pharmacy*  Lab Work: none If you have labs (blood work) drawn today and your tests are completely normal, you will receive your results only by: Marland Kitchen MyChart Message (if you have MyChart) OR . A paper copy in the mail If you have any lab test that is abnormal or we need to change your treatment, we will call you to review the results.  Testing/Procedures: Your physician has requested that you have an echocardiogram. Echocardiography is a painless test that uses sound waves to create images of your heart. It provides your doctor with information about the size and shape of your heart and how well your heart's chambers and valves are working. This procedure takes approximately one hour. There are no restrictions for this procedure. LOCATION: [facility]: [place], [place],  [address]  DUE February 2021    Follow-Up: At [facility], you and your health needs are our priority.  As part of our continuing mission to provide you with exceptional heart care, we have created designated Provider Care Teams.  These Care Teams include your primary Cardiologist (physician) and Advanced Practice Providers (APPs -  Physician Assistants and Nurse Practitioners) who all work together to provide you with the care you need, when you need it.  Your next appointment:   FEBRUARY 2021 (AFTER COMPLETING YOUR ECHOCARDIOGRAM)  The format for your next appointment:   In Person  Provider:   You may see DR. KELLY or one of the following Advanced Practice Providers on your designated Care Team:    Almyra Deforest, PA-C  Fabian Sharp, PA-C or   Roby Lofts, [place]   Other Instructions YOU  WILL BE CONTACTED BY OUR OFFICE FOR AN UPDATE ON WHETHER DR. KELLY WILL RECOMMEND PLACEMENT OF A LOOP RECORDER/MONITOR

## 2019-04-11 MED ORDER — ASPIRIN EC 81 MG PO TBEC: 81.0000 mg | DELAYED_RELEASE_TABLET | Freq: Every day | ORAL | 3 refills | Status: DC

## 2019-04-11 MED ORDER — METOPROLOL SUCCINATE ER 25 MG PO TB24: 12.5000 mg | ORAL_TABLET | Freq: Every day | ORAL | 6 refills | Status: DC

## 2019-04-12 DIAGNOSIS — N452 Orchitis: Secondary | ICD-10-CM | POA: Diagnosis not present

## 2019-04-12 DIAGNOSIS — N210 Calculus in bladder: Secondary | ICD-10-CM | POA: Diagnosis not present

## 2019-04-15 ENCOUNTER — Encounter: Payer: Self-pay | Admitting: Cardiovascular Disease

## 2019-04-18 ENCOUNTER — Ambulatory Visit (INDEPENDENT_AMBULATORY_CARE_PROVIDER_SITE_OTHER): Payer: Medicare Other | Admitting: Cardiovascular Disease

## 2019-04-18 ENCOUNTER — Other Ambulatory Visit: Payer: Self-pay

## 2019-04-18 ENCOUNTER — Encounter: Payer: Self-pay | Admitting: Cardiovascular Disease

## 2019-04-18 VITALS — BP 117/73 | HR 60 | Temp 97.9°F | Ht 69.0 in | Wt 190.0 lb

## 2019-04-18 DIAGNOSIS — I350 Nonrheumatic aortic (valve) stenosis: Secondary | ICD-10-CM | POA: Diagnosis not present

## 2019-04-18 DIAGNOSIS — R55 Syncope and collapse: Secondary | ICD-10-CM

## 2019-04-18 DIAGNOSIS — I2584 Coronary atherosclerosis due to calcified coronary lesion: Secondary | ICD-10-CM | POA: Diagnosis not present

## 2019-04-18 DIAGNOSIS — E785 Hyperlipidemia, unspecified: Secondary | ICD-10-CM | POA: Diagnosis not present

## 2019-04-18 DIAGNOSIS — R931 Abnormal findings on diagnostic imaging of heart and coronary circulation: Secondary | ICD-10-CM

## 2019-04-18 DIAGNOSIS — I2510 Atherosclerotic heart disease of native coronary artery without angina pectoris: Secondary | ICD-10-CM | POA: Diagnosis not present

## 2019-04-18 DIAGNOSIS — I452 Bifascicular block: Secondary | ICD-10-CM

## 2019-04-18 DIAGNOSIS — I471 Supraventricular tachycardia: Secondary | ICD-10-CM | POA: Diagnosis not present

## 2019-04-18 NOTE — H&P (View-Only) (Signed)
Patient ID: Eugene [name], male   DOB: 1933/03/09, 85 y.o.   MRN: [id number]    Primary MD: Dr. Derinda Late  HPI: Eugene [name] is a 85 y.o. male who presents to the office today for a cardiology evaluation following a recent syncopal spell which occurred while he was riding a bike.  Eugene [name] has a remote history of sarcoidosis and had been followed at [facility] with complete remission. He has a history of mild hyperlipidemia and has been on Vytorin 10/40. He also has a history of BPH on Proscar.  An echo Doppler study which was done to evaluate a systolic murmur in the aortic region noted in December 2013 showed normal systolic function with mild focal basal hypertrophy of the septum. He had normal systolic function. There was evidence for a moderate diffuse calcification involving the noncoronary cusp of a trileaflet aortic valve. He did have sclerosis without stenosis. Valve area was 2.55 cm. RV size was upper normal to mildly dilated. A nuclear perfusion study done in 2013 was unchanged from 6 years previously and continued to show normal perfusion.  Laboratory done by Dr. Deforest Hoyles in August 2015 was normal with a hemoglobin of 14.5, hematocrit 43.8.  Renal function was excellent with a BUN of 19, Cr 0.99.  Fasting glucose was 78.  Lipid studies remained excellent with a total cholesterol 125, triglycerides 14, HDL cholesterol 44, and LDL cholesterol at 60.  Angiotensin converting enzyme was normal at 37 and last year was 33.  He underwent an echo Doppler study in 03/13/2015.  This revealed an ejection fraction at 60-65%. There was grade 1 diastolic dysfunction.  He had normal LV filling pressures.  His aortic valve was calcified and there was evidence for mild aortic stenosis with a mean gradient of 9, peak gradient of 18, and valve area of 1.87 cm and mild aortic insufficiency.  There was mild mitral annular calcification with trivial MR , mild LA dilatation, and  moderate TR with PA pressure 30 mm. He underwent right knee replacement surgery by Dr. Moshe Salisbury in November.  Postoperatively had some swelling and lower extremity venous studies were negative.  When I saw him in 2017, he was doing well from a cardiac standpoint.   Specifically, he denied any episodes of chest pain.  He was able to play tennis again following his knee surgery.  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  He had also undergone follow-up CT imaging asked and was found to have a new irregularly-shaped nodular density in the left lower lobe, alt to represent a possible focus of active infection/inflammation.  A follow-up CT was recommended.  After a course of antimicrobial therapy.  His CT also demonstrated a stable ectatic.  Borderline aneurysmal ascending thoracic aorta at 3.9 cm.  He had evidence for coronary calcification.   Laboratory in 2017 by his primary physician showed total cholesterol was 134, triglycerides 79, HDL 43, and LDL 75 on his current dose of ezetimibe 10 mg and simvastatin 40 mg.   When I saw him in March 2019 he was remaining active and was walking on elliptical machine at least 3-4 times per week and was playing tennis 2 times per week.  He denied any chest pain or shortness of breath.  He denies palpitations.   In 2019, he was found  to have a pulmonary infiltrate for which she was evaluated by Dr. Elsworth Soho.  He had undergone bronchoscopy with biopsy and ultimately underwent an evaluation at [facility] and this nodule had not significantly changed over the past 16 years.  I last evaluated him in May 2020 and a telemedicine visit.  At that time he denied any chest pain, palpitations, presyncope or syncope.  He denied any PND orthopnea.  He had previously been followed by Dr. Ardeth Perfect but he had recently signed up for concierge  medicine with Dr. Derinda Late.  During that evaluation I recommended he undergo a follow-up echo Doppler study to reassess his aortic valve since in 2016 he was found to have mild aortic stenosis.  He underwent an echo Doppler study on December 21, 2018 which continues to show normal systolic function with EF at 60 to 65%, and moderate asymmetric left ventricular hypertrophy.  There was grade 2 diastolic dysfunction.  There was mild aortic stenosis with moderate calcification of the aortic valve.  He had a mean systolic gradient of 11 mm and his peak gradient was 20.8 mm.  Calculated aortic valve was 1.7 cm and was consistent with mild aortic stenosis.  Eugene [name] has continued to be active.  On January 25, 2019 while biking at [place] he apparently had a syncopal spell.  He was found by bystanders.  Apparently he is amnesic to the event and when he woke up he was at [facility] in the emergency room.  It did not appear that he was aware when he had fallen since there was no apparent marks on his hands to break a fall.  A CT scan of the maxillofacial area demonstrated a comminuted displaced bilateral nasal bone fracture as well as fracture of the right zygomatic arch.  There was a frontal scalp hematoma.  He had extensive CT imaging of his chest abdomen and pelvis.  Of note, on his chest CT he was felt to have severe coronary artery atherosclerotic calcifications.  There was no evidence for acute traumatic injury within the chest abdomen or pelvis.  He had a 3.6 cm indeterminate liver mass, 4.3 cm indeterminate left renal lesion and had multiple bladder stones and bladder diverticula likely related to chronic outlet obstruction from an enlarged prostate and had nonobstructive bilateral nephrolithiasis.  Upon his return to [facility], he was evaluated by Eugene [name] in the [place] primary care.  He subsequently was evaluated by Eugene [name] in our office for cardiology evaluation  during my absence.  With his memory absence, it is felt that he had a concussion.  When seen by Eugene [name], she reviewed his extensive evaluation and recent echo which confirmed that he did not have significant aortic stenosis.  Because of his remote history of sarcoidosis, she scheduled him to undergo cardiac MRI imaging to assess for scar/infiltrative disease.  He was also scheduled to undergo 14-day Ziotelemetry monitor.    I saw him in follow-up of Eugene [name] on February 14, 2019.  At the time of his evaluation he had not yet received his Zio patch monitor.  I reviewed extensively his records from [facility] as well as the records of Eugene [name].  His MR scan was reviewed and did not show any definitive myocardial late gadolinium enhancement so there was no definitive evidence for prior myocardial infarction, infiltrative disease or myocarditis.  LVEF was approximately 60%.  His RV EF was reported at 35% however when reviewed by Eugene [name] she had  felt in 20 function was somewhat better.  I scheduled him to undergo coronary CTA to assess potential CAD disease with silent ischemia leading to his clinical event.  He also underwent carotid duplex imaging.Marland Kitchen  His 14-day ZIO monitor showed predominant sinus rhythm with right bundle branch block.  He did have an average heart rate at 62 bpm with a minimum of 42 while sleeping and maximal heart rate of 190.  He had several short bursts of SVT the fastest interval lasting 4 beats with a maximum rate of 190.  The longest SVT episode was 12.8 seconds with an average heart rate of 138 bpm.  There was no VT or atrial fibrillation or high degree block or pauses noted.  Carotid duplex imaging essentially normal with normal antegrade vertebral and subclavian flow and was without evidence for internal carotid stenoses.  I received an initial report of his CT angiogram which came back as normal.  We had notified him of the results.  Apparently,  there was an addendum to the report port when it was reinterpreted adjusted multivessel CAD with a calcium score of 1151 with moderate calcific plaque in the mid distal left main, severe calcific plaque in the mid LAD mild calcific plaque in the circumflex and RCA.  There was concern of possible significant blooming artifact which may have contributed to overestimate of LAD stenoses.  Subsequent FFR analysis was normal and did not reveal any hemodynamic significant stenoses.   I had a long conversation with Eugene [name] as well as called his daughter in [place] in follow-up of his April 08, 2019 office visit.  At that time I recommended initiation of aspirin 81 mg and initiated metoprolol succinate 12.5 mg daily.  I also discussed changing him to more aggressive lipid-lowering therapy and he recently was started on rosuvastatin 40 mg in place of simvastatin.  Presently he denies chest pain.  His daughter arrived from [place] this weekend and he and his daughter are here in the office for follow-up evaluation and to discuss potential definitive cardiac catheterization.  Past Medical History:  Diagnosis Date   Bladder stones    BPH (benign prostatic hyperplasia)    Hernia of abdominal cavity    History of kidney stones    Hyperlipidemia    Incomplete right bundle branch block    Low O2 saturation 04/04/2015   Wife says O2 sats are in the low 90s base line.  Now in the 57s.  Will do incentive spirometry   Primary localized osteoarthritis of right knee 04/02/2015   Sarcoidosis    diagnosed 8-9 yrs ago   Upper GI bleed 04/04/2015   Patient had coffee ground emesis that was Guiac positive on Monday.  EGD scheduled for 04/04/2015. Started on IV Protonix    Past Surgical History:  Procedure Laterality Date   ANTERIOR CERVICAL DECOMP/DISCECTOMY FUSION  2007   BRONCHOSCOPY  2007   CARDIOVASCULAR STRESS TEST  04/29/2012   normal nuclear stress study.    CYSTOSCOPY  2004   DOPPLER  ECHOCARDIOGRAPHY  04/29/2012   EF not noted. small perimembranous ventricular septal defect. small left to right ventricular shunt. moderate diffuse calcification involving noncoronary cusp of the aortic valve.    ESOPHAGOGASTRODUODENOSCOPY  04/04/2015   ESOPHAGOGASTRODUODENOSCOPY N/A 04/04/2015   Procedure: ESOPHAGOGASTRODUODENOSCOPY (EGD);  Surgeon: Manus Gunning, MD;  Location: [facility];  Service: Gastroenterology;  Laterality: N/A;   INGUINAL HERNIA REPAIR Right 2009   TOTAL KNEE ARTHROPLASTY Right 04/02/2015   Procedure: TOTAL KNEE ARTHROPLASTY;  Surgeon: Elsie Saas, MD;  Location: [facility];  Service: Orthopedics;  Laterality: Right;   VIDEO BRONCHOSCOPY Bilateral 09/23/2017   Procedure: VIDEO BRONCHOSCOPY WITH FLUORO;  Surgeon: Rigoberto Noel, MD;  Location: [facility];  Service: Cardiopulmonary;  Laterality: Bilateral;    No Known Allergies  Current Outpatient Medications  Medication Sig Dispense Refill   aspirin EC 81 MG tablet Take 1 tablet (81 mg total) by mouth daily. 90 tablet 3   cholecalciferol (VITAMIN D3) 25 MCG (1000 UT) tablet Take 2,000 Units by mouth daily.     ezetimibe (ZETIA) 10 MG tablet TAKE (1) TABLET DAILY AT BEDTIME. 90 tablet 3   finasteride (PROSCAR) 5 MG tablet Take 5 mg by mouth every evening.      metoprolol succinate (TOPROL XL) 25 MG 24 hr tablet Take 0.5 tablets (12.5 mg total) by mouth daily. 45 tablet 6   simvastatin (ZOCOR) 40 MG tablet TAKE (1) TABLET DAILY AT BEDTIME. (Patient not taking: Reported on 04/18/2019) 90 tablet 0   No current facility-administered medications for this visit.     Social History   Socioeconomic History   Marital status: Married    Spouse name: Not on file   Number of children: Not on file   Years of education: Not on file   Highest education level: Not on file  Occupational History   Not on file  Social Needs   Financial resource strain: Not on file   Food insecurity    Worry: Not on  file    Inability: Not on file   Transportation needs    Medical: Not on file    Non-medical: Not on file  Tobacco Use   Smoking status: Former Smoker    Types: Pipe    Quit date: 05/26/1972    Years since quitting: 46.9   Smokeless tobacco: Never Used   Tobacco comment: Quit 43 year  Substance and Sexual Activity   Alcohol use: Yes    Alcohol/week: 4.0 standard drinks    Types: 4 Standard drinks or equivalent per week   Drug use: No   Sexual activity: Not on file  Lifestyle   Physical activity    Days per week: Not on file    Minutes per session: Not on file   Stress: Not on file  Relationships   Social connections    Talks on phone: Not on file    Gets together: Not on file    Attends religious service: Not on file    Active member of club or organization: Not on file    Attends meetings of clubs or organizations: Not on file    Relationship status: Not on file   Intimate partner violence    Fear of current or ex partner: Not on file    Emotionally abused: Not on file    Physically abused: Not on file    Forced sexual activity: Not on file  Other Topics Concern   Not on file  Social History Narrative   Not on file   Socially he is married. He is regional and 9 grandchildren. One son lives in [place], or lives in [place]. There is no tobacco use. He does take occasional alcohol. He does exercise.  Family History  Problem Relation Age of Onset   Ovarian cancer Mother    Heart attack Father 100    ROS General: Negative; No fevers, chills, or night sweats;  HEENT: Negative; No changes in vision or hearing, sinus congestion, difficulty swallowing Pulmonary:  Negative; No cough, wheezing, shortness of breath, hemoptysis Cardiovascular: Negative; No chest pain, presyncope, syncope, palpitations GI: Negative; No nausea, vomiting, diarrhea, or abdominal pain GU: Negative; No dysuria, hematuria, or difficulty voiding Musculoskeletal: Recent bilateral nasal  bone fractures and fracture of right zygomatic arch Hematologic/Oncology: Negative; no easy bruising, bleeding Remote history of sarcoidosis, completely resolved Endocrine: Negative; no heat/cold intolerance; no diabetes Neuro: Amnesic to his syncopal spell Skin: Negative; No rashes or skin lesions Psychiatric: Negative; No behavioral problems, depression Sleep: Negative; No snoring, daytime sleepiness, hypersomnolence, bruxism, restless legs, hypnogognic hallucinations, no cataplexy Other comprehensive 14 point system review is negative.   PE BP 117/73    Pulse 60    Temp 97.9 F (36.6 C)    Ht _0  (1.753 m)    Wt 190 lb (86.2 kg)    SpO2 97%    BMI 28.06 kg/m    Repeat blood pressure by me 130/74  Wt Readings from Last 3 Encounters:  04/18/19 190 lb (86.2 kg)  04/08/19 192 lb 3.2 oz (87.2 kg)  02/18/19 199 lb (90.3 kg)   General: Alert, oriented, no distress.  Skin: normal turgor, no rashes, warm and dry HEENT: Normocephalic, atraumatic. Pupils equal round and reactive to light; sclera anicteric; extraocular muscles intact;  Nose without nasal septal hypertrophy Mouth/Parynx benign; Mallinpatti scale 2 Neck: No JVD, no carotid bruits; normal carotid upstroke Lungs: clear to ausculatation and percussion; no wheezing or rales Chest wall: without tenderness to palpitation Heart: PMI not displaced, RRR, s1 s2 normal, 2/6 early peaking systolic murmur consistent with his mild aortic stenosis, no diastolic murmur, no rubs, gallops, thrills, or heaves Abdomen: soft, nontender; no hepatosplenomehaly, BS+; abdominal aorta nontender and not dilated by palpation. Back: no CVA tenderness Pulses 2+ Musculoskeletal: full range of motion, normal strength, no joint deformities Extremities: no clubbing cyanosis or edema, Homan's sign negative  Neurologic: grossly nonfocal; Cranial nerves grossly wnl Psychologic: Normal mood and affect   ECG (independently read by me): Sinus bradycardia at  54 bpm, right bundle branch block with repolarization changes.  Left axis deviation.  Borderline voltage criteria for LVH  April 08, 2019 ECG (independently read by me): Sinus rhythm at 65 bpm, left axis deviation, right bundle branch block with repolarization changes.  LVH by voltage.  QTc interval 453 ms.  PR interval 146 ms.  September 2020 ECG (independently read by me): Sinus bradycardia at 53 bpm, right bundle branch block with repolarization changes.  QRS duration 156 ms.  Mild LVH by voltage in aVL.  QTc interval 439 ms  March 2019 ECG (independently read by me): normal sinus rhythm at 65 bpm.  One isolated PVC.  Incomplete right bundle branch block.  Borderline LVH.  Normal intervals.  February 2018 ECG (independently read by me): Sinus bradycardia at 56 bpm.  Mild sinus arrhythmia.  Mild LVH.  Normal intervals.  No ST segment changes.  February 2017 ECG (independently read by me):  Normal sinus rhythm at 63 bpm.  Early transition.  No ST segment changes.  December 2015 ECG (independently read by me): Normal sinus rhythm at 66 bpm.  Mild RV conduction delay.  Early transition.  December 2014 ECG: Sinus rhythm with an occasional PVC; normal intervals.  LABS: I personally reviewed the laboratory from [place] done on 02/19/2016.  BUN 18, creatinine 0.9.  Ingrown 14.7, hematocrit 44.9.  Normal LFTs.  TSH 2.08.  April lipoprotein B 66.  Normal PSA.  Lipid studies as noted above.  Most  recent lipid studies from March 03, 2017: Total cholesterol 134, HDL 39, LDL 70, triglycerides 124.    BMP Latest Ref Rng & Units 02/24/2019 01/26/2019 08/27/2017  Glucose 65 - 99 mg/dL 88 83 126(H)  BUN 8 - 27 mg/dL _0 Creatinine 0.76 - 1.27 mg/dL 1.02 1.15 0.96  BUN/Creat Ratio 10 - 24 21 - -  Sodium 134 - 144 mmol/L 139 137 137  Potassium 3.5 - 5.2 mmol/L 4.8 4.2 4.3  Chloride 96 - 106 mmol/L 102 102 103  CO2 20 - 29 mmol/L _1 Calcium 8.6 - 10.2 mg/dL 10.3(H) 9.7  10.1   Hepatic Function Latest Ref Rng & Units 03/20/2015 08/21/2014  Total Protein 6.5 - 8.1 g/dL 6.6 7.0  Albumin 3.5 - 5.0 g/dL 4.0 4.1  AST 15 - 41 U/L 29 32  ALT 17 - 63 U/L 30 32  Alk Phosphatase 38 - 126 U/L 68 73  Total Bilirubin 0.3 - 1.2 mg/dL 0.8 0.7   CBC Latest Ref Rng & Units 01/26/2019 04/06/2015 04/05/2015  WBC 4.0 - 10.5 K/uL 10.8(H) 11.3(H) 10.8(H)  Hemoglobin 13.0 - 17.0 g/dL 14.3 8.9(L) 8.7(L)  Hematocrit 39.0 - 52.0 % 43.9 27.1(L) 26.5(L)  Platelets 150 - 400 K/uL 176 152 131(L)   Lab Results  Component Value Date   MCV 92.8 01/26/2019   MCV 91.2 04/06/2015   MCV 92.7 04/05/2015   Additional studies reviewed: I reviewed all the records from [facility] including his CT images of January 25, 2019 following his recent syncopal spell induced  Trauma.  ECHO 7//28/2020 IMPRESSIONS  1. The left ventricle has normal systolic function with an ejection fraction of 60-65%. The cavity size was normal. There is moderate asymmetric left ventricular hypertrophy. Left ventricular diastolic Doppler parameters are consistent with  pseudonormalization.  2. The right ventricle has normal systolic function. The cavity was normal. There is no increase in right ventricular wall thickness. Right ventricular systolic pressure could not be assessed.  3. Left atrial size was mildly dilated.  4. The aortic valve is abnormal. Moderate calcification of the aortic valve. Aortic valve regurgitation is trivial by color flow Doppler. Mild stenosis with a mean systolic gradient of 11 mmHg of the aortic valve.  5. The aorta is normal in size and structure when indexed to body surface area (ascending aorta measures 38 mm).  CARDIAC MRI 02/11/2019 IMPRESSION: 1. Normal left ventricular size with mild LV hypertrophy. This appears concentric and not asymmetric. EF 60%, normal wall motion.  2. The RV is mildly dilated with mild to moderate hypokinesis, EF 35%.  3. There is no  definite myocardial LGE, so no definitive evidence for prior MI, infiltrative disease, or myocarditis.  Abnormal right ventricle. However, there is no definitive evidence for cardiac sarcoidosis or hypertrophic cardiomyoapthy.  ----------------------------------------------------------------------------------------------------------- CTA CORONARY :  IMPRESSION: 03/07/2019  1. Coronary calcium score of 0. This was 0 percentile for age and sex matched control.  2. Normal coronary origin with right dominance.  3. No evidence of CAD.  Fransico Him  ADDENDUM/ Edited Result :  CTA  Aorta: Normal size. Calcifications of the aortic root and ascending aorta. No dissection.  Aortic Valve:  Trileaflet.  Moderate calcifications.  Coronary Arteries:  Normal coronary origin.  Left dominance.  RCA is a small non dominant artery that gives rise to PDA and PLVB. There is mild calcified plaque in the proximal RCA with associated stenosis of 25-49%. There is motion artifact in the  mid and distal RCA.  Left main is a large artery that gives rise to LAD and LCX arteries. There is moderate calcified plaque in the mid to distal LM with associated 50-69% stenosis.  LAD is a large vessel that gives rise to a large D1 and moderate sized D2. There is moderate calcified plaque in the proximal LAD with associated 50-69% stenosis. There is severe calcified plaque in the mid LAD with associated stenosis of 70-99%.  LCX is a large dominant artery that gives rise to one large OM1 branch. There is minimal calcified plaque in the proximal to mid LCx with associated stenosis of 0-24%. There is mild calcified plaque in the mid LCx at the takeoff of a moderate sized branching OM with associated stenosis of 25-49%. There is minimal atherosclerosis of the distal LCx with associated stenosis of 0 - 24%.  Other findings:  Normal pulmonary vein drainage into the left atrium.  Normal let atrial  appendage without a thrombus.  Normal size of the pulmonary artery.  IMPRESSION: 1. Coronary calcium score of 1151. This was 66th percentile for age and sex matched control.  2.  Normal coronary origin with left dominance.  3.  Moderate to severe calcified AV cusps.  4.  Atherosclerosis of the aortic root and ascending aorta.  5. Moderate Calcified plaque in the distal LM and severe calcified plaque in the mid LAD. CAD-RADS 4b. Significant blooming may over estimate LAD stenosis.  6.  Recommend aggressive risk factor modification.  7.  Recommend cardiac catheterization.  8.  Study has been sent for [facility] analysis.  Fransico Him  ----------------------------------------------------------------------------------------- FFR FINDINGS: FFRct analysis was performed on the original cardiac CT angiogram dataset. Diagrammatic representation of the FFRct analysis is provided in a separate PDF document in PACS. This dictation was created using the PDF document and an interactive 3D model of the results. 3D model is not available in the EMR/PACS. Normal FFR range is >0.80.  1. No significant flow limiting lesion: LM FFR = 0.99.  2. LAD: No significant flow limiting lesion: Proximal FFR = 0.98, Mid FFR = 0.96, Distal FFR = 0.89.  3. LCX: No significant flow limiting lesion: Proximal FFR = 0.97, Mid FFR = 0.94, Distal FFR = 0.91.  4. RCA: No flow limiting lesion in non dominant small RCA: Proximal FFR = 1.00, Mid FFR = 0.97, distal FFR not obtained.  IMPRESSION: 1. CT FFR analysis show no hemodynamically significant flow limiting lesions.   IMPRESSION:  1. Calcification of coronary artery   2. High coronary artery calcium score   3. Syncope and collapse   4. Paroxysmal SVT (supraventricular tachycardia) ([place])   5. Mild aortic stenosis   6. Hyperlipidemia with target LDL less than 70   7. RBBB (right bundle branch block with left anterior fascicular block)       ASSESSMENT AND PLAN: Eugene [name] is an 85 year old gentleman who has a remote history of prior sarcoid disease with ultimate complete remisson.  He has a history of mild hyperlipidemia which has been aggressively controlled.  Remotely, he was found to have mild coronary calcification on CT imaging.  I have been aggressive with lipid therapy in attempt to potentially induce plaque regression and he has been on Zetia/simvastatin 10/40 with LDL cholesterols previously documented to be 70.  In 2016 he was found to have aortic stenosis which was mild with mild aortic insufficiency with a mean gradient of 9 and peak gradient of 18.  Recently, [place], [place] apparently had a syncopal spell.  He has no recollection of falling and when he awakened he was in [facility] emergency room.  It does not appear that he had any significant awareness of rhythm disturbance and apparently when EMS arrived he must have had 6 stable cardiac rhythm.  In July 2020 I had obtained a follow-up echo Doppler study which still confirmed just mild aortic stenosis with a valve area of 1.7 cm.  It is unlikely that the severity of his aortic stenosis accounts for his syncope.  Recently, his ECG showed definite right bundle branch block which in the past had just been incomplete right bundle.  On his ECG of February 14, 2019 he was in sinus rhythm, although bradycardic at 53 with right bundle branch block.  QTc interval is normal.  That evaluation I was concerned potential ischemic etiology scheduled him to undergo a coronary CTA for further evaluation of potential significant coronary obstructive disease.  It is possible he does not have any chest pain but may have been ischemic if he does have significant obstructive disease contributing to his syncopal spell.  I  also scheduling him for carotid duplex imaging to assess his carotid arteries and vertebral flow.   At his April 08, 2019 office visit I had a long  discussion with Eugene [name] as well as his daughter by telephone who lives in [place].  I reviewed his Zio patch findings demonstrated predominant sinus rhythm but revealed several episodes of supraventricular tachycardia, as well as episodes of isolated ectopic in isolated ventricular ectopy.  His fastest heartbeat was 190 bpm which lasted 4 beats and was consistent with SVT.  The longest SVT episode was 12.8 seconds with an average rate of 138 bpm.  At the time of my discussion with the patient, I was only aware of his initial CTA findings which initially was interpreted as normal had been sent to my inbox which I had responded.  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  FFR analysis, however, was entirely normal and did not reveal hemodynamic significance.  Subsequently I contacted the patient once I became aware of this addended report and again had a lengthy discussion with both the patient and also called his daughter in [place] and reviewed the findings with her in detail and discussed treatment strategy.  Based on the CT findings I suggested the addition of metoprolol succinate initially at 12.5 mg for anti-ischemic benefit and suggested reinitiation of aspirin 81 mg.  I previously discussed further evaluation of his arrhythmia potential with a loop recorder.  I discussed probable definitive cardiac catheterization.    It is encouraging that his FFR analysis of all his areas of calcified plaque revealed no hemodynamic flow abnormalities.  After spending an additional 30 minutes with the patient and his daughter in the office today, and reviewing options I have  recommended definitive cardiac catheterization to delineate his coronary anatomy with 188% certainty.  With the addition of metoprolol succinate, his resting pulse today is 54 and for this reason I will not further titrate from his present dose of 12.5 mg.  He is now  on rosuvastatin 40 mg with Zetia for more aggressive lipid-lowering therapy and hope to hopefully induce some potential plaque regression over time.  I discussed the CTA findings in detail again and the fact that there was significant calcification noted with potential blooming artifact which may account for potential overestimation of the percent diameter stenosis and that FFR analysis in all vessels was normal arguing against significantly abnormal flow and hemodynamic significance.  I discussed the risk benefits of the catheterization procedure in detail.  Both the patient and his daughter agree with pursuing the definitive evaluation with cardiac catheterization.  I will tentatively schedule this to be done on May 02, 2019 in the morning and with plans for him to undergo loop recorder insertion with Dr. Sallyanne Kuster in follow-up of his catheterization procedure in early afternoon.  If high-grade stenoses are found which would potentially be treatable with intervention, I suspect this may need to be staged with potential need for atherectomy due to significant calcification if present.  Time spent: 30 minutes  Troy Sine, MD, [facility]  04/18/2019 2:21 PM

## 2019-04-18 NOTE — Progress Notes (Addendum)
Patient ID: [name], male   DOB: 1933/03/09, 85 y.o.   MRN: [id number]    Primary MD: Dr. Derinda Late  HPI: Eugene [name] is a 85 y.o. male who presents to the office today for a cardiology evaluation following a recent syncopal spell which occurred while he was riding a bike.  Eugene [name] has a remote history of sarcoidosis and had been followed at [facility] with complete remission. He has a history of mild hyperlipidemia and has been on Vytorin 10/40. He also has a history of BPH on Proscar.  An echo Doppler study which was done to evaluate a systolic murmur in the aortic region noted in December 2013 showed normal systolic function with mild focal basal hypertrophy of the septum. He had normal systolic function. There was evidence for a moderate diffuse calcification involving the noncoronary cusp of a trileaflet aortic valve. He did have sclerosis without stenosis. Valve area was 2.55 cm. RV size was upper normal to mildly dilated. A nuclear perfusion study done in 2013 was unchanged from 6 years previously and continued to show normal perfusion.  Laboratory done by Dr. Deforest Hoyles in August 2015 was normal with a hemoglobin of 14.5, hematocrit 43.8.  Renal function was excellent with a BUN of 19, Cr 0.99.  Fasting glucose was 78.  Lipid studies remained excellent with a total cholesterol 125, triglycerides 14, HDL cholesterol 44, and LDL cholesterol at 60.  Angiotensin converting enzyme was normal at 37 and last year was 33.  He underwent an echo Doppler study in 03/13/2015.  This revealed an ejection fraction at 60-65%. There was grade 1 diastolic dysfunction.  He had normal LV filling pressures.  His aortic valve was calcified and there was evidence for mild aortic stenosis with a mean gradient of 9, peak gradient of 18, and valve area of 1.87 cm and mild aortic insufficiency.  There was mild mitral annular calcification with trivial MR , mild LA dilatation, and  moderate TR with PA pressure 30 mm. He underwent right knee replacement surgery by Dr. Moshe [place] in November.  Postoperatively had some swelling and lower extremity venous studies were negative.  When I saw [name] in 2017, he was doing well from a cardiac standpoint.   Specifically, he denied any episodes of chest pain.  He was able to play tennis again following his knee surgery.  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  He had also undergone follow-up CT imaging asked and was found to have a new irregularly-shaped nodular density in the left lower lobe, alt to represent a possible focus of active infection/inflammation.  A follow-up CT was recommended.  After a course of antimicrobial therapy.  His CT also demonstrated a stable ectatic.  Borderline aneurysmal ascending thoracic aorta at 3.9 cm.  He had evidence for coronary calcification.   Laboratory in 2017 by his primary physician showed total cholesterol was 134, triglycerides 79, HDL 43, and LDL 75 on his current dose of ezetimibe 10 mg and simvastatin 40 mg.   When I saw [name] in March 2019 he was remaining active and was walking on elliptical machine at least 3-4 times per week and was playing tennis 2 times per week.  He denied any chest pain or shortness of breath.  He denies palpitations.   In 2019, he was found  to have a pulmonary infiltrate for which she was evaluated by Dr. Elsworth Soho.  He had undergone bronchoscopy with biopsy and ultimately underwent an evaluation at [facility] and this nodule had not significantly changed over the past 16 years.  I last evaluated [name] in May 2020 and a telemedicine visit.  At that time he denied any chest pain, palpitations, presyncope or syncope.  He denied any PND orthopnea.  He had previously been followed by Dr. Ardeth Perfect but he had recently signed up for concierge  medicine with Dr. Derinda Late.  During that evaluation I recommended he undergo a follow-up echo Doppler study to reassess his aortic valve since in 2016 he was found to have mild aortic stenosis.  He underwent an echo Doppler study on December 21, 2018 which continues to show normal systolic function with EF at 60 to 65%, and moderate asymmetric left ventricular hypertrophy.  There was grade 2 diastolic dysfunction.  There was mild aortic stenosis with moderate calcification of the aortic valve.  He had a mean systolic gradient of 11 mm and his peak gradient was 20.8 mm.  Calculated aortic valve was 1.7 cm and was consistent with mild aortic stenosis.  Eugene [name] has continued to be active.  On January 25, 2019 while biking at [place] he apparently had a syncopal spell.  He was found by bystanders.  Apparently he is amnesic to the event and when he woke up he was at [facility] in the emergency room.  It did not appear that he was aware when he had fallen since there was no apparent marks on his hands to break a fall.  A CT scan of the maxillofacial area demonstrated a comminuted displaced bilateral nasal bone fracture as well as fracture of the right zygomatic arch.  There was a frontal scalp hematoma.  He had extensive CT imaging of his chest abdomen and pelvis.  Of note, on his chest CT he was felt to have severe coronary artery atherosclerotic calcifications.  There was no evidence for acute traumatic injury within the chest abdomen or pelvis.  He had a 3.6 cm indeterminate liver mass, 4.3 cm indeterminate left renal lesion and had multiple bladder stones and bladder diverticula likely related to chronic outlet obstruction from an enlarged prostate and had nonobstructive bilateral nephrolithiasis.  Upon his return to [facility], he was evaluated by Hulan Saas in the [place] primary care.  He subsequently was evaluated by Dr. Al Pimple in our office for cardiology evaluation  during my absence.  With his memory absence, it is felt that he had a concussion.  When seen by Dr. Harrell Gave, she reviewed his extensive evaluation and recent echo which confirmed that he did not have significant aortic stenosis.  Because of his remote history of sarcoidosis, she scheduled [name] to undergo cardiac MRI imaging to assess for scar/infiltrative disease.  He was also scheduled to undergo 14-day Ziotelemetry monitor.    I saw [name] in follow-up of Dr. Harrell Gave on February 14, 2019.  At the time of his evaluation he had not yet received his Zio patch monitor.  I reviewed extensively his records from [facility] as well as the records of Dr. Harrell Gave.  His MR scan was reviewed and did not show any definitive myocardial late gadolinium enhancement so there was no definitive evidence for prior myocardial infarction, infiltrative disease or myocarditis.  LVEF was approximately 60%.  His RV EF was reported at 35% however when reviewed by Dr. Harrell Gave she had  felt in 20 function was somewhat better.  I scheduled [name] to undergo coronary CTA to assess potential CAD disease with silent ischemia leading to his clinical event.  He also underwent carotid duplex imaging.Marland Kitchen  His 14-day ZIO monitor showed predominant sinus rhythm with right bundle branch block.  He did have an average heart rate at 62 bpm with a minimum of 42 while sleeping and maximal heart rate of 190.  He had several short bursts of SVT the fastest interval lasting 4 beats with a maximum rate of 190.  The longest SVT episode was 12.8 seconds with an average heart rate of 138 bpm.  There was no VT or atrial fibrillation or high degree block or pauses noted.  Carotid duplex imaging essentially normal with normal antegrade vertebral and subclavian flow and was without evidence for internal carotid stenoses.  I received an initial report of his CT angiogram which came back as normal.  We had notified [name] of the results.  Apparently,  there was an addendum to the report port when it was reinterpreted adjusted multivessel CAD with a calcium score of 1151 with moderate calcific plaque in the mid distal left main, severe calcific plaque in the mid LAD mild calcific plaque in the circumflex and RCA.  There was concern of possible significant blooming artifact which may have contributed to overestimate of LAD stenoses.  Subsequent FFR analysis was normal and did not reveal any hemodynamic significant stenoses.   I had a long conversation with Eugene [name] as well as called his daughter in [place] in follow-up of his April 08, 2019 office visit.  At that time I recommended initiation of aspirin 81 mg and initiated metoprolol succinate 12.5 mg daily.  I also discussed changing [name] to more aggressive lipid-lowering therapy and he recently was started on rosuvastatin 40 mg in place of simvastatin.  Presently he denies chest pain.  His daughter arrived from [place] this weekend and he and his daughter are here in the office for follow-up evaluation and to discuss potential definitive cardiac catheterization.  Past Medical History:  Diagnosis Date   Bladder stones    BPH (benign prostatic hyperplasia)    Hernia of abdominal cavity    History of kidney stones    Hyperlipidemia    Incomplete right bundle branch block    Low O2 saturation 04/04/2015   Wife says O2 sats are in the low 90s base line.  Now in the 57s.  Will do incentive spirometry   Primary localized osteoarthritis of right knee 04/02/2015   Sarcoidosis    diagnosed 8-9 yrs ago   Upper GI bleed 04/04/2015   Patient had coffee ground emesis that was Guiac positive on Monday.  EGD scheduled for 04/04/2015. Started on IV Protonix    Past Surgical History:  Procedure Laterality Date   ANTERIOR CERVICAL DECOMP/DISCECTOMY FUSION  2007   BRONCHOSCOPY  2007   CARDIOVASCULAR STRESS TEST  04/29/2012   normal nuclear stress study.    CYSTOSCOPY  2004   DOPPLER  ECHOCARDIOGRAPHY  04/29/2012   EF not noted. small perimembranous ventricular septal defect. small left to right ventricular shunt. moderate diffuse calcification involving noncoronary cusp of the aortic valve.    ESOPHAGOGASTRODUODENOSCOPY  04/04/2015   ESOPHAGOGASTRODUODENOSCOPY N/A 04/04/2015   Procedure: ESOPHAGOGASTRODUODENOSCOPY (EGD);  Surgeon: Manus Gunning, MD;  Location: [facility];  Service: Gastroenterology;  Laterality: N/A;   INGUINAL HERNIA REPAIR Right 2009   TOTAL KNEE ARTHROPLASTY Right 04/02/2015   Procedure: TOTAL KNEE ARTHROPLASTY;  Surgeon: Elsie Saas, MD;  Location: [facility];  Service: Orthopedics;  Laterality: Right;   VIDEO BRONCHOSCOPY Bilateral 09/23/2017   Procedure: VIDEO BRONCHOSCOPY WITH FLUORO;  Surgeon: Rigoberto Noel, MD;  Location: [facility];  Service: Cardiopulmonary;  Laterality: Bilateral;    No Known Allergies  Current Outpatient Medications  Medication Sig Dispense Refill   aspirin EC 81 MG tablet Take 1 tablet (81 mg total) by mouth daily. 90 tablet 3   cholecalciferol (VITAMIN D3) 25 MCG (1000 UT) tablet Take 2,000 Units by mouth daily.     ezetimibe (ZETIA) 10 MG tablet TAKE (1) TABLET DAILY AT BEDTIME. 90 tablet 3   finasteride (PROSCAR) 5 MG tablet Take 5 mg by mouth every evening.      metoprolol succinate (TOPROL XL) 25 MG 24 hr tablet Take 0.5 tablets (12.5 mg total) by mouth daily. 45 tablet 6   simvastatin (ZOCOR) 40 MG tablet TAKE (1) TABLET DAILY AT BEDTIME. (Patient not taking: Reported on 04/18/2019) 90 tablet 0   No current facility-administered medications for this visit.     Social History   Socioeconomic History   Marital status: Married    Spouse name: Not on file   Number of children: Not on file   Years of education: Not on file   Highest education level: Not on file  Occupational History   Not on file  Social Needs   Financial resource strain: Not on file   Food insecurity    Worry: Not on  file    Inability: Not on file   Transportation needs    Medical: Not on file    Non-medical: Not on file  Tobacco Use   Smoking status: Former Smoker    Types: Pipe    Quit date: 05/26/1972    Years since quitting: 46.9   Smokeless tobacco: Never Used   Tobacco comment: Quit 43 year  Substance and Sexual Activity   Alcohol use: Yes    Alcohol/week: 4.0 standard drinks    Types: 4 Standard drinks or equivalent per week   Drug use: No   Sexual activity: Not on file  Lifestyle   Physical activity    Days per week: Not on file    Minutes per session: Not on file   Stress: Not on file  Relationships   Social connections    Talks on phone: Not on file    Gets together: Not on file    Attends religious service: Not on file    Active member of club or organization: Not on file    Attends meetings of clubs or organizations: Not on file    Relationship status: Not on file   Intimate partner violence    Fear of current or ex partner: Not on file    Emotionally abused: Not on file    Physically abused: Not on file    Forced sexual activity: Not on file  Other Topics Concern   Not on file  Social History Narrative   Not on file   Socially he is married. He is regional and 9 grandchildren. One son lives in [place], or lives in [place]. There is no tobacco use. He does take occasional alcohol. He does exercise.  Family History  Problem Relation Age of Onset   Ovarian cancer Mother    Heart attack Father 100    ROS General: Negative; No fevers, chills, or night sweats;  HEENT: Negative; No changes in vision or hearing, sinus congestion, difficulty swallowing Pulmonary:  Negative; No cough, wheezing, shortness of breath, hemoptysis Cardiovascular: Negative; No chest pain, presyncope, syncope, palpitations GI: Negative; No nausea, vomiting, diarrhea, or abdominal pain GU: Negative; No dysuria, hematuria, or difficulty voiding Musculoskeletal: Recent bilateral nasal  bone fractures and fracture of right zygomatic arch Hematologic/Oncology: Negative; no easy bruising, bleeding Remote history of sarcoidosis, completely resolved Endocrine: Negative; no heat/cold intolerance; no diabetes Neuro: Amnesic to his syncopal spell Skin: Negative; No rashes or skin lesions Psychiatric: Negative; No behavioral problems, depression Sleep: Negative; No snoring, daytime sleepiness, hypersomnolence, bruxism, restless legs, hypnogognic hallucinations, no cataplexy Other comprehensive 14 point system review is negative.   PE BP 117/73    Pulse 60    Temp 97.9 F (36.6 C)    Ht _0  (1.753 m)    Wt 190 lb (86.2 kg)    SpO2 97%    BMI 28.06 kg/m    Repeat blood pressure by me 130/74  Wt Readings from Last 3 Encounters:  04/18/19 190 lb (86.2 kg)  04/08/19 192 lb 3.2 oz (87.2 kg)  02/18/19 199 lb (90.3 kg)   General: Alert, oriented, no distress.  Skin: normal turgor, no rashes, warm and dry HEENT: Normocephalic, atraumatic. Pupils equal round and reactive to light; sclera anicteric; extraocular muscles intact;  Nose without nasal septal hypertrophy Mouth/Parynx benign; Mallinpatti scale 2 Neck: No JVD, no carotid bruits; normal carotid upstroke Lungs: clear to ausculatation and percussion; no wheezing or rales Chest wall: without tenderness to palpitation Heart: PMI not displaced, RRR, s1 s2 normal, 2/6 early peaking systolic murmur consistent with his mild aortic stenosis, no diastolic murmur, no rubs, gallops, thrills, or heaves Abdomen: soft, nontender; no hepatosplenomehaly, BS+; abdominal aorta nontender and not dilated by palpation. Back: no CVA tenderness Pulses 2+ Musculoskeletal: full range of motion, normal strength, no joint deformities Extremities: no clubbing cyanosis or edema, Homan's sign negative  Neurologic: grossly nonfocal; Cranial nerves grossly wnl Psychologic: Normal mood and affect   ECG (independently read by me): Sinus bradycardia at  54 bpm, right bundle branch block with repolarization changes.  Left axis deviation.  Borderline voltage criteria for LVH  April 08, 2019 ECG (independently read by me): Sinus rhythm at 65 bpm, left axis deviation, right bundle branch block with repolarization changes.  LVH by voltage.  QTc interval 453 ms.  PR interval 146 ms.  September 2020 ECG (independently read by me): Sinus bradycardia at 53 bpm, right bundle branch block with repolarization changes.  QRS duration 156 ms.  Mild LVH by voltage in aVL.  QTc interval 439 ms  March 2019 ECG (independently read by me): normal sinus rhythm at 65 bpm.  One isolated PVC.  Incomplete right bundle branch block.  Borderline LVH.  Normal intervals.  February 2018 ECG (independently read by me): Sinus bradycardia at 56 bpm.  Mild sinus arrhythmia.  Mild LVH.  Normal intervals.  No ST segment changes.  February 2017 ECG (independently read by me):  Normal sinus rhythm at 63 bpm.  Early transition.  No ST segment changes.  December 2015 ECG (independently read by me): Normal sinus rhythm at 66 bpm.  Mild RV conduction delay.  Early transition.  December 2014 ECG: Sinus rhythm with an occasional PVC; normal intervals.  LABS: I personally reviewed the laboratory from [place] done on 02/19/2016.  BUN 18, creatinine 0.9.  Ingrown 14.7, hematocrit 44.9.  Normal LFTs.  TSH 2.08.  April lipoprotein B 66.  Normal PSA.  Lipid studies as noted above.  Most  recent lipid studies from March 03, 2017: Total cholesterol 134, HDL 39, LDL 70, triglycerides 124.    BMP Latest Ref Rng & Units 02/24/2019 01/26/2019 08/27/2017  Glucose 65 - 99 mg/dL 88 83 126(H)  BUN 8 - 27 mg/dL _0 Creatinine 0.76 - 1.27 mg/dL 1.02 1.15 0.96  BUN/Creat Ratio 10 - 24 21 - -  Sodium 134 - 144 mmol/L 139 137 137  Potassium 3.5 - 5.2 mmol/L 4.8 4.2 4.3  Chloride 96 - 106 mmol/L 102 102 103  CO2 20 - 29 mmol/L _1 Calcium 8.6 - 10.2 mg/dL 10.3(H) 9.7  10.1   Hepatic Function Latest Ref Rng & Units 03/20/2015 08/21/2014  Total Protein 6.5 - 8.1 g/dL 6.6 7.0  Albumin 3.5 - 5.0 g/dL 4.0 4.1  AST 15 - 41 U/L 29 32  ALT 17 - 63 U/L 30 32  Alk Phosphatase 38 - 126 U/L 68 73  Total Bilirubin 0.3 - 1.2 mg/dL 0.8 0.7   CBC Latest Ref Rng & Units 01/26/2019 04/06/2015 04/05/2015  WBC 4.0 - 10.5 K/uL 10.8(H) 11.3(H) 10.8(H)  Hemoglobin 13.0 - 17.0 g/dL 14.3 8.9(L) 8.7(L)  Hematocrit 39.0 - 52.0 % 43.9 27.1(L) 26.5(L)  Platelets 150 - 400 K/uL 176 152 131(L)   Lab Results  Component Value Date   MCV 92.8 01/26/2019   MCV 91.2 04/06/2015   MCV 92.7 04/05/2015   Additional studies reviewed: I reviewed all the records from [facility] including his CT images of January 25, 2019 following his recent syncopal spell induced  Trauma.  ECHO 7//28/2020 IMPRESSIONS  1. The left ventricle has normal systolic function with an ejection fraction of 60-65%. The cavity size was normal. There is moderate asymmetric left ventricular hypertrophy. Left ventricular diastolic Doppler parameters are consistent with  pseudonormalization.  2. The right ventricle has normal systolic function. The cavity was normal. There is no increase in right ventricular wall thickness. Right ventricular systolic pressure could not be assessed.  3. Left atrial size was mildly dilated.  4. The aortic valve is abnormal. Moderate calcification of the aortic valve. Aortic valve regurgitation is trivial by color flow Doppler. Mild stenosis with a mean systolic gradient of 11 mmHg of the aortic valve.  5. The aorta is normal in size and structure when indexed to body surface area (ascending aorta measures 38 mm).  CARDIAC MRI 02/11/2019 IMPRESSION: 1. Normal left ventricular size with mild LV hypertrophy. This appears concentric and not asymmetric. EF 60%, normal wall motion.  2. The RV is mildly dilated with mild to moderate hypokinesis, EF 35%.  3. There is no  definite myocardial LGE, so no definitive evidence for prior MI, infiltrative disease, or myocarditis.  Abnormal right ventricle. However, there is no definitive evidence for cardiac sarcoidosis or hypertrophic cardiomyoapthy.  ----------------------------------------------------------------------------------------------------------- CTA CORONARY :  IMPRESSION: 03/07/2019  1. Coronary calcium score of 0. This was 0 percentile for age and sex matched control.  2. Normal coronary origin with right dominance.  3. No evidence of CAD.  Fransico [name]  ADDENDUM/ Edited Result :  CTA  Aorta: Normal size. Calcifications of the aortic root and ascending aorta. No dissection.  Aortic Valve:  Trileaflet.  Moderate calcifications.  Coronary Arteries:  Normal coronary origin.  Left dominance.  RCA is a small non dominant artery that gives rise to PDA and PLVB. There is mild calcified plaque in the proximal RCA with associated stenosis of 25-49%. There is motion artifact in the  mid and distal RCA.  Left main is a large artery that gives rise to LAD and LCX arteries. There is moderate calcified plaque in the mid to distal LM with associated 50-69% stenosis.  LAD is a large vessel that gives rise to a large D1 and moderate sized D2. There is moderate calcified plaque in the proximal LAD with associated 50-69% stenosis. There is severe calcified plaque in the mid LAD with associated stenosis of 70-99%.  LCX is a large dominant artery that gives rise to one large OM1 branch. There is minimal calcified plaque in the proximal to mid LCx with associated stenosis of 0-24%. There is mild calcified plaque in the mid LCx at the takeoff of a moderate sized branching OM with associated stenosis of 25-49%. There is minimal atherosclerosis of the distal LCx with associated stenosis of 0 - 24%.  Other findings:  Normal pulmonary vein drainage into the left atrium.  Normal let atrial  appendage without a thrombus.  Normal size of the pulmonary artery.  IMPRESSION: 1. Coronary calcium score of 1151. This was 66th percentile for age and sex matched control.  2.  Normal coronary origin with left dominance.  3.  Moderate to severe calcified AV cusps.  4.  Atherosclerosis of the aortic root and ascending aorta.  5. Moderate Calcified plaque in the distal LM and severe calcified plaque in the mid LAD. CAD-RADS 4b. Significant blooming may over estimate LAD stenosis.  6.  Recommend aggressive risk factor modification.  7.  Recommend cardiac catheterization.  8.  Study has been sent for [facility] analysis.  Fransico [name]  ----------------------------------------------------------------------------------------- FFR FINDINGS: FFRct analysis was performed on the original cardiac CT angiogram dataset. Diagrammatic representation of the FFRct analysis is provided in a separate PDF document in PACS. This dictation was created using the PDF document and an interactive 3D model of the results. 3D model is not available in the EMR/PACS. Normal FFR range is >0.80.  1. No significant flow limiting lesion: LM FFR = 0.99.  2. LAD: No significant flow limiting lesion: Proximal FFR = 0.98, Mid FFR = 0.96, Distal FFR = 0.89.  3. LCX: No significant flow limiting lesion: Proximal FFR = 0.97, Mid FFR = 0.94, Distal FFR = 0.91.  4. RCA: No flow limiting lesion in non dominant small RCA: Proximal FFR = 1.00, Mid FFR = 0.97, distal FFR not obtained.  IMPRESSION: 1. CT FFR analysis show no hemodynamically significant flow limiting lesions.   IMPRESSION:  1. Calcification of coronary artery   2. High coronary artery calcium score   3. Syncope and collapse   4. Paroxysmal SVT (supraventricular tachycardia) ([place])   5. Mild aortic stenosis   6. Hyperlipidemia with target LDL less than 70   7. RBBB (right bundle branch block with left anterior fascicular block)       ASSESSMENT AND PLAN: Eugene [name] is an 85 year old gentleman who has a remote history of prior sarcoid disease with ultimate complete remisson.  He has a history of mild hyperlipidemia which has been aggressively controlled.  Remotely, he was found to have mild coronary calcification on CT imaging.  I have been aggressive with lipid therapy in attempt to potentially induce plaque regression and he has been on Zetia/simvastatin 10/40 with LDL cholesterols previously documented to be 70.  In 2016 he was found to have aortic stenosis which was mild with mild aortic insufficiency with a mean gradient of 9 and peak gradient of 18.  Recently, [place], [place] apparently had a syncopal spell.  He has no recollection of falling and when he awakened he was in [facility] emergency room.  It does not appear that he had any significant awareness of rhythm disturbance and apparently when EMS arrived he must have had 6 stable cardiac rhythm.  In July 2020 I had obtained a follow-up echo Doppler study which still confirmed just mild aortic stenosis with a valve area of 1.7 cm.  It is unlikely that the severity of his aortic stenosis accounts for his syncope.  Recently, his ECG showed definite right bundle branch block which in the past had just been incomplete right bundle.  On his ECG of February 14, 2019 he was in sinus rhythm, although bradycardic at 53 with right bundle branch block.  QTc interval is normal.  That evaluation I was concerned potential ischemic etiology scheduled [name] to undergo a coronary CTA for further evaluation of potential significant coronary obstructive disease.  It is possible he does not have any chest pain but may have been ischemic if he does have significant obstructive disease contributing to his syncopal spell.  I  also scheduling [name] for carotid duplex imaging to assess his carotid arteries and vertebral flow.   At his April 08, 2019 office visit I had a long  discussion with Eugene [name] as well as his daughter by telephone who lives in [place].  I reviewed his Zio patch findings demonstrated predominant sinus rhythm but revealed several episodes of supraventricular tachycardia, as well as episodes of isolated ectopic in isolated ventricular ectopy.  His fastest heartbeat was 190 bpm which lasted 4 beats and was consistent with SVT.  The longest SVT episode was 12.8 seconds with an average rate of 138 bpm.  At the time of my discussion with the patient, I was only aware of his initial CTA findings which initially was interpreted as normal had been sent to my inbox which I had responded.  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  FFR analysis, however, was entirely normal and did not reveal hemodynamic significance.  Subsequently I contacted the patient once I became aware of this addended report and again had a lengthy discussion with both the patient and also called his daughter in [place] and reviewed the findings with her in detail and discussed treatment strategy.  Based on the CT findings I suggested the addition of metoprolol succinate initially at 12.5 mg for anti-ischemic benefit and suggested reinitiation of aspirin 81 mg.  I previously discussed further evaluation of his arrhythmia potential with a loop recorder.  I discussed probable definitive cardiac catheterization.    It is encouraging that his FFR analysis of all his areas of calcified plaque revealed no hemodynamic flow abnormalities.  After spending an additional 30 minutes with the patient and his daughter in the office today, and reviewing options I have  recommended definitive cardiac catheterization to delineate his coronary anatomy with 188% certainty.  With the addition of metoprolol succinate, his resting pulse today is 54 and for this reason I will not further titrate from his present dose of 12.5 mg.  He is now  on rosuvastatin 40 mg with Zetia for more aggressive lipid-lowering therapy and hope to hopefully induce some potential plaque regression over time.  I discussed the CTA findings in detail again and the fact that there was significant calcification noted with potential blooming artifact which may account for potential overestimation of the percent diameter stenosis and that FFR analysis in all vessels was normal arguing against significantly abnormal flow and hemodynamic significance.  I discussed the risk benefits of the catheterization procedure in detail.  Both the patient and his daughter agree with pursuing the definitive evaluation with cardiac catheterization.  I will tentatively schedule this to be done on May 02, 2019 in the morning and with plans for [name] to undergo loop recorder insertion with Dr. Sallyanne Kuster in follow-up of his catheterization procedure in early afternoon.  If high-grade stenoses are found which would potentially be treatable with intervention, I suspect this may need to be staged with potential need for atherectomy due to significant calcification if present.  Time spent: 30 minutes  Eugene Sine, MD, [facility]  04/18/2019 2:21 PM

## 2019-04-18 NOTE — Patient Instructions (Signed)
Testing/Procedures: Your physician has requested that you have a cardiac catheterization. Cardiac catheterization is used to diagnose and/or treat various heart conditions. Doctors may recommend this procedure for a number of different reasons. The most common reason is to evaluate chest pain. Chest pain can be a symptom of coronary artery disease (CAD), and cardiac catheterization can show whether plaque is narrowing or blocking your heart's arteries. This procedure is also used to evaluate the valves, as well as measure the blood flow and oxygen levels in different parts of your heart. For further information please visit [email or web address]. Please follow instruction sheet, as given.   Follow-Up: At [facility], you and your health needs are our priority.  As part of our continuing mission to provide you with exceptional heart care, we have created designated Provider Care Teams.  These Care Teams include your primary Cardiologist (physician) and Advanced Practice Providers (APPs -  Physician Assistants and Nurse Practitioners) who all work together to provide you with the care you need, when you need it.  Your next appointment:   Approximately 2-3 week(s) after your cardiac catheterization  The format for your next appointment:   In Person  Provider:   You may see Dr. Claiborne Billings or one of the following Advanced Practice Providers on your designated Care Team:    Almyra Deforest, PA-C  Fabian Sharp, [place] or   Roby Lofts, PA-C        [place] [address] Dept: [place]: [phone]  Jeffri Canela  04/18/2019  You are scheduled for a Cardiac Catheterization on Monday, December 7 with Dr. Shelva Majestic.  1. Please arrive at the [facility] (Main Entrance A) at [facility]: [address] [place], [place] [address] at 7:00 AM (This time is two hours before  your procedure to ensure your preparation). Free valet parking service is available.   Special note: Every effort is made to have your procedure done on time. Please understand that emergencies sometimes delay scheduled procedures.  2. Diet: Do not eat solid foods after midnight.  The patient may have clear liquids until 5am upon the day of the procedure.  3. Labs: You will need to have blood drawn on Thursday April 28, 2019 Go to:  Retail buyer at [employer] [id number], [place], [place] [address] FOR YOUR BASIC METABOLIC PANEL, COMPLETE BLOOD COUNT, AND THYROID STIMULATING HORMONE LAB WORK. NO APPOINTMENT IS NEEDED. YOU MUST HAVE THIS LAB WORK DONE BEFORE GOING TO GET YOUR COVID-19 TEST DONE BECAUSE YOU WILL NEED TO SELF-ISOLATE AFTER THE COVID-19 TEST UNTIL THE DAY OF YOUR [place].  You will need a COVID-19 test on Thursday April 28, 2019. Your appointment is at 2:30 pm. Go to: [facility] Entrance [place], [place] [address] FOR YOUR COVID-19 TEST. YOU MUST HAVE YOUR COVID-19 TEST COMPLETED 4 DAYS PRIOR TO YOUR UPCOMING PROCEDURE/TEST. YOU WILL ALSO NEED TO SELF-ISOLATE AFTER THE COVID-19 TEST UNTIL THE DAY OF YOUR PROCEDURE/TEST. PLEASE BRING YOUR I.D. AND YOUR INSURANCE CARD(S) WITH YOU.   4. Medication instructions in preparation for your procedure:   On the morning of your procedure, take your Aspirin and any morning medicines NOT listed above.  You may use sips of water.  5. Plan for one night stay--bring personal belongings. 6. Bring a current list of your medications and current insurance cards. 7. You MUST have a responsible person to drive  you home. 8. Someone MUST be with you the first 24 hours after you arrive home or your discharge will be delayed. 9. Please wear clothes that are easy to get on and off and wear slip-on shoes.  Thank you for allowing [place] to care for you!   -- [facility]

## 2019-04-26 ENCOUNTER — Telehealth: Payer: Self-pay | Admitting: Cardiovascular Disease

## 2019-04-26 NOTE — Telephone Encounter (Signed)
New Message    Pt is scheduled on 12/07 for a cath lab appt and Vaughan Basta is the pts daughter, she would like to assist the pt to this procedure. She also says the pts wife will want to be there as well. She says that the pts wife will not understand all that Dr Claiborne Billings will tell and Vaughan Basta needs to be there to assist.     Please call

## 2019-04-26 NOTE — Telephone Encounter (Addendum)
I will forward to Anderson Malta, RN at [facility] to see if she has any information about whether exception can be made for daughter to [facility] policy that only one support person is allowed to accompany patient to the hospital.

## 2019-04-26 NOTE — Telephone Encounter (Signed)
New Message:   Wife is calling wanting to talk to a nurse please. She have some questions about Cath and what does pt need to expect on the second day.

## 2019-04-26 NOTE — Telephone Encounter (Signed)
Called daughter back- she states that she wanted to know what the policy was for the Cath procedures. I advised that currently they are only allowing one support person- she states that she would like to know if there are any circumstances that would also allow her to be there as well, because her mother and father are 2 and 83 years old and are always together, but her mother becomes very overwhelmed and tired and does not always understand what Dr.Kelly is saying- and the daughter also wants to accompany. I did advise it was a hospital policy, and we don't normally have any pull, but I would route a message to Webb Silversmith to find out if anything different could be done. Patient daughter thankful for the call back.

## 2019-04-26 NOTE — Telephone Encounter (Signed)
Returned call to patient's wife.She stated husband is having a cardiac cath on Mon 12/7.Stated husband is planning on going to work on [place] 12/8 he has a Engineer, drilling.Advised it will depend on cath results.Advised to ask Dr.Kelly after cath.

## 2019-04-28 ENCOUNTER — Other Ambulatory Visit (HOSPITAL_COMMUNITY)
Admission: RE | Admit: 2019-04-28 | Discharge: 2019-04-28 | Disposition: A | Discharge status: Home / Self Care | Payer: Medicare Other | Source: Ambulatory Visit | Attending: Cardiovascular Disease | Admitting: Cardiovascular Disease

## 2019-04-28 ENCOUNTER — Telehealth: Payer: Self-pay | Admitting: *Deleted

## 2019-04-28 DIAGNOSIS — Z20828 Contact with and (suspected) exposure to other viral communicable diseases: Secondary | ICD-10-CM | POA: Diagnosis not present

## 2019-04-28 DIAGNOSIS — I2584 Coronary atherosclerosis due to calcified coronary lesion: Secondary | ICD-10-CM | POA: Diagnosis not present

## 2019-04-28 DIAGNOSIS — Z01812 Encounter for preprocedural laboratory examination: Secondary | ICD-10-CM | POA: Diagnosis present

## 2019-04-28 DIAGNOSIS — Z961 Presence of intraocular lens: Secondary | ICD-10-CM | POA: Diagnosis not present

## 2019-04-28 DIAGNOSIS — I2510 Atherosclerotic heart disease of native coronary artery without angina pectoris: Secondary | ICD-10-CM | POA: Diagnosis not present

## 2019-04-28 NOTE — Telephone Encounter (Signed)
LMTCB on mobile phone number listed for pt to call back to review procedure instructions.

## 2019-04-28 NOTE — Telephone Encounter (Signed)
Per [facility] policy/cath lab-only one support person will be allowed to accompany patient to the hospital. With patient's permission, in addition to talking with patient's wife after the procedure,  the doctor can call pt's daughter  and discuss findings.

## 2019-04-28 NOTE — Telephone Encounter (Addendum)
Pt contacted pre-catheterization scheduled at [facility] for: Monday May 02, 2019 9 AM Verified arrival time and place: [facility]) at: 7 AM Loop recorder -3:30 PM   No solid food after midnight prior to cath, clear liquids until 5 AM day of procedure. Contrast allergy: no   AM meds can be  taken pre-cath with sip of water including: ASA 81 mg   Confirmed patient has responsible adult to drive home post procedure and observe 24 hours after arriving home: yes  Currently, due to Covid-19 pandemic, only one support person will be allowed with patient. Must be the same support person for that patient's entire stay, will be screened and required to wear a mask. They will be asked to wait in the waiting room for the duration of the patient's stay.  Patients are required to wear a mask when they enter the hospital.      COVID-19 Pre-Screening Questions:  . In the past 7 to 10 days have you had a cough,  shortness of breath, headache, congestion, fever (100 or greater) body aches, chills, sore throat, or sudden loss of taste or sense of smell? no . Have you been around anyone with known Covid 19? no . Have you been around anyone who is awaiting Covid 19 test results in the past 7 to 10 days? no . Have you been around anyone who has been exposed to Covid 19, or has mentioned symptoms of Covid 19 within the past 7 to 10 days? no   I reviewed procedure/mask/visitor instructions, Covid-19 screening questions with patient, he verbalized understanding, thanked me for call.  Pt is aware that only one support person will be allowed to accompany him to hospital. Pt is aware that doctor will discuss findings of procedure with support person that comes with him to the hospital and can also discuss findings with another support if pt requests and provides contact information.

## 2019-04-29 LAB — TSH: TSH: 1.63 u[IU]/mL (ref 0.450–4.500)

## 2019-04-29 LAB — CBC
Hematocrit: 44.1 % (ref 37.5–51.0)
Hemoglobin: 14.5 g/dL (ref 13.0–17.7)
MCH: 30 pg (ref 26.6–33.0)
MCHC: 32.9 g/dL (ref 31.5–35.7)
MCV: 91 fL (ref 79–97)
Platelets: 187 10*3/uL (ref 150–450)
RBC: 4.83 x10E6/uL (ref 4.14–5.80)
RDW: 13.1 % (ref 11.6–15.4)
WBC: 9.5 10*3/uL (ref 3.4–10.8)

## 2019-04-29 LAB — BASIC METABOLIC PANEL
BUN/Creatinine Ratio: 23 (ref 10–24)
BUN: 21 mg/dL (ref 8–27)
CO2: 27 mmol/L (ref 20–29)
Calcium: 10 mg/dL (ref 8.6–10.2)
Chloride: 102 mmol/L (ref 96–106)
Creatinine, Ser: 0.91 mg/dL (ref 0.76–1.27)
GFR calc Af Amer: 89 mL/min/{1.73_m2} (ref >59)
GFR calc non Af Amer: 77 mL/min/{1.73_m2} (ref >59)
Glucose: 106 mg/dL — ABNORMAL HIGH (ref 65–99)
Potassium: 4.9 mmol/L (ref 3.5–5.2)
Sodium: 139 mmol/L (ref 134–144)

## 2019-04-30 ENCOUNTER — Other Ambulatory Visit: Payer: Self-pay | Admitting: *Deleted

## 2019-05-01 ENCOUNTER — Other Ambulatory Visit: Payer: Self-pay

## 2019-05-01 LAB — NOVEL CORONAVIRUS, NAA (HOSP ORDER, SEND-OUT TO REF LAB; TAT 18-24 HRS): SARS-CoV-2, NAA: NOT DETECTED

## 2019-05-02 ENCOUNTER — Encounter (HOSPITAL_COMMUNITY)
Admission: RE | Disposition: A | Discharge status: Home / Self Care | Payer: Self-pay | Source: Home / Self Care | Attending: Cardiovascular Disease

## 2019-05-02 ENCOUNTER — Ambulatory Visit (HOSPITAL_COMMUNITY)
Admission: RE | Admit: 2019-05-02 | Discharge: 2019-05-02 | Disposition: A | Discharge status: Home / Self Care | Payer: Medicare Other | Attending: Cardiovascular Disease | Admitting: Cardiovascular Disease

## 2019-05-02 ENCOUNTER — Other Ambulatory Visit: Payer: Self-pay

## 2019-05-02 ENCOUNTER — Encounter (HOSPITAL_COMMUNITY): Payer: Self-pay | Admitting: Cardiovascular Disease

## 2019-05-02 DIAGNOSIS — Z7982 Long term (current) use of aspirin: Secondary | ICD-10-CM | POA: Insufficient documentation

## 2019-05-02 DIAGNOSIS — Z87891 Personal history of nicotine dependence: Secondary | ICD-10-CM | POA: Insufficient documentation

## 2019-05-02 DIAGNOSIS — Z8249 Family history of ischemic heart disease and other diseases of the circulatory system: Secondary | ICD-10-CM | POA: Insufficient documentation

## 2019-05-02 DIAGNOSIS — I452 Bifascicular block: Secondary | ICD-10-CM | POA: Diagnosis not present

## 2019-05-02 DIAGNOSIS — D869 Sarcoidosis, unspecified: Secondary | ICD-10-CM | POA: Diagnosis not present

## 2019-05-02 DIAGNOSIS — N400 Enlarged prostate without lower urinary tract symptoms: Secondary | ICD-10-CM | POA: Diagnosis not present

## 2019-05-02 DIAGNOSIS — Z79899 Other long term (current) drug therapy: Secondary | ICD-10-CM | POA: Diagnosis not present

## 2019-05-02 DIAGNOSIS — I352 Nonrheumatic aortic (valve) stenosis with insufficiency: Secondary | ICD-10-CM | POA: Diagnosis not present

## 2019-05-02 DIAGNOSIS — R931 Abnormal findings on diagnostic imaging of heart and coronary circulation: Secondary | ICD-10-CM | POA: Insufficient documentation

## 2019-05-02 DIAGNOSIS — I4510 Unspecified right bundle-branch block: Secondary | ICD-10-CM | POA: Diagnosis not present

## 2019-05-02 DIAGNOSIS — I2510 Atherosclerotic heart disease of native coronary artery without angina pectoris: Secondary | ICD-10-CM | POA: Diagnosis not present

## 2019-05-02 DIAGNOSIS — I471 Supraventricular tachycardia: Secondary | ICD-10-CM | POA: Diagnosis not present

## 2019-05-02 DIAGNOSIS — E785 Hyperlipidemia, unspecified: Secondary | ICD-10-CM | POA: Insufficient documentation

## 2019-05-02 DIAGNOSIS — I2584 Coronary atherosclerosis due to calcified coronary lesion: Secondary | ICD-10-CM | POA: Diagnosis not present

## 2019-05-02 DIAGNOSIS — R55 Syncope and collapse: Secondary | ICD-10-CM | POA: Diagnosis not present

## 2019-05-02 HISTORY — PX: LEFT HEART CATH AND CORONARY ANGIOGRAPHY: CATH118249

## 2019-05-02 HISTORY — PX: LOOP RECORDER INSERTION: EP1214

## 2019-05-02 SURGERY — LOOP RECORDER INSERTION

## 2019-05-02 SURGERY — LEFT HEART CATH AND CORONARY ANGIOGRAPHY
Anesthesia: LOCAL

## 2019-05-02 MED ORDER — SODIUM CHLORIDE 0.9% FLUSH: 3.0000 mL | Freq: Two times a day (BID) | INTRAVENOUS | Status: DC

## 2019-05-02 MED ORDER — ACETAMINOPHEN 325 MG PO TABS: 650.0000 mg | ORAL_TABLET | ORAL | Status: DC | PRN

## 2019-05-02 MED ORDER — SODIUM CHLORIDE 0.9% FLUSH: 3.0000 mL | INTRAVENOUS | Status: DC | PRN

## 2019-05-02 MED ORDER — FENTANYL CITRATE (PF) 100 MCG/2ML IJ SOLN
INTRAMUSCULAR | Status: DC | PRN
  Administered 2019-05-02: 25 ug via INTRAVENOUS

## 2019-05-02 MED ORDER — METOPROLOL SUCCINATE 12.5 MG HALF TABLET: 12.5000 mg | ORAL_TABLET | Freq: Every day | ORAL | Status: DC

## 2019-05-02 MED ORDER — LIDOCAINE HCL (PF) 1 % IJ SOLN
INTRAMUSCULAR | Status: AC
  Filled 2019-05-02: qty 30

## 2019-05-02 MED ORDER — DIAZEPAM 5 MG PO TABS: 5.0000 mg | ORAL_TABLET | Freq: Four times a day (QID) | ORAL | Status: DC | PRN

## 2019-05-02 MED ORDER — HEPARIN (PORCINE) IN NACL 1000-0.9 UT/500ML-% IV SOLN
INTRAVENOUS | Status: DC | PRN
  Administered 2019-05-02 (×2): 500 mL

## 2019-05-02 MED ORDER — FENTANYL CITRATE (PF) 100 MCG/2ML IJ SOLN
INTRAMUSCULAR | Status: AC
  Filled 2019-05-02: qty 2

## 2019-05-02 MED ORDER — ASPIRIN 81 MG PO CHEW: 81.0000 mg | CHEWABLE_TABLET | ORAL | Status: DC

## 2019-05-02 MED ORDER — EZETIMIBE 10 MG PO TABS: 10.0000 mg | ORAL_TABLET | Freq: Every day | ORAL | Status: DC

## 2019-05-02 MED ORDER — LIDOCAINE-EPINEPHRINE 1 %-1:100000 IJ SOLN
INTRAMUSCULAR | Status: AC
  Filled 2019-05-02: qty 1

## 2019-05-02 MED ORDER — IOHEXOL 350 MG/ML SOLN
INTRAVENOUS | Status: DC | PRN
  Administered 2019-05-02: 50 mL

## 2019-05-02 MED ORDER — MIDAZOLAM HCL 2 MG/2ML IJ SOLN
INTRAMUSCULAR | Status: DC | PRN
  Administered 2019-05-02: 1 mg via INTRAVENOUS

## 2019-05-02 MED ORDER — LIDOCAINE HCL (PF) 1 % IJ SOLN
INTRAMUSCULAR | Status: DC | PRN
  Administered 2019-05-02: 2 mL

## 2019-05-02 MED ORDER — HEPARIN (PORCINE) IN NACL 1000-0.9 UT/500ML-% IV SOLN
INTRAVENOUS | Status: AC
  Filled 2019-05-02: qty 1000

## 2019-05-02 MED ORDER — LABETALOL HCL 5 MG/ML IV SOLN: 10.0000 mg | INTRAVENOUS | Status: DC | PRN

## 2019-05-02 MED ORDER — LIDOCAINE-EPINEPHRINE 1 %-1:100000 IJ SOLN
INTRAMUSCULAR | Status: DC | PRN
  Administered 2019-05-02: 20 mL

## 2019-05-02 MED ORDER — SODIUM CHLORIDE 0.9 % WEIGHT BASED INFUSION
3.0000 mL/kg/h | INTRAVENOUS | Status: AC
  Administered 2019-05-02: 3 mL/kg/h via INTRAVENOUS

## 2019-05-02 MED ORDER — ONDANSETRON HCL 4 MG/2ML IJ SOLN: 4.0000 mg | Freq: Four times a day (QID) | INTRAMUSCULAR | Status: DC | PRN

## 2019-05-02 MED ORDER — SODIUM CHLORIDE 0.9 % IV SOLN: 250.0000 mL | INTRAVENOUS | Status: DC | PRN

## 2019-05-02 MED ORDER — MIDAZOLAM HCL 2 MG/2ML IJ SOLN
INTRAMUSCULAR | Status: AC
  Filled 2019-05-02: qty 2

## 2019-05-02 MED ORDER — VITAMIN D 50 MCG (2000 UT) PO CAPS: 2000.0000 [IU] | ORAL_CAPSULE | Freq: Every day | ORAL | Status: DC

## 2019-05-02 MED ORDER — VERAPAMIL HCL 2.5 MG/ML IV SOLN
INTRAVENOUS | Status: AC
  Filled 2019-05-02: qty 2

## 2019-05-02 MED ORDER — ASPIRIN 81 MG PO CHEW: 81.0000 mg | CHEWABLE_TABLET | Freq: Every day | ORAL | Status: DC

## 2019-05-02 MED ORDER — VERAPAMIL HCL 2.5 MG/ML IV SOLN
INTRAVENOUS | Status: DC | PRN
  Administered 2019-05-02: 10 mL via INTRA_ARTERIAL

## 2019-05-02 MED ORDER — SODIUM CHLORIDE 0.9 % IV SOLN: INTRAVENOUS | Status: DC

## 2019-05-02 MED ORDER — HEPARIN SODIUM (PORCINE) 1000 UNIT/ML IJ SOLN
INTRAMUSCULAR | Status: DC | PRN
  Administered 2019-05-02: 4500 [IU] via INTRAVENOUS

## 2019-05-02 MED ORDER — HYDRALAZINE HCL 20 MG/ML IJ SOLN: 10.0000 mg | INTRAMUSCULAR | Status: DC | PRN

## 2019-05-02 MED ORDER — ASPIRIN EC 81 MG PO TBEC
81.0000 mg | DELAYED_RELEASE_TABLET | Freq: Every day | ORAL | Status: DC
  Filled 2019-05-02: qty 1

## 2019-05-02 MED ORDER — ROSUVASTATIN CALCIUM 20 MG PO TABS: 40.0000 mg | ORAL_TABLET | Freq: Every day | ORAL | Status: DC

## 2019-05-02 MED ORDER — SODIUM CHLORIDE 0.9 % WEIGHT BASED INFUSION: 1.0000 mL/kg/h | INTRAVENOUS | Status: DC

## 2019-05-02 SURGICAL SUPPLY — 10 items
CATH INFINITI JR4 5F (CATHETERS) ×1 IMPLANT
CATH OPTITORQUE TIG 4.0 5F (CATHETERS) ×1 IMPLANT
DEVICE RAD COMP TR BAND LRG (VASCULAR PRODUCTS) ×1 IMPLANT
GLIDESHEATH SLEND SS 6F .021 (SHEATH) ×1 IMPLANT
GUIDEWIRE INQWIRE 1.5J.035X260 (WIRE) IMPLANT
INQWIRE 1.5J .035X260CM (WIRE) ×2
KIT HEART LEFT (KITS) ×2 IMPLANT
PACK CARDIAC CATHETERIZATION (CUSTOM PROCEDURE TRAY) ×2 IMPLANT
TRANSDUCER W/STOPCOCK (MISCELLANEOUS) ×2 IMPLANT
TUBING CIL FLEX 10 FLL-RA (TUBING) ×2 IMPLANT

## 2019-05-02 SURGICAL SUPPLY — 2 items
MONITOR REVEAL LINQ II (Prosthesis & Implant Heart) ×2 IMPLANT
PACK LOOP INSERTION (CUSTOM PROCEDURE TRAY) ×3 IMPLANT

## 2019-05-02 NOTE — Op Note (Signed)
LOOP RECORDER IMPLANT   Procedure report  Procedure performed:  Loop recorder implantation   Reason for procedure:  Recurrent syncope/near-syncope Procedure performed by:  Sanda Klein, MD  Complications:  None  Estimated blood loss:  <5 mL  Medications administered during procedure:  Lidocaine 1% with 1/10,000 epinephrine 10 mL locally Device details:  Medtronic Reveal Linq model number [id number], serial number [id number] Procedure details:  After the risks and benefits of the procedure were discussed the patient provided informed consent. The patient was prepped and draped in usual sterile fashion. Local anesthesia was administered to an area 2 cm to the left of the sternum in the 4th intercostal space. A cutaneous incision was made using the incision tool. The introducer was then used to create a subcutaneous tunnel and carefully deploy the device. Local pressure was held to ensure hemostasis.  The incision was closed with SteriStrips and a sterile dressing was applied.  R wave 0.85 mV.  Sanda Klein, MD, [facility] [phone] office [phone] pager 05/02/2019 1:05 PM

## 2019-05-02 NOTE — Progress Notes (Signed)
Arm splint applied to right arm.  

## 2019-05-02 NOTE — Discharge Instructions (Signed)
Supplemental Discharge Instructions for  Pacemaker/Defibrillator Patients  Activity No activity restrictions (see below for restrictions after cardiac ctheterization). DO wear your seatbelt, even if it crosses over the pacemaker site.  WOUND CARE - Keep the wound area clean and dry.  Remove the dressing the day after you return home (usually 48 hours after the procedure). - DO NOT SUBMERGE UNDER WATER UNTIL FULLY HEALED (no tub baths, hot tubs, swimming pools, etc.).  - You  may shower or take a sponge bath after the dressing is removed. DO NOT SOAK the area and do not allow the shower to directly spray on the site. - If you have tape/steri-strips on your wound, these will fall off; do not pull them off prematurely.   - No bandage is needed on the site.  DO  NOT apply any creams, oils, or ointments to the wound area. - If you notice any drainage or discharge from the wound, any swelling, excessive redness or bruising at the site, or if you develop a fever > 101? F after you are discharged home, call the office at once.  Special Instructions - You are still able to use cellular telephones.  Avoid carrying your cellular phone near your device. - When traveling through airports, show security personnel your identification card to avoid being screened in the metal detectors.  - Avoid arc welding equipment, MRI testing (magnetic resonance imaging), TENS units (transcutaneous nerve stimulators).  Call the office for questions about other devices. - Avoid electrical appliances that are in poor condition or are not properly grounded. - Microwave ovens are safe to be near or to operate.        Radial Site Care  This sheet gives you information about how to care for yourself after your procedure. Your health care provider may also give you more specific instructions. If you have problems or questions, contact your health care provider. What can I expect after the procedure? After the procedure,  it is common to have:  Bruising and tenderness at the catheter insertion area. Follow these instructions at home: Medicines  Take over-the-counter and prescription medicines only as told by your health care provider. Insertion site care  Follow instructions from your health care provider about how to take care of your insertion site. Make sure you: ? Wash your hands with soap and water before you change your bandage (dressing). If soap and water are not available, use hand sanitizer. ? Change your dressing as told by your health care provider. ? Leave stitches (sutures), skin glue, or adhesive strips in place. These skin closures may need to stay in place for 2 weeks or longer. If adhesive strip edges start to loosen and curl up, you may trim the loose edges. Do not remove adhesive strips completely unless your health care provider tells you to do that.  Check your insertion site every day for signs of infection. Check for: ? Redness, swelling, or pain. ? Fluid or blood. ? Pus or a bad smell. ? Warmth.  Do not take baths, swim, or use a hot tub until your health care provider approves.  You may shower 24-48 hours after the procedure, or as directed by your health care provider. ? Remove the dressing and gently wash the site with plain soap and water. ? Pat the area dry with a clean towel. ? Do not rub the site. That could cause bleeding.  Do not apply powder or lotion to the site. Activity   For 24 hours after  the procedure, or as directed by your health care provider: ? Do not flex or bend the affected arm. ? Do not push or pull heavy objects with the affected arm. ? Do not drive yourself home from the hospital or clinic. You may drive 24 hours after the procedure unless your health care provider tells you not to. ? Do not operate machinery or power tools.  Do not lift anything that is heavier than 10 lb (4.5 kg), or the limit that you are told, until your health care provider  says that it is safe.  Ask your health care provider when it is okay to: ? Return to work or school. ? Resume usual physical activities or sports. ? Resume sexual activity. General instructions  If the catheter site starts to bleed, raise your arm and put firm pressure on the site. If the bleeding does not stop, get help right away. This is a medical emergency.  If you went home on the same day as your procedure, a responsible adult should be with you for the first 24 hours after you arrive home.  Keep all follow-up visits as told by your health care provider. This is important. Contact a health care provider if:  You have a fever.  You have redness, swelling, or yellow drainage around your insertion site. Get help right away if:  You have unusual pain at the radial site.  The catheter insertion area swells very fast.  The insertion area is bleeding, and the bleeding does not stop when you hold steady pressure on the area.  Your arm or hand becomes pale, cool, tingly, or numb. These symptoms may represent a serious problem that is an emergency. Do not wait to see if the symptoms will go away. Get medical help right away. Call your local emergency services (911 in the [place] Do not drive yourself to the hospital. Summary  After the procedure, it is common to have bruising and tenderness at the site.  Follow instructions from your health care provider about how to take care of your radial site wound. Check the wound every day for signs of infection.  Do not lift anything that is heavier than 10 lb (4.5 kg), or the limit that you are told, until your health care provider says that it is safe. This information is not intended to replace advice given to you by your health care provider. Make sure you discuss any questions you have with your health care provider. Document Released: 06/14/2010 Document Revised: 06/17/2017 Document Reviewed: 06/17/2017 Elsevier Patient Education  2020  [employer].

## 2019-05-02 NOTE — Progress Notes (Signed)
Discharge instructions reviewed with pt and his wife (via Telephone) both voice understanding. Also reviewed loop ercorder instructins.

## 2019-05-02 NOTE — Interval H&P Note (Signed)
Cath Lab Visit (complete for each Cath Lab visit)  Clinical Evaluation Leading to the Procedure:   ACS: No.  Non-ACS:    Anginal Classification: CCS II  Anti-ischemic medical therapy: Minimal Therapy (1 class of medications)  Non-Invasive Test Results: Intermediate-risk stress test findings: cardiac mortality 1-3%/year  Prior CABG: No previous CABG      History and Physical Interval Note:  05/02/2019 8:48 AM  Eugene [name]  has presented today for surgery, with the diagnosis of cad.  The various methods of treatment have been discussed with the patient and family. After consideration of risks, benefits and other options for treatment, the patient has consented to  Procedure(s): LEFT HEART CATH AND CORONARY ANGIOGRAPHY (N/A) as a surgical intervention.  The patient's history has been reviewed, patient examined, no change in status, stable for surgery.  I have reviewed the patient's chart and labs.  Questions were answered to the patient's satisfaction.     Shelva Majestic

## 2019-05-02 NOTE — Progress Notes (Signed)
Pt continues to move right arm/ wrist  even after being instructed not to move several times

## 2019-05-03 ENCOUNTER — Encounter (HOSPITAL_COMMUNITY): Payer: Self-pay | Admitting: Cardiovascular Disease

## 2019-05-04 DIAGNOSIS — B070 Plantar wart: Secondary | ICD-10-CM | POA: Diagnosis not present

## 2019-05-04 DIAGNOSIS — Z85828 Personal history of other malignant neoplasm of skin: Secondary | ICD-10-CM | POA: Diagnosis not present

## 2019-05-17 ENCOUNTER — Other Ambulatory Visit: Payer: Self-pay

## 2019-05-17 ENCOUNTER — Encounter: Payer: Self-pay | Admitting: Student

## 2019-05-17 ENCOUNTER — Ambulatory Visit (INDEPENDENT_AMBULATORY_CARE_PROVIDER_SITE_OTHER): Payer: Medicare Other | Admitting: Student

## 2019-05-17 DIAGNOSIS — R55 Syncope and collapse: Secondary | ICD-10-CM

## 2019-05-17 LAB — CUP PACEART INCLINIC DEVICE CHECK
Date Time Interrogation Session: 20201222084545
Implantable Pulse Generator Implant Date: 20201207

## 2019-05-17 NOTE — Progress Notes (Signed)
ILR wound check in clinic. Steri strips previously removed. Wound well healed. Home monitor transmitting nightly. No episodes. Questions answered. 

## 2019-05-19 ENCOUNTER — Encounter: Payer: Self-pay | Admitting: Cardiovascular Disease

## 2019-05-19 ENCOUNTER — Other Ambulatory Visit: Payer: Self-pay

## 2019-05-19 ENCOUNTER — Ambulatory Visit (INDEPENDENT_AMBULATORY_CARE_PROVIDER_SITE_OTHER): Payer: Medicare Other | Admitting: Cardiovascular Disease

## 2019-05-19 DIAGNOSIS — R55 Syncope and collapse: Secondary | ICD-10-CM

## 2019-05-19 DIAGNOSIS — E785 Hyperlipidemia, unspecified: Secondary | ICD-10-CM

## 2019-05-19 DIAGNOSIS — I452 Bifascicular block: Secondary | ICD-10-CM

## 2019-05-19 DIAGNOSIS — I2584 Coronary atherosclerosis due to calcified coronary lesion: Secondary | ICD-10-CM

## 2019-05-19 DIAGNOSIS — I2510 Atherosclerotic heart disease of native coronary artery without angina pectoris: Secondary | ICD-10-CM

## 2019-05-19 DIAGNOSIS — I471 Supraventricular tachycardia: Secondary | ICD-10-CM

## 2019-05-19 DIAGNOSIS — I350 Nonrheumatic aortic (valve) stenosis: Secondary | ICD-10-CM

## 2019-05-19 NOTE — Patient Instructions (Signed)
Medication Instructions:  Your physician recommends that you continue on your current medications as directed. Please refer to the Current Medication list given to you today.  *If you need a refill on your cardiac medications before your next appointment, please call your pharmacy*  Lab Work: Your physician recommends that you return for lab work before your follow up appointment in March 2021  [place]      If you have labs (blood work) drawn today and your tests are completely normal, you will receive your results only by: Marland Kitchen MyChart Message (if you have MyChart) OR . A paper copy in the mail If you have any lab test that is abnormal or we need to change your treatment, we will call you to review the results.  Testing/Procedures: Your physician has requested that you have an echocardiogram. Echocardiography is a painless test that uses sound waves to create images of your heart. It provides your doctor with information about the size and shape of your heart and how well your heart's chambers and valves are working. This procedure takes approximately one hour. There are no restrictions for this procedure. LOCATION: [place] at [facility]: [place], [place], [place] [address] SCHEDULED FOR 07/05/2019  Follow-Up: At [facility], you and your health needs are our priority.  As part of our continuing mission to provide you with exceptional heart care, we have created designated Provider Care Teams.  These Care Teams include your primary Cardiologist (physician) and Advanced Practice Providers (APPs -  Physician Assistants and Nurse Practitioners) who all work together to provide you with the care you need, when you need it.  Your next appointment:   MARCH 2021  The format for your next appointment:   In Person  Provider:   Shelva Majestic, MD

## 2019-05-19 NOTE — Progress Notes (Signed)
Patient ID: Eugene [name], male   DOB: 25-Jul-1932, 85 y.o.   MRN: [id number]    Primary MD: Dr. Derinda [name]  HPI: Eugene [name] is a 85 y.o. male who presents to the office today for a cardiology evaluation and follow-up of his recent cardiac catheterization and loop monitor insertion.  Eugene [name] has a remote history of sarcoidosis and had been followed at [name] with complete remission. He has a history of mild hyperlipidemia and has been on Vytorin 10/40. He also has a history of BPH on Proscar.  An echo Doppler study which was done to evaluate a systolic murmur in the aortic region noted in December 2013 showed normal systolic function with mild focal basal hypertrophy of the septum. He had normal systolic function. There was evidence for a moderate diffuse calcification involving the noncoronary cusp of a trileaflet aortic valve. He did have sclerosis without stenosis. Valve area was 2.55 cm. RV size was upper normal to mildly dilated. A nuclear perfusion study done in 2013 was unchanged from 6 years previously and continued to show normal perfusion.  Laboratory done by Eugene [name] in August 2015 was normal with a hemoglobin of 14.5, hematocrit 43.8.  Renal function was excellent with a BUN of 19, Cr 0.99.  Fasting glucose was 78.  Lipid studies remained excellent with a total cholesterol 125, triglycerides 14, HDL cholesterol 44, and LDL cholesterol at 60.  Angiotensin converting enzyme was normal at 37 and last year was 33.  He underwent an echo Doppler study in 03/13/2015.  This revealed an ejection fraction at 60-65%. There was grade 1 diastolic dysfunction.  He had normal LV filling pressures.  His aortic valve was calcified and there was evidence for mild aortic stenosis with a mean gradient of 9, peak gradient of 18, and valve area of 1.87 cm and mild aortic insufficiency.  There was mild mitral annular calcification with trivial MR , mild LA dilatation, and  moderate TR with PA pressure 30 mm. He underwent right knee replacement surgery by Eugene [name] in November.  Postoperatively had some swelling and lower extremity venous studies were negative.  When I saw him in 2017, he was doing well from a cardiac standpoint.   Specifically, he denied any episodes of chest pain.  He was able to play tennis again following his knee surgery.  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  He had also undergone follow-up CT imaging asked and was found to have a new irregularly-shaped nodular density in the left lower lobe, alt to represent a possible focus of active infection/inflammation.  A follow-up CT was recommended.  After a course of antimicrobial therapy.  His CT also demonstrated a stable ectatic.  Borderline aneurysmal ascending thoracic aorta at 3.9 cm.  He had evidence for coronary calcification.   Laboratory in 2017 by his primary physician showed total cholesterol was 134, triglycerides 79, HDL 43, and LDL 75 on his current dose of ezetimibe 10 mg and simvastatin 40 mg.   When I saw him in March 2019 he was remaining active and was walking on elliptical machine at least 3-4 times per week and was playing tennis 2 times per week.  He denied any chest pain or shortness of breath.  He denies palpitations.   In 2019, he was found to have  a pulmonary infiltrate for which she was evaluated by Eugene [name].  He had undergone bronchoscopy with biopsy and ultimately underwent an evaluation at [name] and this nodule had not significantly changed over the past 16 years.  I evaluated him in May 2020 and a telemedicine visit.  At that time he denied any chest pain, palpitations, presyncope or syncope.  He denied any PND orthopnea.  He had previously been followed by Eugene [name] but he had recently signed up for concierge  medicine with Dr. Derinda [name].  During that evaluation I recommended he undergo a follow-up echo Doppler study to reassess his aortic valve since in 2016 he was found to have mild aortic stenosis.  He underwent an echo Doppler study on December 21, 2018 which continues to show normal systolic function with EF at 60 to 65%, and moderate asymmetric left ventricular hypertrophy.  There was grade 2 diastolic dysfunction.  There was mild aortic stenosis with moderate calcification of the aortic valve.  He had a mean systolic gradient of 11 mm and his peak gradient was 20.8 mm.  Calculated aortic valve was 1.7 cm and was consistent with mild aortic stenosis.  On January 25, 2019 while biking at [place] he apparently had a syncopal spell.  He was found by bystanders.  Apparently he is amnesic to the event and when he woke up he was at [facility] in the emergency room.  It did not appear that he was aware when he had fallen since there was no apparent marks on his hands to break a fall.  A CT scan of the maxillofacial area demonstrated a comminuted displaced bilateral nasal bone fracture as well as fracture of the right zygomatic arch.  There was a frontal scalp hematoma.  He had extensive CT imaging of his chest abdomen and pelvis.  Of note, on his chest CT he was felt to have severe coronary artery atherosclerotic calcifications.  There was no evidence for acute traumatic injury within the chest abdomen or pelvis.  He had a 3.6 cm indeterminate liver mass, 4.3 cm indeterminate left renal lesion and had multiple bladder stones and bladder diverticula likely related to chronic outlet obstruction from an enlarged prostate and had nonobstructive bilateral nephrolithiasis.  Upon his return to [facility], he was evaluated by Eugene [name] in the [facility] primary care.  He subsequently was evaluated by Eugene [name] in our office for cardiology evaluation during my absence.  With his memory  absence, it is felt that he had a concussion.  When seen by Eugene [name], she reviewed his extensive evaluation and recent echo which confirmed that he did not have significant aortic stenosis.  Because of his remote history of sarcoidosis, she scheduled him to undergo cardiac MRI imaging to assess for scar/infiltrative disease.  He was also scheduled to undergo 14-day Ziotelemetry monitor.    I saw him in follow-up of Eugene [name] on February 14, 2019.  At the time of his evaluation he had not yet received his Zio patch monitor.  I reviewed extensively his records from [name] as well as the records of Eugene [name].  His MR scan was reviewed and did not show any definitive myocardial [name] gadolinium enhancement so there was no definitive evidence for prior myocardial infarction, infiltrative disease or myocarditis.  LVEF was approximately 60%.  His RV EF was reported at 35% however when reviewed by Eugene [name] she had felt in 20 function was somewhat better.  I scheduled  him to undergo coronary CTA to assess potential CAD disease with silent ischemia leading to his clinical event.  He also underwent carotid duplex imaging.Marland Kitchen  His 14-day ZIO monitor showed predominant sinus rhythm with right bundle branch block.  He did have an average heart rate at 62 bpm with a minimum of 42 while sleeping and maximal heart rate of 190.  He had several short bursts of SVT the fastest interval lasting 4 beats with a maximum rate of 190.  The longest SVT episode was 12.8 seconds with an average heart rate of 138 bpm.  There was no VT or atrial fibrillation or high degree block or pauses noted.  Carotid duplex imaging essentially normal with normal antegrade vertebral and subclavian flow and was without evidence for internal carotid stenoses.  I received an initial report of his CT angiogram which came back as normal.  We had notified him of the results.  Apparently, there was an addendum to the report  port when it was reinterpreted adjusted multivessel CAD with a calcium score of 1151 with moderate calcific plaque in the mid distal left main, severe calcific plaque in the mid LAD mild calcific plaque in the circumflex and RCA.  There was concern of possible significant blooming artifact which may have contributed to overestimate of LAD stenoses.  Subsequent FFR analysis was normal and did not reveal any hemodynamic significant stenoses.   I had a long conversation with Eugene [name] as well as called his daughter in [place] in follow-up of his April 08, 2019 office visit.  At that time I recommended initiation of aspirin 81 mg and initiated metoprolol succinate 12.5 mg daily.  I also discussed changing him to more aggressive lipid-lowering therapy and he recently was started on rosuvastatin 40 mg in place of simvastatin. When I last saw him in the office on April 18, 2019 I recommended definitive cardiac catheterization in light of his coronary CT angio findings also recommended insertion of a loop monitor for potential long-term monitoring of potential cardiac arrhythmia.   Mr. Abdallah underwent cardiac catheterization on May 02, 2019 by me. There was mild to moderate coronary calcification without high-grade obstructive disease. There was a smooth distal tapering of his left main with 30% narrowing, 30% proximal and 20% mid calcified stenoses in the LAD with 40% diffuse stenosis in a small caliber mid LAD vessel. The left circumflex vessel was angiographically normal and dominant. The RCA was a normal nondominant vessel. Of note, there was a small caliber ramus intermediate branch which appeared to fill directly into the left ventricle resulting in left ventricular opacification. He had only very mild aortic stenosis with a transvalvular aortic gradient at 10 to 12 mm. There was mild aortic valve calcification. There that day before going home he underwent insertion of a loop recorder by Dr.  Sallyanne Kuster.  Over the last several weeks, Mr. Girton has felt well. He denies any episodes of chest pain or shortness of breath. Loop recorder interrogation to date has not shown any events. He presents for follow-up evaluation.  Past Medical History:  Diagnosis Date  . Bladder stones   . BPH (benign prostatic hyperplasia)   . Hernia of abdominal cavity   . History of kidney stones   . Hyperlipidemia   . Incomplete right bundle branch block   . Low O2 saturation 04/04/2015   Wife says O2 sats are in the low 90s base line.  Now in the 41s.  Will do incentive spirometry  . Primary  localized osteoarthritis of right knee 04/02/2015  . Sarcoidosis    diagnosed 8-9 yrs ago  . Upper GI bleed 04/04/2015   Patient had coffee ground emesis that was Guiac positive on Monday.  EGD scheduled for 04/04/2015. Started on IV Protonix    Past Surgical History:  Procedure Laterality Date  . ANTERIOR CERVICAL DECOMP/DISCECTOMY FUSION  2007  . BRONCHOSCOPY  2007  . CARDIOVASCULAR STRESS TEST  04/29/2012   normal nuclear stress study.   . CYSTOSCOPY  2004  . DOPPLER ECHOCARDIOGRAPHY  04/29/2012   EF not noted. small perimembranous ventricular septal defect. small left to right ventricular shunt. moderate diffuse calcification involving noncoronary cusp of the aortic valve.   . ESOPHAGOGASTRODUODENOSCOPY  04/04/2015  . ESOPHAGOGASTRODUODENOSCOPY N/A 04/04/2015   Procedure: ESOPHAGOGASTRODUODENOSCOPY (EGD);  Surgeon: Manus Gunning, MD;  Location: [facility];  Service: Gastroenterology;  Laterality: N/A;  . INGUINAL HERNIA REPAIR Right 2009  . LEFT HEART CATH AND CORONARY ANGIOGRAPHY N/A 05/02/2019   Procedure: LEFT HEART CATH AND CORONARY ANGIOGRAPHY;  Surgeon: Troy Sine, MD;  Location: [facility];  Service: Cardiovascular;  Laterality: N/A;  . LOOP RECORDER INSERTION N/A 05/02/2019   Procedure: LOOP RECORDER INSERTION;  Surgeon: Sanda Klein, MD;  Location: [facility];  Service:  Cardiovascular;  Laterality: N/A;  . TOTAL KNEE ARTHROPLASTY Right 04/02/2015   Procedure: TOTAL KNEE ARTHROPLASTY;  Surgeon: Elsie Saas, MD;  Location: [facility];  Service: Orthopedics;  Laterality: Right;  Marland Kitchen VIDEO BRONCHOSCOPY Bilateral 09/23/2017   Procedure: VIDEO BRONCHOSCOPY WITH FLUORO;  Surgeon: Rigoberto Noel, MD;  Location: [facility];  Service: Cardiopulmonary;  Laterality: Bilateral;    No Known Allergies  Current Outpatient Medications  Medication Sig Dispense Refill  . aspirin EC 81 MG tablet Take 1 tablet (81 mg total) by mouth daily. 90 tablet 3  . Cholecalciferol (VITAMIN D) 50 MCG (2000 UT) CAPS Take 2,000 Units by mouth daily.     Marland Kitchen ezetimibe (ZETIA) 10 MG tablet TAKE (1) TABLET DAILY AT BEDTIME. (Patient taking differently: Take 10 mg by mouth at bedtime. ) 90 tablet 3  . finasteride (PROSCAR) 5 MG tablet Take 5 mg by mouth every evening.     . metoprolol succinate (TOPROL XL) 25 MG 24 hr tablet Take 0.5 tablets (12.5 mg total) by mouth daily. 45 tablet 6  . potassium citrate (UROCIT-K) 10 MEQ (1080 MG) SR tablet Take 10 mEq by mouth daily.    . rosuvastatin (CRESTOR) 40 MG tablet Take 40 mg by mouth daily.     No current facility-administered medications for this visit.    Social History   Socioeconomic History  . Marital status: Married    Spouse name: Not on file  . Number of children: Not on file  . Years of education: Not on file  . Highest education level: Not on file  Occupational History  . Not on file  Tobacco Use  . Smoking status: Former Smoker    Types: Pipe    Quit date: 05/26/1972    Years since quitting: 47.0  . Smokeless tobacco: Never Used  . Tobacco comment: Quit 43 year  Substance and Sexual Activity  . Alcohol use: Yes    Alcohol/week: 4.0 standard drinks    Types: 4 Standard drinks or equivalent per week  . Drug use: No  . Sexual activity: Not on file  Other Topics Concern  . Not on file  Social History Narrative  . Not on file  Social Determinants of Health   Financial Resource Strain:   . Difficulty of Paying Living Expenses: Not on file  Food Insecurity:   . Worried About Charity fundraiser in the Last Year: Not on file  . Ran Out of Food in the Last Year: Not on file  Transportation Needs:   . Lack of Transportation (Medical): Not on file  . Lack of Transportation (Non-Medical): Not on file  Physical Activity:   . Days of Exercise per Week: Not on file  . Minutes of Exercise per Session: Not on file  Stress:   . Feeling of Stress : Not on file  Social Connections:   . Frequency of Communication with Friends and Family: Not on file  . Frequency of Social Gatherings with Friends and Family: Not on file  . Attends Religious Services: Not on file  . Active Member of Clubs or Organizations: Not on file  . Attends Archivist Meetings: Not on file  . Marital Status: Not on file  Intimate Partner Violence:   . Fear of Current or Ex-Partner: Not on file  . Emotionally Abused: Not on file  . Physically Abused: Not on file  . Sexually Abused: Not on file   Socially he is married. He is regional and 9 grandchildren. One son lives in [place], or lives in [place]. There is no tobacco use. He does take occasional alcohol. He does exercise.  Family History  Problem Relation Age of Onset  . Ovarian cancer Mother   . Heart attack Father 100    ROS General: Negative; No fevers, chills, or night sweats;  HEENT: Negative; No changes in vision or hearing, sinus congestion, difficulty swallowing Pulmonary: Negative; No cough, wheezing, shortness of breath, hemoptysis Cardiovascular: Negative; No chest pain, presyncope, syncope, palpitations GI: Negative; No nausea, vomiting, diarrhea, or abdominal pain GU: Negative; No dysuria, hematuria, or difficulty voiding Musculoskeletal: Recent bilateral nasal bone fractures and fracture of right zygomatic arch Hematologic/Oncology: Negative; no easy bruising,  bleeding Remote history of sarcoidosis, completely resolved Endocrine: Negative; no heat/cold intolerance; no diabetes Neuro: Amnesic to his syncopal spell Skin: Negative; No rashes or skin lesions Psychiatric: Negative; No behavioral problems, depression Sleep: Negative; No snoring, daytime sleepiness, hypersomnolence, bruxism, restless legs, hypnogognic hallucinations, no cataplexy Other comprehensive 14 point system review is negative.   PE BP 97/62   Pulse 60   Temp (!) 97 F (36.1 C)   Ht '5\' 9"'$  (1.753 m)   Wt 195 lb (88.5 kg)   BMI 28.80 kg/m    Pete blood pressure by me was 106/64  Wt Readings from Last 3 Encounters:  05/19/19 195 lb (88.5 kg)  05/02/19 190 lb (86.2 kg)  04/18/19 190 lb (86.2 kg)   General: Alert, oriented, no distress.  Skin: normal turgor, no rashes, warm and dry HEENT: Normocephalic, atraumatic. Pupils equal round and reactive to light; sclera anicteric; extraocular muscles intact;  Nose without nasal septal hypertrophy Mouth/Parynx benign; Mallinpatti scale 2 Neck: No JVD, no carotid bruits; normal carotid upstroke Lungs: clear to ausculatation and percussion; no wheezing or rales Chest wall: without tenderness to palpitation Heart: PMI not displaced, RRR, s1 s2 normal, 2/6 early peaking systolic murmur consistent with his mild aortic stenosis, no diastolic murmur, no rubs, gallops, thrills, or heaves Abdomen: soft, nontender; no hepatosplenomehaly, BS+; abdominal aorta nontender and not dilated by palpation. Back: no CVA tenderness Pulses 2+; right radial site stable Musculoskeletal: full range of motion, normal strength, no joint deformities Extremities: no  clubbing cyanosis or edema, Homan's sign negative  Neurologic: grossly nonfocal; Cranial nerves grossly wnl Psychologic: Normal mood and affect   ECG (independently read by me): Sinus bradycardia at 54 bpm, right bundle branch block with repolarization changes.  Left axis deviation.   Borderline voltage criteria for LVH  April 08, 2019 ECG (independently read by me): Sinus rhythm at 65 bpm, left axis deviation, right bundle branch block with repolarization changes.  LVH by voltage.  QTc interval 453 ms.  PR interval 146 ms.  September 2020 ECG (independently read by me): Sinus bradycardia at 53 bpm, right bundle branch block with repolarization changes.  QRS duration 156 ms.  Mild LVH by voltage in aVL.  QTc interval 439 ms  March 2019 ECG (independently read by me): normal sinus rhythm at 65 bpm.  One isolated PVC.  Incomplete right bundle branch block.  Borderline LVH.  Normal intervals.  February 2018 ECG (independently read by me): Sinus bradycardia at 56 bpm.  Mild sinus arrhythmia.  Mild LVH.  Normal intervals.  No ST segment changes.  February 2017 ECG (independently read by me):  Normal sinus rhythm at 63 bpm.  Early transition.  No ST segment changes.  December 2015 ECG (independently read by me): Normal sinus rhythm at 66 bpm.  Mild RV conduction delay.  Early transition.  December 2014 ECG: Sinus rhythm with an occasional PVC; normal intervals.  LABS: I personally reviewed the laboratory from [facility] done on 02/19/2016.  BUN 18, creatinine 0.9.  Ingrown 14.7, hematocrit 44.9.  Normal LFTs.  TSH 2.08.  April lipoprotein B 66.  Normal PSA.  Lipid studies as noted above.  Lpid studies from March 03, 2017: Total cholesterol 134, HDL 39, LDL 70, triglycerides 124.    BMP Latest Ref Rng & Units 04/28/2019 02/24/2019 01/26/2019  Glucose 65 - 99 mg/dL 106(H) 88 83  BUN 8 - 27 mg/dL '21 21 22  '$ Creatinine 0.76 - 1.27 mg/dL 0.91 1.02 1.15  BUN/Creat Ratio 10 - '24 23 21 '$ -  Sodium 134 - 144 mmol/L 139 139 137  Potassium 3.5 - 5.2 mmol/L 4.9 4.8 4.2  Chloride 96 - 106 mmol/L 102 102 102  CO2 20 - 29 mmol/L '27 25 25  '$ Calcium 8.6 - 10.2 mg/dL 10.0 10.3(H) 9.7   Hepatic Function Latest Ref Rng & Units 03/20/2015 08/21/2014  Total Protein 6.5 - 8.1 g/dL  6.6 7.0  Albumin 3.5 - 5.0 g/dL 4.0 4.1  AST 15 - 41 U/L 29 32  ALT 17 - 63 U/L 30 32  Alk Phosphatase 38 - 126 U/L 68 73  Total Bilirubin 0.3 - 1.2 mg/dL 0.8 0.7   CBC Latest Ref Rng & Units 04/28/2019 01/26/2019 04/06/2015  WBC 3.4 - 10.8 x10E3/uL 9.5 10.8(H) 11.3(H)  Hemoglobin 13.0 - 17.7 g/dL 14.5 14.3 8.9(L)  Hematocrit 37.5 - 51.0 % 44.1 43.9 27.1(L)  Platelets 150 - 450 x10E3/uL 187 176 152   Lab Results  Component Value Date   MCV 91 04/28/2019   MCV 92.8 01/26/2019   MCV 91.2 04/06/2015   Additional studies reviewed: I reviewed all [facility] including his CT images of January 25, 2019 following his recent syncopal spell induced  Trauma.  ECHO 7//28/2020 IMPRESSIONS  1. The left ventricle has normal systolic function with an ejection fraction of 60-65%. The cavity size was normal. There is moderate asymmetric left ventricular hypertrophy. Left ventricular diastolic Doppler parameters are consistent with  pseudonormalization.  2.  The right ventricle has normal systolic function. The cavity was normal. There is no increase in right ventricular wall thickness. Right ventricular systolic pressure could not be assessed.  3. Left atrial size was mildly dilated.  4. The aortic valve is abnormal. Moderate calcification of the aortic valve. Aortic valve regurgitation is trivial by color flow Doppler. Mild stenosis with a mean systolic gradient of 11 mmHg of the aortic valve.  5. The aorta is normal in size and structure when indexed to body surface area (ascending aorta measures 38 mm).  CARDIAC MRI 02/11/2019 IMPRESSION: 1. Normal left ventricular size with mild LV hypertrophy. This appears concentric and not asymmetric. EF 60%, normal wall motion.  2. The RV is mildly dilated with mild to moderate hypokinesis, EF 35%.  3. There is no definite myocardial LGE, so no definitive evidence for prior MI, infiltrative disease, or  myocarditis.  Abnormal right ventricle. However, there is no definitive evidence for cardiac sarcoidosis or hypertrophic cardiomyoapthy.  ----------------------------------------------------------------------------------------------------------- CTA CORONARY :  IMPRESSION: 03/07/2019  1. Coronary calcium score of 0. This was 0 percentile for age and sex matched control.  2. Normal coronary origin with right dominance.  3. No evidence of CAD.  Fransico Him  ADDENDUM/ Edited Result :  CTA  Aorta: Normal size. Calcifications of the aortic root and ascending aorta. No dissection.  Aortic Valve:  Trileaflet.  Moderate calcifications.  Coronary Arteries:  Normal coronary origin.  Left dominance.  RCA is a small non dominant artery that gives rise to PDA and PLVB. There is mild calcified plaque in the proximal RCA with associated stenosis of 25-49%. There is motion artifact in the mid and distal RCA.  Left main is a large artery that gives rise to LAD and LCX arteries. There is moderate calcified plaque in the mid to distal LM with associated 50-69% stenosis.  LAD is a large vessel that gives rise to a large D1 and moderate sized D2. There is moderate calcified plaque in the proximal LAD with associated 50-69% stenosis. There is severe calcified plaque in the mid LAD with associated stenosis of 70-99%.  LCX is a large dominant artery that gives rise to one large OM1 branch. There is minimal calcified plaque in the proximal to mid LCx with associated stenosis of 0-24%. There is mild calcified plaque in the mid LCx at the takeoff of a moderate sized branching OM with associated stenosis of 25-49%. There is minimal atherosclerosis of the distal LCx with associated stenosis of 0 - 24%.  Other findings:  Normal pulmonary vein drainage into the left atrium.  Normal let atrial appendage without a thrombus.  Normal size of the pulmonary artery.  IMPRESSION: 1.  Coronary calcium score of 1151. This was 66th percentile for age and sex matched control.  2.  Normal coronary origin with left dominance.  3.  Moderate to severe calcified AV cusps.  4.  Atherosclerosis of the aortic root and ascending aorta.  5. Moderate Calcified plaque in the distal LM and severe calcified plaque in the mid LAD. CAD-RADS 4b. Significant blooming may over estimate LAD stenosis.  6.  Recommend aggressive risk factor modification.  7.  Recommend cardiac catheterization.  8.  Study has been sent for [facility] analysis.  Fransico Him  ----------------------------------------------------------------------------------------- FFR FINDINGS: FFRct analysis was performed on the original cardiac CT angiogram dataset. Diagrammatic representation of the FFRct analysis is provided in a separate PDF document in PACS. This dictation was created using the PDF document and an interactive  3D model of the results. 3D model is not available in the EMR/PACS. Normal FFR range is >0.80.  1. No significant flow limiting lesion: LM FFR = 0.99.  2. LAD: No significant flow limiting lesion: Proximal FFR = 0.98, Mid FFR = 0.96, Distal FFR = 0.89.  3. LCX: No significant flow limiting lesion: Proximal FFR = 0.97, Mid FFR = 0.94, Distal FFR = 0.91.  4. RCA: No flow limiting lesion in non dominant small RCA: Proximal FFR = 1.00, Mid FFR = 0.97, distal FFR not obtained.  IMPRESSION: 1. CT FFR analysis show no hemodynamically significant flow limiting lesions.  ------------------------------------------------------------------------------------------- CARDIAC CATH 05/02/2019  Mid LM to Dist LM lesion is 30% stenosed.  Prox LAD lesion is 30% stenosed.  Mid LAD lesion is 20% stenosed.  Dist LAD lesion is 40% stenosed.   There is evidence for mild to moderate coronary calcification without high-grade obstructive disease.  There is smooth distal tapering of the left main  with 30% narrowing; 30% proximal and 20% mid ossified stenoses in the LAD with 40% diffuse stenosis in a small caliber mid LAD vessel.  The left circumflex vessel is an angiographically normal dominant vessel; the RCA is normal nondominant vessel.  There is a small caliber ramus intermediate branch which appears to fill directly into the left ventricle resulting in ventricular opacification,  Mild transvalvular aortic gradient at 10 to 26mHg going from the left ventricle to the aorta consistent with his known very mild aortic valve stenosis.  There is mild aortic valve calcification.  RECOMMENDATION:  The patient will continue on aspirin therapy as well as his low-dose beta-blocker therapy.  A loop recorder will be placed in the short stay following his catheterization with careful close monitoring of his rhythm status.  Continue aggressive lipid-lowering therapy with target LDL ideally in the 50s or below.  The patient was recently switched from simvastatin to rosuvastatin 40 mg.       IMPRESSION:  1. Syncope and collapse   2. Calcification of coronary artery   3. Hyperlipidemia with target LDL less than 70   4. Paroxysmal SVT (supraventricular tachycardia) ([facility]   5. RBBB (right bundle branch block with left anterior fascicular block)   6. Mild aortic stenosis     ASSESSMENT AND PLAN: Mr. MGreenlawis an 85year old gentleman who has a remote history of prior sarcoid disease with ultimate complete remisson.  He has a history of mild hyperlipidemia which has been aggressively controlled.  Remotely, he was found to have mild coronary calcification on CT imaging.  I have been aggressive with lipid therapy in attempt to potentially induce plaque regression and he has been on Zetia/simvastatin 10/40 with LDL cholesterols previously documented to be 70.  In 2016 he was found to have aortic stenosis which was mild with mild aortic insufficiency with a mean gradient of 9 and peak gradient of 18.   Recently, while [place] his bike at the beach, he apparently had a syncopal spell.  He has no recollection of falling and [name]  It does not appear that he had any significant awareness of rhythm disturbance and apparently when EMS arrived he must have had 6 stable cardiac rhythm.  In July 2020  a follow-up echo Doppler study which still confirmed just mild aortic stenosis with a valve area of 1.7 cm.  It is unlikely that the severity of his aortic stenosis accounts for his syncope.  Recently, his ECG showed  definite right bundle branch block which in the past had just been incomplete right bundle.  On his ECG of February 14, 2019 he was in sinus rhythm, although bradycardic at 53 with right bundle branch block.  QTc interval is normal.  I was concerned potential ischemic etiology scheduled him to undergo a coronary CTA for further evaluation of potential significant coronary obstructive disease. I  also scheduling him for carotid duplex imaging to assess his carotid arteries and vertebral flow.  His Zio patch findings demonstrated predominant sinus rhythm but revealed several episodes of supraventricular tachycardia, as well as episodes of isolated ectopic in isolated ventricular ectopy.  His fastest heartbeat was 190 bpm which lasted 4 beats and was consistent with SVT.  The longest SVT episode was 12.8 seconds with an average rate of 138 bpm. 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 I reviewed his definitive cardiac catheterization findings with he and his wife today in detail. He had mild nonobstructive CAD and I suspect there was significant blooming artifact from the calcification leading to the CTA suggesting higher percent stenoses. He said interrogation of his loop recorder on December 22 has not shown any events. He is doing well and blood pressure is stable on his low-dose Toprol-XL 12.5 mg daily.  He is now on rosuvastatin 40 mg in addition to Zetia with aggressive therapy hopefully to induce LDL cholesterol in the 50s or below to be increased likelihood for plaque regression. I have recommended follow-up laboratory in 2 to 3 months with this medication change. He is scheduled to undergo a follow-up echo Doppler study in several months and I will see him back in the office for follow-up evaluation.   Troy Sine, MD, [name]  05/20/2019 9:41 AM

## 2019-05-20 ENCOUNTER — Encounter: Payer: Self-pay | Admitting: Cardiovascular Disease

## 2019-06-07 ENCOUNTER — Ambulatory Visit (INDEPENDENT_AMBULATORY_CARE_PROVIDER_SITE_OTHER): Payer: Medicare Other | Admitting: *Deleted

## 2019-06-07 DIAGNOSIS — R55 Syncope and collapse: Secondary | ICD-10-CM | POA: Diagnosis not present

## 2019-06-08 DIAGNOSIS — Z23 Encounter for immunization: Secondary | ICD-10-CM | POA: Diagnosis not present

## 2019-06-08 LAB — CUP PACEART REMOTE DEVICE CHECK
Date Time Interrogation Session: 20210113062153
Implantable Pulse Generator Implant Date: 20201207

## 2019-06-30 DIAGNOSIS — R55 Syncope and collapse: Secondary | ICD-10-CM | POA: Diagnosis not present

## 2019-06-30 DIAGNOSIS — I471 Supraventricular tachycardia: Secondary | ICD-10-CM | POA: Diagnosis not present

## 2019-06-30 DIAGNOSIS — E785 Hyperlipidemia, unspecified: Secondary | ICD-10-CM | POA: Diagnosis not present

## 2019-06-30 LAB — TSH: TSH: 2.07 u[IU]/mL (ref 0.450–4.500)

## 2019-06-30 LAB — CBC
Hematocrit: 43.6 % (ref 37.5–51.0)
Hemoglobin: 14.1 g/dL (ref 13.0–17.7)
MCH: 29.7 pg (ref 26.6–33.0)
MCHC: 32.3 g/dL (ref 31.5–35.7)
MCV: 92 fL (ref 79–97)
Platelets: 146 10*3/uL — ABNORMAL LOW (ref 150–450)
RBC: 4.74 x10E6/uL (ref 4.14–5.80)
RDW: 13.1 % (ref 11.6–15.4)
WBC: 6.7 10*3/uL (ref 3.4–10.8)

## 2019-06-30 LAB — COMPREHENSIVE METABOLIC PANEL
ALT: 54 IU/L — ABNORMAL HIGH (ref 0–44)
AST: 45 IU/L — ABNORMAL HIGH (ref 0–40)
Albumin/Globulin Ratio: 1.9 (ref 1.2–2.2)
Albumin: 4.3 g/dL (ref 3.6–4.6)
Alkaline Phosphatase: 73 IU/L (ref 39–117)
BUN/Creatinine Ratio: 16 (ref 10–24)
BUN: 16 mg/dL (ref 8–27)
Bilirubin Total: 0.7 mg/dL (ref 0.0–1.2)
CO2: 23 mmol/L (ref 20–29)
Calcium: 10 mg/dL (ref 8.6–10.2)
Chloride: 103 mmol/L (ref 96–106)
Creatinine, Ser: 1.03 mg/dL (ref 0.76–1.27)
GFR calc Af Amer: 77 mL/min/{1.73_m2} (ref >59)
GFR calc non Af Amer: 66 mL/min/{1.73_m2} (ref >59)
Globulin, Total: 2.3 g/dL (ref 1.5–4.5)
Glucose: 88 mg/dL (ref 65–99)
Potassium: 4.8 mmol/L (ref 3.5–5.2)
Sodium: 138 mmol/L (ref 134–144)
Total Protein: 6.6 g/dL (ref 6.0–8.5)

## 2019-06-30 LAB — LIPID PANEL
Chol/HDL Ratio: 2.1 ratio (ref 0.0–5.0)
Cholesterol, Total: 89 mg/dL — ABNORMAL LOW (ref 100–199)
HDL: 43 mg/dL (ref >39)
LDL Chol Calc (NIH): 30 mg/dL (ref 0–99)
Triglycerides: 73 mg/dL (ref 0–149)
VLDL Cholesterol Cal: 16 mg/dL (ref 5–40)

## 2019-07-05 ENCOUNTER — Ambulatory Visit (HOSPITAL_COMMUNITY): Payer: Medicare Other | Attending: Cardiovascular Disease

## 2019-07-05 ENCOUNTER — Other Ambulatory Visit: Payer: Self-pay

## 2019-07-05 DIAGNOSIS — Z23 Encounter for immunization: Secondary | ICD-10-CM | POA: Diagnosis not present

## 2019-07-05 DIAGNOSIS — I350 Nonrheumatic aortic (valve) stenosis: Secondary | ICD-10-CM | POA: Insufficient documentation

## 2019-07-08 ENCOUNTER — Encounter: Payer: Self-pay | Admitting: Cardiovascular Disease

## 2019-07-08 ENCOUNTER — Ambulatory Visit (INDEPENDENT_AMBULATORY_CARE_PROVIDER_SITE_OTHER): Payer: Medicare Other | Admitting: Cardiovascular Disease

## 2019-07-08 ENCOUNTER — Other Ambulatory Visit: Payer: Self-pay

## 2019-07-08 ENCOUNTER — Ambulatory Visit (INDEPENDENT_AMBULATORY_CARE_PROVIDER_SITE_OTHER): Payer: Medicare Other | Admitting: *Deleted

## 2019-07-08 VITALS — BP 135/71 | HR 62 | Temp 96.4°F | Ht 69.0 in | Wt 192.6 lb

## 2019-07-08 DIAGNOSIS — I350 Nonrheumatic aortic (valve) stenosis: Secondary | ICD-10-CM

## 2019-07-08 DIAGNOSIS — I452 Bifascicular block: Secondary | ICD-10-CM | POA: Diagnosis not present

## 2019-07-08 DIAGNOSIS — R55 Syncope and collapse: Secondary | ICD-10-CM | POA: Diagnosis not present

## 2019-07-08 DIAGNOSIS — I471 Supraventricular tachycardia: Secondary | ICD-10-CM | POA: Diagnosis not present

## 2019-07-08 DIAGNOSIS — E7800 Pure hypercholesterolemia, unspecified: Secondary | ICD-10-CM

## 2019-07-08 DIAGNOSIS — R931 Abnormal findings on diagnostic imaging of heart and coronary circulation: Secondary | ICD-10-CM | POA: Diagnosis not present

## 2019-07-08 DIAGNOSIS — I2510 Atherosclerotic heart disease of native coronary artery without angina pectoris: Secondary | ICD-10-CM

## 2019-07-08 LAB — CUP PACEART REMOTE DEVICE CHECK
Date Time Interrogation Session: 20210211230444
Implantable Pulse Generator Implant Date: 20201207

## 2019-07-08 NOTE — Progress Notes (Signed)
ILR Remote 

## 2019-07-08 NOTE — Progress Notes (Signed)
Patient ID: Eugene [name], male   DOB: [birth year], 86 y.o.   MRN: [id number]    Primary MD: Dr. Derinda Late  HPI: Eugene [name] is a 86 y.o. male who presents to the office today for a 2 month cardiology evaluation.  Eugene [name] has a remote history of sarcoidosis and had been followed at [facility] with complete remission. He has a history of mild hyperlipidemia and has been on Vytorin 10/40. He also has a history of BPH on Proscar.  An echo Doppler study which was done to evaluate a systolic murmur in the aortic region noted in December 2013 showed normal systolic function with mild focal basal hypertrophy of the septum. He had normal systolic function. There was evidence for a moderate diffuse calcification involving the noncoronary cusp of a trileaflet aortic valve. He did have sclerosis without stenosis. Valve area was 2.55 cm. RV size was upper normal to mildly dilated. A nuclear perfusion study done in 2013 was unchanged from 6 years previously and continued to show normal perfusion.  Laboratory done by Dr. Deforest Hoyles in August 2015 was normal with a hemoglobin of 14.5, hematocrit 43.8.  Renal function was excellent with a BUN of 19, Cr 0.99.  Fasting glucose was 78.  Lipid studies remained excellent with a total cholesterol 125, triglycerides 14, HDL cholesterol 44, and LDL cholesterol at 60.  Angiotensin converting enzyme was normal at 37 and last year was 33.  He underwent an echo Doppler study in 03/13/2015.  This revealed an ejection fraction at 60-65%. There was grade 1 diastolic dysfunction.  He had normal LV filling pressures.  His aortic valve was calcified and there was evidence for mild aortic stenosis with a mean gradient of 9, peak gradient of 18, and valve area of 1.87 cm and mild aortic insufficiency.  There was mild mitral annular calcification with trivial MR , mild LA dilatation, and moderate TR with PA pressure 30 mm. He underwent right knee replacement  surgery by Dr. Moshe Salisbury in November.  Postoperatively had some swelling and lower extremity venous studies were negative.  When I saw him in 2017, he was doing well from a cardiac standpoint.   Specifically, he denied any episodes of chest pain.  He was able to play tennis again following his knee surgery.  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  He had also undergone follow-up CT imaging asked and was found to have a new irregularly-shaped nodular density in the left lower lobe, alt to represent a possible focus of active infection/inflammation.  A follow-up CT was recommended.  After a course of antimicrobial therapy.  His CT also demonstrated a stable ectatic.  Borderline aneurysmal ascending thoracic aorta at 3.9 cm.  He had evidence for coronary calcification.   Laboratory in 2017 by his primary physician showed total cholesterol was 134, triglycerides 79, HDL 43, and LDL 75 on his current dose of ezetimibe 10 mg and simvastatin 40 mg.   When I saw him in March 2019 he was remaining active and was walking on elliptical machine at least 3-4 times per week and was playing tennis 2 times per week.  He denied any chest pain or shortness of breath.  He denies palpitations.   In 2019, he was found to have a pulmonary infiltrate for which she was evaluated by  Dr. Elsworth Soho.  He had undergone bronchoscopy with biopsy and ultimately underwent an evaluation at [facility] and this nodule had not significantly changed over the past 16 years.  I evaluated him in May 2020 and a telemedicine visit.  At that time he denied any chest pain, palpitations, presyncope or syncope.  He denied any PND orthopnea.  He had previously been followed by Dr. Ardeth Perfect but he had recently signed up for concierge medicine with Dr. Derinda Late.  During that evaluation I recommended he  undergo a follow-up echo Doppler study to reassess his aortic valve since in 2016 he was found to have mild aortic stenosis.  He underwent an echo Doppler study on December 21, 2018 which continues to show normal systolic function with EF at 60 to 65%, and moderate asymmetric left ventricular hypertrophy.  There was grade 2 diastolic dysfunction.  There was mild aortic stenosis with moderate calcification of the aortic valve.  He had a mean systolic gradient of 11 mm and his peak gradient was 20.8 mm.  Calculated aortic valve was 1.7 cm and was consistent with mild aortic stenosis.  On January 25, 2019 while biking at [place] he apparently had a syncopal spell.  He was found by bystanders.  Apparently he is amnesic to the event and when he woke up he was at [facility] in the emergency room.  It did not appear that he was aware when he had fallen since there was no apparent marks on his hands to break a fall.  A CT scan of the maxillofacial area demonstrated a comminuted displaced bilateral nasal bone fracture as well as fracture of the right zygomatic arch.  There was a frontal scalp hematoma.  He had extensive CT imaging of his chest abdomen and pelvis.  Of note, on his chest CT he was felt to have severe coronary artery atherosclerotic calcifications.  There was no evidence for acute traumatic injury within the chest abdomen or pelvis.  He had a 3.6 cm indeterminate liver mass, 4.3 cm indeterminate left renal lesion and had multiple bladder stones and bladder diverticula likely related to chronic outlet obstruction from an enlarged prostate and had nonobstructive bilateral nephrolithiasis.  Upon his return to [facility], he was evaluated by Hulan Saas in the [facility] primary care.  He subsequently was evaluated by Dr. Al Pimple in our office for cardiology evaluation during my absence.  With his memory absence, it is felt that he had a concussion.  When seen by Dr. Harrell Gave,  she reviewed his extensive evaluation and recent echo which confirmed that he did not have significant aortic stenosis.  Because of his remote history of sarcoidosis, she scheduled him to undergo cardiac MRI imaging to assess for scar/infiltrative disease.  He was also scheduled to undergo 14-day Ziotelemetry monitor.    I saw him in follow-up of Dr. Harrell Gave on February 14, 2019.  At the time of his evaluation he had not yet received his Zio patch monitor.  I reviewed extensively his records from [facility] as well as the records of Dr. Harrell Gave.  His MR scan was reviewed and did not show any definitive myocardial late gadolinium enhancement so there was no definitive evidence for prior myocardial infarction, infiltrative disease or myocarditis.  LVEF was approximately 60%.  His RV EF was reported at 35% however when reviewed by Dr. Harrell Gave she had felt in 20 function was somewhat better.  I scheduled him to undergo coronary CTA to assess potential CAD  disease with silent ischemia leading to his clinical event.  He also underwent carotid duplex imaging.Marland Kitchen  His 14-day ZIO monitor showed predominant sinus rhythm with right bundle branch block.  He did have an average heart rate at 62 bpm with a minimum of 42 while sleeping and maximal heart rate of 190.  He had several short bursts of SVT the fastest interval lasting 4 beats with a maximum rate of 190.  The longest SVT episode was 12.8 seconds with an average heart rate of 138 bpm.  There was no VT or atrial fibrillation or high degree block or pauses noted.  Carotid duplex imaging essentially normal with normal antegrade vertebral and subclavian flow and was without evidence for internal carotid stenoses.  I received an initial report of his CT angiogram which came back as normal.  We had notified him of the results.  Apparently, there was an addendum to the report port when it was reinterpreted adjusted multivessel CAD with a calcium score  of 1151 with moderate calcific plaque in the mid distal left main, severe calcific plaque in the mid LAD mild calcific plaque in the circumflex and RCA.  There was concern of possible significant blooming artifact which may have contributed to overestimate of LAD stenoses.  Subsequent FFR analysis was normal and did not reveal any hemodynamic significant stenoses.   I had a long conversation with Eugene [name] as well as called his daughter in [place] in follow-up of his April 08, 2019 office visit.  At that time I recommended initiation of aspirin 81 mg and initiated metoprolol succinate 12.5 mg daily.  I also discussed changing him to more aggressive lipid-lowering therapy and he recently was started on rosuvastatin 40 mg in place of simvastatin. When I last saw him in the office on April 18, 2019 I recommended definitive cardiac catheterization in light of his coronary CT angio findings also recommended insertion of a loop monitor for potential long-term monitoring of potential cardiac arrhythmia.   Eugene [name] underwent cardiac catheterization on May 02, 2019 by me. There was mild to moderate coronary calcification without high-grade obstructive disease. There was a smooth distal tapering of his left main with 30% narrowing, 30% proximal and 20% mid calcified stenoses in the LAD with 40% diffuse stenosis in a small caliber mid LAD vessel. The left circumflex vessel was angiographically normal and dominant. The RCA was a normal nondominant vessel. Of note, there was a small caliber ramus intermediate branch which appeared to fill directly into the left ventricle resulting in left ventricular opacification. He had only very mild aortic stenosis with a transvalvular aortic gradient at 10 to 12 mm. There was mild aortic valve calcification. There that day before going home he underwent insertion of a loop recorder by Dr. Sallyanne Kuster.  I saw him in follow-up of his catheterization and loop recorder on May 18, 2020.  Over the last several weeks, Eugene [name] has felt well. He denies any episodes of chest pain or shortness of breath. Loop recorder interrogation to date has not shown any events.   Since I  saw him in December 2020, he has been undergoing monthly device checks of his loop recorder.  He has normal device function.  There are no new symptom episodes or episodes of tachycardia, bradycardia or pauses.  There were no new AF episodes.  He underwent a follow-up echo Doppler study on July 05, 2019.  This essentially is unchanged from his prior evaluation and continues to show hyperdynamic LV  function.  He has grade 1 diastolic dysfunction.  There is decreased motion of his left and noncoronary cusp of a trileaflet aortic valve.  Aortic stenosis is mild with a mean gradient of 10.5 and peak gradient of 18.8.  There is moderate aortic insufficiency.  Ascending aorta is mildly dilated at 40 mm.  There is moderate tricuspid regurgitation.  Presently, he feels well.  He denies any chest pain, PND or orthopnea.  There is no presyncope or syncope.  He denies any awareness of palpitations.  He underwent recent laboratory now on his regimen of Zetia plus rosuvastatin 40 mg, and LDL cholesterol is now markedly reduced from 72 down to 30 with combination treatment.  There is minimally elevated AST and ALT levels of 45 and 5 respectively.  He remains asymptomatic.   Past Medical History:  Diagnosis Date  . Bladder stones   . BPH (benign prostatic hyperplasia)   . Hernia of abdominal cavity   . History of kidney stones   . Hyperlipidemia   . Incomplete right bundle branch block   . Low O2 saturation 04/04/2015   Wife says O2 sats are in the low 90s base line.  Now in the 61s.  Will do incentive spirometry  . Primary localized osteoarthritis of right knee 04/02/2015  . Sarcoidosis    diagnosed 8-9 yrs ago  . Upper GI bleed 04/04/2015   Patient had coffee ground emesis that was Guiac positive on Monday.  EGD  scheduled for 04/04/2015. Started on IV Protonix    Past Surgical History:  Procedure Laterality Date  . ANTERIOR CERVICAL DECOMP/DISCECTOMY FUSION  2007  . BRONCHOSCOPY  2007  . CARDIOVASCULAR STRESS TEST  04/29/2012   normal nuclear stress study.   . CYSTOSCOPY  2004  . DOPPLER ECHOCARDIOGRAPHY  04/29/2012   EF not noted. small perimembranous ventricular septal defect. small left to right ventricular shunt. moderate diffuse calcification involving noncoronary cusp of the aortic valve.   . ESOPHAGOGASTRODUODENOSCOPY  04/04/2015  . ESOPHAGOGASTRODUODENOSCOPY N/A 04/04/2015   Procedure: ESOPHAGOGASTRODUODENOSCOPY (EGD);  Surgeon: Manus Gunning, MD;  Location: [facility];  Service: Gastroenterology;  Laterality: N/A;  . INGUINAL HERNIA REPAIR Right 2009  . LEFT HEART CATH AND CORONARY ANGIOGRAPHY N/A 05/02/2019   Procedure: LEFT HEART CATH AND CORONARY ANGIOGRAPHY;  Surgeon: Troy Sine, MD;  Location: [facility];  Service: Cardiovascular;  Laterality: N/A;  . LOOP RECORDER INSERTION N/A 05/02/2019   Procedure: LOOP RECORDER INSERTION;  Surgeon: Sanda Klein, MD;  Location: [facility];  Service: Cardiovascular;  Laterality: N/A;  . TOTAL KNEE ARTHROPLASTY Right 04/02/2015   Procedure: TOTAL KNEE ARTHROPLASTY;  Surgeon: Elsie Saas, MD;  Location: [facility];  Service: Orthopedics;  Laterality: Right;  Marland Kitchen VIDEO BRONCHOSCOPY Bilateral 09/23/2017   Procedure: VIDEO BRONCHOSCOPY WITH FLUORO;  Surgeon: Rigoberto Noel, MD;  Location: [facility];  Service: Cardiopulmonary;  Laterality: Bilateral;    No Known Allergies  Current Outpatient Medications  Medication Sig Dispense Refill  . aspirin EC 81 MG tablet Take 1 tablet (81 mg total) by mouth daily. 90 tablet 3  . Cholecalciferol (VITAMIN D) 50 MCG (2000 UT) CAPS Take 2,000 Units by mouth daily.     Marland Kitchen ezetimibe (ZETIA) 10 MG tablet TAKE (1) TABLET DAILY AT BEDTIME. (Patient taking differently: Take 10 mg by mouth at  bedtime. ) 90 tablet 3  . finasteride (PROSCAR) 5 MG tablet Take 5 mg by mouth every evening.     . metoprolol succinate (TOPROL  XL) 25 MG 24 hr tablet Take 0.5 tablets (12.5 mg total) by mouth daily. 45 tablet 6  . potassium citrate (UROCIT-K) 10 MEQ (1080 MG) SR tablet Take 10 mEq by mouth daily.    . rosuvastatin (CRESTOR) 40 MG tablet Take 40 mg by mouth daily.     No current facility-administered medications for this visit.    Social History   Socioeconomic History  . Marital status: Married    Spouse name: Not on file  . Number of children: Not on file  . Years of education: Not on file  . Highest education level: Not on file  Occupational History  . Not on file  Tobacco Use  . Smoking status: Former Smoker    Types: Pipe    Quit date: 05/26/1972    Years since quitting: 47.1  . Smokeless tobacco: Never Used  . Tobacco comment: Quit 43 year  Substance and Sexual Activity  . Alcohol use: Yes    Alcohol/week: 4.0 standard drinks    Types: 4 Standard drinks or equivalent per week  . Drug use: No  . Sexual activity: Not on file  Other Topics Concern  . Not on file  Social History Narrative  . Not on file   Social Determinants of Health   Financial Resource Strain:   . Difficulty of Paying Living Expenses: Not on file  Food Insecurity:   . Worried About Charity fundraiser in the Last Year: Not on file  . Ran Out of Food in the Last Year: Not on file  Transportation Needs:   . Lack of Transportation (Medical): Not on file  . Lack of Transportation (Non-Medical): Not on file  Physical Activity:   . Days of Exercise per Week: Not on file  . Minutes of Exercise per Session: Not on file  Stress:   . Feeling of Stress : Not on file  Social Connections:   . Frequency of Communication with Friends and Family: Not on file  . Frequency of Social Gatherings with Friends and Family: Not on file  . Attends Religious Services: Not on file  . Active Member of Clubs or  Organizations: Not on file  . Attends Archivist Meetings: Not on file  . Marital Status: Not on file  Intimate Partner Violence:   . Fear of Current or Ex-Partner: Not on file  . Emotionally Abused: Not on file  . Physically Abused: Not on file  . Sexually Abused: Not on file   Socially he is married. He is regional and 9 grandchildren. One son lives in [place], or lives in [place]. There is no tobacco use. He does take occasional alcohol. He does exercise.  Family History  Problem Relation Age of Onset  . Ovarian cancer Mother   . Heart attack Father 100    ROS General: Negative; No fevers, chills, or night sweats;  HEENT: Negative; No changes in vision or hearing, sinus congestion, difficulty swallowing Pulmonary: Negative; No cough, wheezing, shortness of breath, hemoptysis Cardiovascular: Negative; No chest pain, presyncope, syncope, palpitations GI: Negative; No nausea, vomiting, diarrhea, or abdominal pain GU: Negative; No dysuria, hematuria, or difficulty voiding Musculoskeletal: Recent bilateral nasal bone fractures and fracture of right zygomatic arch Hematologic/Oncology: Negative; no easy bruising, bleeding Remote history of sarcoidosis, completely resolved Endocrine: Negative; no heat/cold intolerance; no diabetes Neuro: Amnesic to his syncopal spell Skin: Negative; No rashes or skin lesions Psychiatric: Negative; No behavioral problems, depression Sleep: Negative; No snoring, daytime sleepiness, hypersomnolence, bruxism, restless  legs, hypnogognic hallucinations, no cataplexy Other comprehensive 14 point system review is negative.   PE BP 135/71   Pulse 62   Temp (!) 96.4 F (35.8 C)   Ht 5' 9" (1.753 m)   Wt 192 lb 9.6 oz (87.4 kg)   SpO2 96%   BMI 28.44 kg/m      Wt Readings from Last 3 Encounters:  07/08/19 192 lb 9.6 oz (87.4 kg)  05/19/19 195 lb (88.5 kg)  05/02/19 190 lb (86.2 kg)   General: Alert, oriented, no distress.  Skin: normal  turgor, no rashes, warm and dry HEENT: Normocephalic, atraumatic. Pupils equal round and reactive to light; sclera anicteric; extraocular muscles intact;  Nose without nasal septal hypertrophy Mouth/Parynx benign; Mallinpatti scale 2 Neck: No JVD, no carotid bruits; normal carotid upstroke Lungs: clear to ausculatation and percussion; no wheezing or rales Chest wall: without tenderness to palpitation Heart: PMI not displaced, RRR, s1 s2 normal, 2/6 early peaking systolic murmur consistent with mild aortic stenosis, 1/6 diastolic murmur appreciated with bending over at end exhalation, no rubs, gallops, thrills, or heaves Abdomen: soft, nontender; no hepatosplenomehaly, BS+; abdominal aorta nontender and not dilated by palpation. Back: no CVA tenderness Pulses 2+ Musculoskeletal: full range of motion, normal strength, no joint deformities Extremities: no clubbing cyanosis or edema, Homan's sign negative  Neurologic: grossly nonfocal; Cranial nerves grossly wnl Psychologic: Normal mood and affect  ECG (independently read by me): Sinus bradycardia 58 bpm, right bundle branch block with repolarization changes.  Borderline criteria for LVH.  No ectopy.  QTc interval 447 ms  December 2020 ECG (independently read by me): Sinus bradycardia at 54 bpm, right bundle branch block with repolarization changes.  Left axis deviation.  Borderline voltage criteria for LVH  April 08, 2019 ECG (independently read by me): Sinus rhythm at 65 bpm, left axis deviation, right bundle branch block with repolarization changes.  LVH by voltage.  QTc interval 453 ms.  PR interval 146 ms.  September 2020 ECG (independently read by me): Sinus bradycardia at 53 bpm, right bundle branch block with repolarization changes.  QRS duration 156 ms.  Mild LVH by voltage in aVL.  QTc interval 439 ms  March 2019 ECG (independently read by me): normal sinus rhythm at 65 bpm.  One isolated PVC.  Incomplete right bundle branch block.   Borderline LVH.  Normal intervals.  February 2018 ECG (independently read by me): Sinus bradycardia at 56 bpm.  Mild sinus arrhythmia.  Mild LVH.  Normal intervals.  No ST segment changes.  February 2017 ECG (independently read by me):  Normal sinus rhythm at 63 bpm.  Early transition.  No ST segment changes.  December 2015 ECG (independently read by me): Normal sinus rhythm at 66 bpm.  Mild RV conduction delay.  Early transition.  December 2014 ECG: Sinus rhythm with an occasional PVC; normal intervals.  LABS: I personally reviewed the laboratory from [facility] done on 02/19/2016.  BUN 18, creatinine 0.9.  Ingrown 14.7, hematocrit 44.9.  Normal LFTs.  TSH 2.08.  April lipoprotein B 66.  Normal PSA.  Lipid studies as noted above.  Lpid studies from March 03, 2017: Total cholesterol 134, HDL 39, LDL 70, triglycerides 124.    BMP Latest Ref Rng & Units 06/30/2019 04/28/2019 02/24/2019  Glucose 65 - 99 mg/dL 88 106(H) 88  BUN 8 - 27 mg/dL _0 Creatinine 0.76 - 1.27 mg/dL 1.03 0.91 1.02  BUN/Creat Ratio 10 - _1 21  Sodium 134 - 144 mmol/L 138 139 139  Potassium 3.5 - 5.2 mmol/L 4.8 4.9 4.8  Chloride 96 - 106 mmol/L 103 102 102  CO2 20 - 29 mmol/L _0 Calcium 8.6 - 10.2 mg/dL 10.0 10.0 10.3(H)   Hepatic Function Latest Ref Rng & Units 06/30/2019 03/20/2015 08/21/2014  Total Protein 6.0 - 8.5 g/dL 6.6 6.6 7.0  Albumin 3.6 - 4.6 g/dL 4.3 4.0 4.1  AST 0 - 40 IU/L 45(H) 29 32  ALT 0 - 44 IU/L 54(H) 30 32  Alk Phosphatase 39 - 117 IU/L 73 68 73  Total Bilirubin 0.0 - 1.2 mg/dL 0.7 0.8 0.7   CBC Latest Ref Rng & Units 06/30/2019 04/28/2019 01/26/2019  WBC 3.4 - 10.8 x10E3/uL 6.7 9.5 10.8(H)  Hemoglobin 13.0 - 17.7 g/dL 14.1 14.5 14.3  Hematocrit 37.5 - 51.0 % 43.6 44.1 43.9  Platelets 150 - 450 x10E3/uL 146(L) 187 176   Lab Results  Component Value Date   MCV 92 06/30/2019   MCV 91 04/28/2019   MCV 92.8 01/26/2019   Additional studies reviewed: I reviewed  all the records from [facility] including his CT images of January 25, 2019 following his recent syncopal spell induced  Trauma.  ECHO 7//28/2020 IMPRESSIONS  1. The left ventricle has normal systolic function with an ejection fraction of 60-65%. The cavity size was normal. There is moderate asymmetric left ventricular hypertrophy. Left ventricular diastolic Doppler parameters are consistent with  pseudonormalization.  2. The right ventricle has normal systolic function. The cavity was normal. There is no increase in right ventricular wall thickness. Right ventricular systolic pressure could not be assessed.  3. Left atrial size was mildly dilated.  4. The aortic valve is abnormal. Moderate calcification of the aortic valve. Aortic valve regurgitation is trivial by color flow Doppler. Mild stenosis with a mean systolic gradient of 11 mmHg of the aortic valve.  5. The aorta is normal in size and structure when indexed to body surface area (ascending aorta measures 38 mm).  CARDIAC MRI 02/11/2019 IMPRESSION: 1. Normal left ventricular size with mild LV hypertrophy. This appears concentric and not asymmetric. EF 60%, normal wall motion.  2. The RV is mildly dilated with mild to moderate hypokinesis, EF 35%.  3. There is no definite myocardial LGE, so no definitive evidence for prior MI, infiltrative disease, or myocarditis.  Abnormal right ventricle. However, there is no definitive evidence for cardiac sarcoidosis or hypertrophic cardiomyoapthy.  ----------------------------------------------------------------------------------------------------------- CTA CORONARY :  IMPRESSION: 03/07/2019  1. Coronary calcium score of 0. This was 0 percentile for age and sex matched control.  2. Normal coronary origin with right dominance.  3. No evidence of CAD.  Fransico Him  ADDENDUM/ Edited Result :  CTA  Aorta: Normal size. Calcifications of the aortic root and  ascending aorta. No dissection.  Aortic Valve:  Trileaflet.  Moderate calcifications.  Coronary Arteries:  Normal coronary origin.  Left dominance.  RCA is a small non dominant artery that gives rise to PDA and PLVB. There is mild calcified plaque in the proximal RCA with associated stenosis of 25-49%. There is motion artifact in the mid and distal RCA.  Left main is a large artery that gives rise to LAD and LCX arteries. There is moderate calcified plaque in the mid to distal LM with associated 50-69% stenosis.  LAD is a large vessel that gives rise to a large D1 and moderate sized D2. There is moderate calcified plaque in the  proximal LAD with associated 50-69% stenosis. There is severe calcified plaque in the mid LAD with associated stenosis of 70-99%.  LCX is a large dominant artery that gives rise to one large OM1 branch. There is minimal calcified plaque in the proximal to mid LCx with associated stenosis of 0-24%. There is mild calcified plaque in the mid LCx at the takeoff of a moderate sized branching OM with associated stenosis of 25-49%. There is minimal atherosclerosis of the distal LCx with associated stenosis of 0 - 24%.  Other findings:  Normal pulmonary vein drainage into the left atrium.  Normal let atrial appendage without a thrombus.  Normal size of the pulmonary artery.  IMPRESSION: 1. Coronary calcium score of 1151. This was 66th percentile for age and sex matched control.  2.  Normal coronary origin with left dominance.  3.  Moderate to severe calcified AV cusps.  4.  Atherosclerosis of the aortic root and ascending aorta.  5. Moderate Calcified plaque in the distal LM and severe calcified plaque in the mid LAD. CAD-RADS 4b. Significant blooming may over estimate LAD stenosis.  6.  Recommend aggressive risk factor modification.  7.  Recommend cardiac catheterization.  8.  Study has been sent for [facility] analysis.  Fransico Him  ----------------------------------------------------------------------------------------- FFR FINDINGS: FFRct analysis was performed on the original cardiac CT angiogram dataset. Diagrammatic representation of the FFRct analysis is provided in a separate PDF document in PACS. This dictation was created using the PDF document and an interactive 3D model of the results. 3D model is not available in the EMR/PACS. Normal FFR range is >0.80.  1. No significant flow limiting lesion: LM FFR = 0.99.  2. LAD: No significant flow limiting lesion: Proximal FFR = 0.98, Mid FFR = 0.96, Distal FFR = 0.89.  3. LCX: No significant flow limiting lesion: Proximal FFR = 0.97, Mid FFR = 0.94, Distal FFR = 0.91.  4. RCA: No flow limiting lesion in non dominant small RCA: Proximal FFR = 1.00, Mid FFR = 0.97, distal FFR not obtained.  IMPRESSION: 1. CT FFR analysis show no hemodynamically significant flow limiting lesions.  ------------------------------------------------------------------------------------------- CARDIAC CATH 05/02/2019  Mid LM to Dist LM lesion is 30% stenosed.  Prox LAD lesion is 30% stenosed.  Mid LAD lesion is 20% stenosed.  Dist LAD lesion is 40% stenosed.   There is evidence for mild to moderate coronary calcification without high-grade obstructive disease.  There is smooth distal tapering of the left main with 30% narrowing; 30% proximal and 20% mid ossified stenoses in the LAD with 40% diffuse stenosis in a small caliber mid LAD vessel.  The left circumflex vessel is an angiographically normal dominant vessel; the RCA is normal nondominant vessel.  There is a small caliber ramus intermediate branch which appears to fill directly into the left ventricle resulting in ventricular opacification,  Mild transvalvular aortic gradient at 10 to 73mHg going from the left ventricle to the aorta consistent with his known very mild aortic valve stenosis.  There is mild  aortic valve calcification.  RECOMMENDATION:  The patient will continue on aspirin therapy as well as his low-dose beta-blocker therapy.  A loop recorder will be placed in the short stay following his catheterization with careful close monitoring of his rhythm status.  Continue aggressive lipid-lowering therapy with target LDL ideally in the 50s or below.  The patient was recently switched from simvastatin to rosuvastatin 40 mg.      LOOP RECORDER DEVICE CHECK: 06/08/2019 Carelink summary report  received. Battery status OK. Normal device function. No new symptom episodes, tachy episodes, brady, or pause episodes. No new AF episodes. Monthly summary reports and ROV/PRN  ECHO 07/05/2019 IMPRESSIONS  1. Left ventricular ejection fraction, by estimation, is 65 to 70%. The  left ventricle has hyperdynamic function. The left ventrical has no  regional wall motion abnormalities. Left ventricular diastolic parameters  are consistent with Grade I diastolic  dysfunction (impaired relaxation).  2. Right ventricular systolic function is normal. The right ventricular  size is mildly enlarged. There is moderately elevated pulmonary artery  systolic pressure. The estimated right ventricular systolic pressure is  16.5 mmHg.  3. No evidence of mitral valve regurgitation.  4. Tricuspid valve regurgitation is moderate.  5. Decreased motion of left and non coronary cusp. The aortic valve is  tricuspid. Aortic valve regurgitation is moderate. Mild aortic valve  stenosis. Aortic valve area, by VTI measures 1.78 cm. Aortic valve mean  gradient measures 10.5 mmHg. Aortic  valve Vmax measures 2.17 m/s.  6. Aortic dilatation noted. 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  7. The inferior vena cava is normal in size with greater than 50%  respiratory variability, suggesting right atrial pressure of 3 mmHg.   Comparison(s): No significant change from prior study. Prior images   reviewed side by side.   IMPRESSION:  1. Paroxysmal SVT (supraventricular tachycardia) ([place])   2. Mild aortic stenosis with moderate aortic insufficiency   3. High coronary artery calcium score   4. CAD in native artery   5. RBBB (right bundle branch block with left anterior fascicular block)   6. Pure hypercholesterolemia     ASSESSMENT AND PLAN: Eugene [name] is an 86 year old gentleman who has a remote history of prior sarcoid disease with ultimate complete remisson.  He has a history of mild hyperlipidemia which has been aggressively controlled.  Remotely, he was found to have mild coronary calcification on CT imaging.  I have been aggressive with lipid therapy in attempt to potentially induce plaque regression and previously he has been on Zetia/simvastatin 10/40 with LDL cholesterols previously documented to be 70.  In 2016 he was found to have aortic stenosis which was mild with mild aortic insufficiency with a mean gradient of 9 and peak gradient of 18.  Recently, while [place] bike at the beach, he apparently had a syncopal spell.  He has no recollection of falling and when he awakened he was in [facility] emergency room.  It does not appear that he had any significant awareness of rhythm disturbance and apparently when EMS arrived he must have had 6 stable cardiac rhythm.  In July 2020  a follow-up echo Doppler study which still confirmed just mild aortic stenosis with a valve area of 1.7 cm.  It is unlikely that the severity of his aortic stenosis accounts for his syncope.  Recently, his ECG showed definite right bundle branch block which in the past had just been incomplete right bundle.  On his ECG of February 14, 2019 he was in sinus rhythm, although bradycardic at 53 with right bundle branch block.  QTc interval was normal.  I was concerned potential ischemic etiology scheduled him to undergo a coronary CTA for further evaluation of potential significant coronary obstructive disease. I   also scheduling him for carotid duplex imaging to assess his carotid arteries and vertebral flow.  His Zio patch findings demonstrated predominant sinus rhythm but revealed several episodes of supraventricular tachycardia, as well  as episodes of isolated ectopic in isolated ventricular ectopy.  His fastest heartbeat was 190 bpm which lasted 4 beats and was consistent with SVT.  The longest SVT episode was 12.8 seconds with an average rate of 138 bpm. 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  I again reviewed his cardiac catheterization things with both he and his wife today.  He does not have any anginal symptomatology.  He is now on a more aggressive lipid management strategy in an attempt to induce significant plaque regression and most recent LDL on rosuvastatin 40 mg and Zetia 10 mg is 30.  His most recent echo Doppler study does not show any significant change from last July 2020.  LV function remains hyperdynamic with EF 65 to 70%.  There is no LVH.  He continued to have mild aortic stenosis with a mean gradient of 10.5 and peak gradient 18.8. There is severe thickening and calcification of the aortic valve.  Aortic regurgitation is moderate.  He has evidence for mild dilation of the ascending order to 40 mm and mild pulmonary hypertension with an estimated pressure at 40 mm Hg.  He continues to undergo monthly device checks of his loop recorder.  He has normal device function with no significant ectopy or pauses noted.  He tolerated the addition of low-dose metoprolol succinate at 12.5 mg with ECG showing sinus bradycardia at 58 bpm.  He will continue with his current medical regimen.  I will see him in 6 months for reevaluation or sooner if problems arise.   Troy Sine, MD, [facility]  07/09/2019 4:44 PM

## 2019-07-08 NOTE — Patient Instructions (Signed)
Medication Instructions:  CONTINUE WITH CURRENT MEDICATIONS. NO CHANGES.  *If you need a refill on your cardiac medications before your next appointment, please call your pharmacy*  Follow-Up: At [facility], you and your health needs are our priority.  As part of our continuing mission to provide you with exceptional heart care, we have created designated Provider Care Teams.  These Care Teams include your primary Cardiologist (physician) and Advanced Practice Providers (APPs -  Physician Assistants and Nurse Practitioners) who all work together to provide you with the care you need, when you need it.  Your next appointment:   6 month(s)  The format for your next appointment:   In Person  Provider:   Thomas Kelly, MD    

## 2019-07-09 ENCOUNTER — Encounter: Payer: Self-pay | Admitting: Cardiovascular Disease

## 2019-08-08 ENCOUNTER — Ambulatory Visit (INDEPENDENT_AMBULATORY_CARE_PROVIDER_SITE_OTHER): Payer: Medicare Other | Admitting: *Deleted

## 2019-08-08 DIAGNOSIS — R55 Syncope and collapse: Secondary | ICD-10-CM | POA: Diagnosis not present

## 2019-08-08 LAB — CUP PACEART REMOTE DEVICE CHECK
Date Time Interrogation Session: 20210314230512
Implantable Pulse Generator Implant Date: 20201207

## 2019-08-08 NOTE — Progress Notes (Signed)
ILR Remote 

## 2019-09-08 ENCOUNTER — Ambulatory Visit (INDEPENDENT_AMBULATORY_CARE_PROVIDER_SITE_OTHER): Payer: Medicare Other | Admitting: *Deleted

## 2019-09-08 DIAGNOSIS — R55 Syncope and collapse: Secondary | ICD-10-CM

## 2019-09-08 LAB — CUP PACEART REMOTE DEVICE CHECK
Date Time Interrogation Session: 20210414230300
Implantable Pulse Generator Implant Date: 20201207

## 2019-09-08 NOTE — Progress Notes (Signed)
ILR Remote 

## 2019-10-06 DIAGNOSIS — E7800 Pure hypercholesterolemia, unspecified: Secondary | ICD-10-CM | POA: Diagnosis not present

## 2019-10-10 ENCOUNTER — Ambulatory Visit (INDEPENDENT_AMBULATORY_CARE_PROVIDER_SITE_OTHER): Payer: Medicare Other | Admitting: *Deleted

## 2019-10-10 DIAGNOSIS — R55 Syncope and collapse: Secondary | ICD-10-CM

## 2019-10-10 LAB — CUP PACEART REMOTE DEVICE CHECK
Date Time Interrogation Session: 20210515230510
Implantable Pulse Generator Implant Date: 20201207

## 2019-10-11 DIAGNOSIS — R3912 Poor urinary stream: Secondary | ICD-10-CM | POA: Diagnosis not present

## 2019-10-11 DIAGNOSIS — N210 Calculus in bladder: Secondary | ICD-10-CM | POA: Diagnosis not present

## 2019-10-11 DIAGNOSIS — R310 Gross hematuria: Secondary | ICD-10-CM | POA: Diagnosis not present

## 2019-10-11 NOTE — Progress Notes (Signed)
Carelink Summary Report / Loop Recorder 

## 2019-11-14 ENCOUNTER — Ambulatory Visit (INDEPENDENT_AMBULATORY_CARE_PROVIDER_SITE_OTHER): Payer: Medicare Other | Admitting: *Deleted

## 2019-11-14 DIAGNOSIS — R55 Syncope and collapse: Secondary | ICD-10-CM | POA: Diagnosis not present

## 2019-11-14 LAB — CUP PACEART REMOTE DEVICE CHECK
Date Time Interrogation Session: 20210620230450
Implantable Pulse Generator Implant Date: 20201207

## 2019-11-15 NOTE — Progress Notes (Signed)
Carelink Summary Report / Loop Recorder 

## 2019-12-19 ENCOUNTER — Ambulatory Visit (INDEPENDENT_AMBULATORY_CARE_PROVIDER_SITE_OTHER): Payer: Medicare Other | Admitting: *Deleted

## 2019-12-19 DIAGNOSIS — R55 Syncope and collapse: Secondary | ICD-10-CM

## 2019-12-20 LAB — CUP PACEART REMOTE DEVICE CHECK
Date Time Interrogation Session: 20210725230529
Implantable Pulse Generator Implant Date: 20201207

## 2019-12-22 NOTE — Progress Notes (Signed)
Carelink Summary Report / Loop Recorder 

## 2019-12-29 DIAGNOSIS — E7800 Pure hypercholesterolemia, unspecified: Secondary | ICD-10-CM | POA: Diagnosis not present

## 2019-12-29 DIAGNOSIS — R7401 Elevation of levels of liver transaminase levels: Secondary | ICD-10-CM | POA: Diagnosis not present

## 2020-01-23 ENCOUNTER — Ambulatory Visit (INDEPENDENT_AMBULATORY_CARE_PROVIDER_SITE_OTHER): Payer: Medicare Other | Admitting: *Deleted

## 2020-01-23 DIAGNOSIS — R55 Syncope and collapse: Secondary | ICD-10-CM

## 2020-01-23 LAB — CUP PACEART REMOTE DEVICE CHECK
Date Time Interrogation Session: 20210827230558
Implantable Pulse Generator Implant Date: 20201207

## 2020-01-25 NOTE — Progress Notes (Signed)
Carelink Summary Report / Loop Recorder 

## 2020-01-27 DIAGNOSIS — Z23 Encounter for immunization: Secondary | ICD-10-CM | POA: Diagnosis not present

## 2020-02-08 ENCOUNTER — Telehealth: Payer: Self-pay | Admitting: Cardiovascular Disease

## 2020-02-08 NOTE — Telephone Encounter (Signed)
Left message for patient to call and reschedule 02/10/20 appt with Dr. Claiborne Billings.

## 2020-02-10 ENCOUNTER — Ambulatory Visit (INDEPENDENT_AMBULATORY_CARE_PROVIDER_SITE_OTHER): Payer: Medicare Other | Admitting: Cardiovascular Disease

## 2020-02-10 ENCOUNTER — Other Ambulatory Visit: Payer: Self-pay

## 2020-02-10 DIAGNOSIS — I2510 Atherosclerotic heart disease of native coronary artery without angina pectoris: Secondary | ICD-10-CM | POA: Diagnosis not present

## 2020-02-10 DIAGNOSIS — I471 Supraventricular tachycardia: Secondary | ICD-10-CM

## 2020-02-10 DIAGNOSIS — E785 Hyperlipidemia, unspecified: Secondary | ICD-10-CM

## 2020-02-10 DIAGNOSIS — R931 Abnormal findings on diagnostic imaging of heart and coronary circulation: Secondary | ICD-10-CM

## 2020-02-10 DIAGNOSIS — I452 Bifascicular block: Secondary | ICD-10-CM

## 2020-02-10 DIAGNOSIS — I350 Nonrheumatic aortic (valve) stenosis: Secondary | ICD-10-CM

## 2020-02-10 NOTE — Patient Instructions (Signed)
Medication Instructions:  CONTINUE WITH CURRENT MEDICATIONS. NO CHANGES.  *If you need a refill on your cardiac medications before your next appointment, please call your pharmacy*   Testing/Procedures: 6 mo Your physician has requested that you have an echocardiogram. Echocardiography is a painless test that uses sound waves to create images of your heart. It provides your doctor with information about the size and shape of your heart and how well your heart's chambers and valves are working. This procedure takes approximately one hour. There are no restrictions for this procedure.     Follow-Up: At [facility], you and your health needs are our priority.  As part of our continuing mission to provide you with exceptional heart care, we have created designated Provider Care Teams.  These Care Teams include your primary Cardiologist (physician) and Advanced Practice Providers (APPs -  Physician Assistants and Nurse Practitioners) who all work together to provide you with the care you need, when you need it.  We recommend signing up for the patient portal called "MyChart".  Sign up information is provided on this After Visit Summary.  MyChart is used to connect with patients for Virtual Visits (Telemedicine).  Patients are able to view lab/test results, encounter notes, upcoming appointments, etc.  Non-urgent messages can be sent to your provider as well.   To learn more about what you can do with MyChart, go to [email or web address].    Your next appointment:   6 month(s)  The format for your next appointment:   In Person  Provider:   Shelva Majestic, MD

## 2020-02-10 NOTE — Progress Notes (Signed)
Patient ID: Eugene [name], male   DOB: 06-04-1932, 86 y.o.   MRN: [id number]    Primary MD: Eugene [name]  HPI: Eugene [name] is a 86 y.o. male who presents to the office today for a 7 month cardiology evaluation.  Eugene [name] has a remote history of sarcoidosis and had been followed at [facility] with complete remission. He has a history of mild hyperlipidemia and has been on Vytorin 10/40. He also has a history of BPH on Proscar.  An echo Doppler study which was done to evaluate a systolic murmur in the aortic region noted in December 2013 showed normal systolic function with mild focal basal hypertrophy of the septum. He had normal systolic function. There was evidence for a moderate diffuse calcification involving the noncoronary cusp of a trileaflet aortic valve. He did have sclerosis without stenosis. Valve area was 2.55 cm. RV size was upper normal to mildly dilated. A nuclear perfusion study done in 2013 was unchanged from 6 years previously and continued to show normal perfusion.  Laboratory done by Eugene [name] in August 2015 was normal with a hemoglobin of 14.5, hematocrit 43.8.  Renal function was excellent with a BUN of 19, Cr 0.99.  Fasting glucose was 78.  Lipid studies remained excellent with a total cholesterol 125, triglycerides 14, HDL cholesterol 44, and LDL cholesterol at 60.  Angiotensin converting enzyme was normal at 37 and last year was 33.  He underwent an echo Doppler study in 03/13/2015.  This revealed an ejection fraction at 60-65%. There was grade 1 diastolic dysfunction.  He had normal LV filling pressures.  His aortic valve was calcified and there was evidence for mild aortic stenosis with a mean gradient of 9, peak gradient of 18, and valve area of 1.87 cm and mild aortic insufficiency.  There was mild mitral annular calcification with trivial Eugene , mild LA dilatation, and moderate TR with PA pressure 30 mm. He underwent right knee replacement  surgery by Eugene [name] in November.  Postoperatively had some swelling and lower extremity venous studies were negative.  When I saw him in 2017, he was doing well from a cardiac standpoint.   Specifically, he denied any episodes of chest pain.  He was able to play tennis again following his knee surgery.  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  He had also undergone follow-up CT imaging asked and was found to have a new irregularly-shaped nodular density in the left lower lobe, alt to represent a possible focus of active infection/inflammation.  A follow-up CT was recommended.  After a course of antimicrobial therapy.  His CT also demonstrated a stable ectatic.  Borderline aneurysmal ascending thoracic aorta at 3.9 cm.  He had evidence for coronary calcification.   Laboratory in 2017 by his primary physician showed total cholesterol was 134, triglycerides 79, HDL 43, and LDL 75 on his current dose of ezetimibe 10 mg and simvastatin 40 mg.   When I saw him in March 2019 he was remaining active and was walking on elliptical machine at least 3-4 times per week and was playing tennis 2 times per week.  He denied any chest pain or shortness of breath.  He denies palpitations.   In 2019, he was found to have a pulmonary infiltrate for which she was evaluated by  Eugene [name].  He had undergone bronchoscopy with biopsy and ultimately underwent an evaluation at [facility] and this nodule had not significantly changed over the past 16 years.  I evaluated him in May 2020 and a telemedicine visit.  At that time he denied any chest pain, palpitations, presyncope or syncope.  He denied any PND orthopnea.  He had previously been followed by Dr. Ardeth [name] but he had recently signed up for concierge medicine with Dr. Derinda [name].  During that evaluation I recommended he  undergo a follow-up echo Doppler study to reassess his aortic valve since in 2016 he was found to have mild aortic stenosis.  He underwent an echo Doppler study on December 21, 2018 which continues to show normal systolic function with EF at 60 to 65%, and moderate asymmetric left ventricular hypertrophy.  There was grade 2 diastolic dysfunction.  There was mild aortic stenosis with moderate calcification of the aortic valve.  He had a mean systolic gradient of 11 mm and his peak gradient was 20.8 mm.  Calculated aortic valve was 1.7 cm and was consistent with mild aortic stenosis.  On January 25, 2019 while biking at [place] he apparently had a syncopal spell.  He was found by bystanders.  Apparently he is amnesic to the event and when he woke up he was at [facility] in the emergency room.  It did not appear that he was aware when he had fallen since there was no apparent marks on his hands to break a fall.  A CT scan of the maxillofacial area demonstrated a comminuted displaced bilateral nasal bone fracture as well as fracture of the right zygomatic arch.  There was a frontal scalp hematoma.  He had extensive CT imaging of his chest abdomen and pelvis.  Of note, on his chest CT he was felt to have severe coronary artery atherosclerotic calcifications.  There was no evidence for acute traumatic injury within the chest abdomen or pelvis.  He had a 3.6 cm indeterminate liver mass, 4.3 cm indeterminate left renal lesion and had multiple bladder stones and bladder diverticula likely related to chronic outlet obstruction from an enlarged prostate and had nonobstructive bilateral nephrolithiasis.  Upon his return to [facility], he was evaluated by Eugene [name] in the [place] primary care.  He subsequently was evaluated by Eugene [name] in our office for cardiology evaluation during my absence.  With his memory absence, it is felt that he had a concussion.  When seen by Eugene [name],  she reviewed his extensive evaluation and recent echo which confirmed that he did not have significant aortic stenosis.  Because of his remote history of sarcoidosis, she scheduled him to undergo cardiac MRI imaging to assess for scar/infiltrative disease.  He was also scheduled to undergo 14-day Ziotelemetry monitor.    I saw him in follow-up of Eugene [name] on February 14, 2019.  At the time of his evaluation he had not yet received his Zio patch monitor.  I reviewed extensively his records from [facility] as well as the records of Eugene [name].  His Eugene scan was reviewed and did not show any definitive myocardial [name] gadolinium enhancement so there was no definitive evidence for prior myocardial infarction, infiltrative disease or myocarditis.  LVEF was approximately 60%.  His RV EF was reported at 35% however when reviewed by Eugene [name] she had felt in 20 function was somewhat better.  I scheduled him to undergo coronary CTA to assess potential CAD  disease with silent ischemia leading to his clinical event.  He also underwent carotid duplex imaging.Marland Kitchen  His 14-day ZIO monitor showed predominant sinus rhythm with right bundle branch block.  He did have an average heart rate at 62 bpm with a minimum of 42 while sleeping and maximal heart rate of 190.  He had several short bursts of SVT the fastest interval lasting 4 beats with a maximum rate of 190.  The longest SVT episode was 12.8 seconds with an average heart rate of 138 bpm.  There was no VT or atrial fibrillation or high degree block or pauses noted.  Carotid duplex imaging essentially normal with normal antegrade vertebral and subclavian flow and was without evidence for internal carotid stenoses.  I received an initial report of his CT angiogram which came back as normal.  We had notified him of the results.  Apparently, there was an addendum to the report port when it was reinterpreted adjusted multivessel CAD with a calcium score  of 1151 with moderate calcific plaque in the mid distal left main, severe calcific plaque in the mid LAD mild calcific plaque in the circumflex and RCA.  There was concern of possible significant blooming artifact which may have contributed to overestimate of LAD stenoses.  Subsequent FFR analysis was normal and did not reveal any hemodynamic significant stenoses.   I had a long conversation with Eugene. Braddy as well as called his daughter in [place] in follow-up of his April 08, 2019 office visit.  At that time I recommended initiation of aspirin 81 mg and initiated metoprolol succinate 12.5 mg daily.  I also discussed changing him to more aggressive lipid-lowering therapy and he recently was started on rosuvastatin 40 mg in place of simvastatin. When I last saw him in the office on April 18, 2019 I recommended definitive cardiac catheterization in light of his coronary CT angio findings also recommended insertion of a loop monitor for potential long-term monitoring of potential cardiac arrhythmia.   Eugene. Lappe underwent cardiac catheterization on May 02, 2019 by me. There was mild to moderate coronary calcification without high-grade obstructive disease. There was a smooth distal tapering of his left main with 30% narrowing, 30% proximal and 20% mid calcified stenoses in the LAD with 40% diffuse stenosis in a small caliber mid LAD vessel. The left circumflex vessel was angiographically normal and dominant. The RCA was a normal nondominant vessel. Of note, there was a small caliber ramus intermediate branch which appeared to fill directly into the left ventricle resulting in left ventricular opacification. He had only very mild aortic stenosis with a transvalvular aortic gradient at 10 to 12 mm. There was mild aortic valve calcification. There that day before going home he underwent insertion of a loop recorder by Dr. Sallyanne Kuster.  I saw him in follow-up of his catheterization and loop recorder on May 19, 2019.  Over the last several weeks, Eugene. Vandevoort has felt well. He denies any episodes of chest pain or shortness of breath. Loop recorder interrogation to date has not shown any events.   Since I saw him in December 2020, he has been undergoing monthly device checks of his loop recorder.  He has normal device function.  There are no new symptom episodes or episodes of tachycardia, bradycardia or pauses.  There were no new AF episodes.  He underwent a follow-up echo Doppler study on July 05, 2019.  This essentially is unchanged from his prior evaluation and continues to show hyperdynamic LV function.  He has grade 1 diastolic dysfunction.  There is decreased motion of his left and noncoronary cusp of a trileaflet aortic valve.  Aortic stenosis is mild with a mean gradient of 10.5 and peak gradient of 18.8.  There is moderate aortic insufficiency.  Ascending aorta is mildly dilated at 40 mm.  There is moderate tricuspid regurgitation.  I last saw him in February 2021 at which time he continued to feel well.  He specifically denied any chest pain, PND orthopnea, presyncope or syncope or awareness of palpitations.    He underwent recent laboratory now on his regimen of Zetia plus rosuvastatin 40 mg, and LDL cholesterol is now markedly reduced from 72 down to 30 with combination treatment.  There is minimally elevated AST and ALT levels of 45 and 5 respectively.  He remains asymptomatic.  Since his last evaluation, he has seen Dr. Derinda [name] who is his primary provider.  He underwent recent laboratory which I was able to obtain today after his office visit and called him to discuss the findings with him.  Laboratory from January 03, 2020 was excellent.  LDL cholesterol was 36.  He underwent particle assessment and LDL particle number was optimal at 905 as was small LDL particles at 138.  April lipoprotein B was excellent at 49 and LP(a) was excellent at 27.  He continues to be active and plays tennis and  does elliptical.  He also rides his bike.  He denies any anginal symptoms.  He continues to have monthly loop recorder checks.  His most recent evaluation did not reveal any events.  He presents for evaluation   Past Medical History:  Diagnosis Date  . Bladder stones   . BPH (benign prostatic hyperplasia)   . Hernia of abdominal cavity   . History of kidney stones   . Hyperlipidemia   . Incomplete right bundle branch block   . Low O2 saturation 04/04/2015   Wife says O2 sats are in the low 90s base line.  Now in the 25s.  Will do incentive spirometry  . Primary localized osteoarthritis of right knee 04/02/2015  . Sarcoidosis    diagnosed 8-9 yrs ago  . Upper GI bleed 04/04/2015   Patient had coffee ground emesis that was Guiac positive on Monday.  EGD scheduled for 04/04/2015. Started on IV Protonix    Past Surgical History:  Procedure Laterality Date  . ANTERIOR CERVICAL DECOMP/DISCECTOMY FUSION  2007  . BRONCHOSCOPY  2007  . CARDIOVASCULAR STRESS TEST  04/29/2012   normal nuclear stress study.   . CYSTOSCOPY  2004  . DOPPLER ECHOCARDIOGRAPHY  04/29/2012   EF not noted. small perimembranous ventricular septal defect. small left to right ventricular shunt. moderate diffuse calcification involving noncoronary cusp of the aortic valve.   . ESOPHAGOGASTRODUODENOSCOPY  04/04/2015  . ESOPHAGOGASTRODUODENOSCOPY N/A 04/04/2015   Procedure: ESOPHAGOGASTRODUODENOSCOPY (EGD);  Surgeon: Manus Gunning, MD;  Location: [facility];  Service: Gastroenterology;  Laterality: N/A;  . INGUINAL HERNIA REPAIR Right 2009  . LEFT HEART CATH AND CORONARY ANGIOGRAPHY N/A 05/02/2019   Procedure: LEFT HEART CATH AND CORONARY ANGIOGRAPHY;  Surgeon: Troy Sine, MD;  Location: [facility];  Service: Cardiovascular;  Laterality: N/A;  . LOOP RECORDER INSERTION N/A 05/02/2019   Procedure: LOOP RECORDER INSERTION;  Surgeon: Sanda Klein, MD;  Location: [facility];  Service: Cardiovascular;   Laterality: N/A;  . TOTAL KNEE ARTHROPLASTY Right 04/02/2015   Procedure: TOTAL KNEE ARTHROPLASTY;  Surgeon: Elsie Saas, MD;  Location: [facility];  Service: Orthopedics;  Laterality: Right;  Marland Kitchen VIDEO BRONCHOSCOPY Bilateral 09/23/2017   Procedure: VIDEO BRONCHOSCOPY WITH FLUORO;  Surgeon: Rigoberto Noel, MD;  Location: [facility];  Service: Cardiopulmonary;  Laterality: Bilateral;    No Known Allergies  Current Outpatient Medications  Medication Sig Dispense Refill  . aspirin EC 81 MG tablet Take 1 tablet (81 mg total) by mouth daily. 90 tablet 3  . Cholecalciferol (VITAMIN D) 50 MCG (2000 UT) CAPS Take 2,000 Units by mouth daily.     Marland Kitchen ezetimibe (ZETIA) 10 MG tablet TAKE (1) TABLET DAILY AT BEDTIME. (Patient taking differently: Take 10 mg by mouth at bedtime. ) 90 tablet 3  . finasteride (PROSCAR) 5 MG tablet Take 5 mg by mouth every evening.     . metoprolol succinate (TOPROL XL) 25 MG 24 hr tablet Take 0.5 tablets (12.5 mg total) by mouth daily. 45 tablet 6  . potassium citrate (UROCIT-K) 10 MEQ (1080 MG) SR tablet Take 10 mEq by mouth daily.    . rosuvastatin (CRESTOR) 40 MG tablet Take 40 mg by mouth daily.     No current facility-administered medications for this visit.    Social History   Socioeconomic History  . Marital status: Married    Spouse name: Not on file  . Number of children: Not on file  . Years of education: Not on file  . Highest education level: Not on file  Occupational History  . Not on file  Tobacco Use  . Smoking status: Former Smoker    Types: Pipe    Quit date: 05/26/1972    Years since quitting: 47.7  . Smokeless tobacco: Never Used  . Tobacco comment: Quit 43 year  Substance and Sexual Activity  . Alcohol use: Yes    Alcohol/week: 4.0 standard drinks    Types: 4 Standard drinks or equivalent per week  . Drug use: No  . Sexual activity: Not on file  Other Topics Concern  . Not on file  Social History Narrative  . Not on file   Social  Determinants of Health   Financial Resource Strain:   . Difficulty of Paying Living Expenses: Not on file  Food Insecurity:   . Worried About Charity fundraiser in the Last Year: Not on file  . Ran Out of Food in the Last Year: Not on file  Transportation Needs:   . Lack of Transportation (Medical): Not on file  . Lack of Transportation (Non-Medical): Not on file  Physical Activity:   . Days of Exercise per Week: Not on file  . Minutes of Exercise per Session: Not on file  Stress:   . Feeling of Stress : Not on file  Social Connections:   . Frequency of Communication with Friends and Family: Not on file  . Frequency of Social Gatherings with Friends and Family: Not on file  . Attends Religious Services: Not on file  . Active Member of Clubs or Organizations: Not on file  . Attends Archivist Meetings: Not on file  . Marital Status: Not on file  Intimate Partner Violence:   . Fear of Current or Ex-Partner: Not on file  . Emotionally Abused: Not on file  . Physically Abused: Not on file  . Sexually Abused: Not on file   Socially he is married. He  9 grandchildren. One son lives in [place], a daughter lives in [place]. There is no tobacco use. He does take occasional alcohol. He does exercise.  Family History  Problem Relation Age of Onset  . Ovarian cancer Mother   . Heart attack Father 100    ROS General: Negative; No fevers, chills, or night sweats;  HEENT: Negative; No changes in vision or hearing, sinus congestion, difficulty swallowing Pulmonary: Negative; No cough, wheezing, shortness of breath, hemoptysis Cardiovascular: See HPI GI: Negative; No nausea, vomiting, diarrhea, or abdominal pain GU: Negative; No dysuria, hematuria, or difficulty voiding Musculoskeletal: Recent bilateral nasal bone fractures and fracture of right zygomatic arch Hematologic/Oncology: Negative; no easy bruising, bleeding Remote history of sarcoidosis, completely  resolved Endocrine: Negative; no heat/cold intolerance; no diabetes Neuro: Amnesic to his syncopal spell Skin: Negative; No rashes or skin lesions Psychiatric: Negative; No behavioral problems, depression Sleep: Negative; No snoring, daytime sleepiness, hypersomnolence, bruxism, restless legs, hypnogognic hallucinations, no cataplexy Other comprehensive 14 point system review is negative.   PE BP 117/60   Pulse (!) 56   Ht $R'5\' 9"'pj$  (1.753 m)   Wt 191 lb 12.8 oz (87 kg)   SpO2 96%   BMI 28.32 kg/m    Repeat blood pressure by me was 122/70 supine 120/68 standing  Wt Readings from Last 3 Encounters:  02/10/20 191 lb 12.8 oz (87 kg)  07/08/19 192 lb 9.6 oz (87.4 kg)  05/19/19 195 lb (88.5 kg)   General: Alert, oriented, no distress.  Skin: normal turgor, no rashes, warm and dry HEENT: Normocephalic, atraumatic. Pupils equal round and reactive to light; sclera anicteric; extraocular muscles intact;  Nose without nasal septal hypertrophy Mouth/Parynx benign; Mallinpatti scale 2 Neck: No JVD, no carotid bruits; normal carotid upstroke Lungs: clear to ausculatation and percussion; no wheezing or rales Chest wall: without tenderness to palpitation Heart: PMI not displaced, RRR, s1 s2 normal, 2/6 early peaking systolic murmur consistent with his mild aortic valve stenosis, no diastolic murmur, no rubs, gallops, thrills, or heaves Abdomen: soft, nontender; no hepatosplenomehaly, BS+; abdominal aorta nontender and not dilated by palpation. Back: no CVA tenderness Pulses 2+ Musculoskeletal: full range of motion, normal strength, no joint deformities Extremities: no clubbing cyanosis or edema, Homan's sign negative  Neurologic: grossly nonfocal; Cranial nerves grossly wnl Psychologic: Normal mood and affect   ECG (independently read by me): Sinus bradycardia at 56, RBBB with repolarization, LVH by voltage; normal intervals, no ectopy  February 12, 20212 ECG (independently read by me):  Sinus bradycardia 58 bpm, right bundle branch block with repolarization changes.  Borderline criteria for LVH.  No ectopy.  QTc interval 447 ms  December 2020 ECG (independently read by me): Sinus bradycardia at 54 bpm, right bundle branch block with repolarization changes.  Left axis deviation.  Borderline voltage criteria for LVH  April 08, 2019 ECG (independently read by me): Sinus rhythm at 65 bpm, left axis deviation, right bundle branch block with repolarization changes.  LVH by voltage.  QTc interval 453 ms.  PR interval 146 ms.  September 2020 ECG (independently read by me): Sinus bradycardia at 53 bpm, right bundle branch block with repolarization changes.  QRS duration 156 ms.  Mild LVH by voltage in aVL.  QTc interval 439 ms  March 2019 ECG (independently read by me): normal sinus rhythm at 65 bpm.  One isolated PVC.  Incomplete right bundle branch block.  Borderline LVH.  Normal intervals.  February 2018 ECG (independently read by me): Sinus bradycardia at 56 bpm.  Mild sinus arrhythmia.  Mild LVH.  Normal intervals.  No ST segment changes.  February 2017 ECG (independently read by me):  Normal  sinus rhythm at 63 bpm.  Early transition.  No ST segment changes.  December 2015 ECG (independently read by me): Normal sinus rhythm at 66 bpm.  Mild RV conduction delay.  Early transition.  December 2014 ECG: Sinus rhythm with an occasional PVC; normal intervals.  LABS: I personally reviewed the laboratory from [place] done on 02/19/2016.  BUN 18, creatinine 0.9.  Ingrown 14.7, hematocrit 44.9.  Normal LFTs.  TSH 2.08.  April lipoprotein B 66.  Normal PSA.  Lipid studies as noted above.  Lpid studies from March 03, 2017: Total cholesterol 134, HDL 39, LDL 70, triglycerides 124.    BMP Latest Ref Rng & Units 06/30/2019 04/28/2019 02/24/2019  Glucose 65 - 99 mg/dL 88 106(H) 88  BUN 8 - 27 mg/dL $Remove'16 21 21  'sOpucVA$ Creatinine 0.76 - 1.27 mg/dL 1.03 0.91 1.02  BUN/Creat Ratio 10  - $'24 16 23 21  'r$ Sodium 134 - 144 mmol/L 138 139 139  Potassium 3.5 - 5.2 mmol/L 4.8 4.9 4.8  Chloride 96 - 106 mmol/L 103 102 102  CO2 20 - 29 mmol/L $RemoveB'23 27 25  'BsZawjdb$ Calcium 8.6 - 10.2 mg/dL 10.0 10.0 10.3(H)   Hepatic Function Latest Ref Rng & Units 06/30/2019 03/20/2015 08/21/2014  Total Protein 6.0 - 8.5 g/dL 6.6 6.6 7.0  Albumin 3.6 - 4.6 g/dL 4.3 4.0 4.1  AST 0 - 40 IU/L 45(H) 29 32  ALT 0 - 44 IU/L 54(H) 30 32  Alk Phosphatase 39 - 117 IU/L 73 68 73  Total Bilirubin 0.0 - 1.2 mg/dL 0.7 0.8 0.7   CBC Latest Ref Rng & Units 06/30/2019 04/28/2019 01/26/2019  WBC 3.4 - 10.8 x10E3/uL 6.7 9.5 10.8(H)  Hemoglobin 13.0 - 17.7 g/dL 14.1 14.5 14.3  Hematocrit 37.5 - 51.0 % 43.6 44.1 43.9  Platelets 150 - 450 x10E3/uL 146(L) 187 176   Lab Results  Component Value Date   MCV 92 06/30/2019   MCV 91 04/28/2019   MCV 92.8 01/26/2019   Additional studies reviewed: I reviewed all the records from [facility] including his CT images of January 25, 2019 following his recent syncopal spell induced  Trauma.  ECHO 7//28/2020 IMPRESSIONS  1. The left ventricle has normal systolic function with an ejection fraction of 60-65%. The cavity size was normal. There is moderate asymmetric left ventricular hypertrophy. Left ventricular diastolic Doppler parameters are consistent with  pseudonormalization.  2. The right ventricle has normal systolic function. The cavity was normal. There is no increase in right ventricular wall thickness. Right ventricular systolic pressure could not be assessed.  3. Left atrial size was mildly dilated.  4. The aortic valve is abnormal. Moderate calcification of the aortic valve. Aortic valve regurgitation is trivial by color flow Doppler. Mild stenosis with a mean systolic gradient of 11 mmHg of the aortic valve.  5. The aorta is normal in size and structure when indexed to body surface area (ascending aorta measures 38 mm).  CARDIAC MRI 02/11/2019 IMPRESSION: 1.  Normal left ventricular size with mild LV hypertrophy. This appears concentric and not asymmetric. EF 60%, normal wall motion.  2. The RV is mildly dilated with mild to moderate hypokinesis, EF 35%.  3. There is no definite myocardial LGE, so no definitive evidence for prior MI, infiltrative disease, or myocarditis.  Abnormal right ventricle. However, there is no definitive evidence for cardiac sarcoidosis or hypertrophic cardiomyoapthy.  ----------------------------------------------------------------------------------------------------------- CTA CORONARY :  IMPRESSION: 03/07/2019  1. Coronary calcium score of 0. This was 0  percentile for age and sex matched control.  2. Normal coronary origin with right dominance.  3. No evidence of CAD.  Fransico Him  ADDENDUM/ Edited Result :  CTA  Aorta: Normal size. Calcifications of the aortic root and ascending aorta. No dissection.  Aortic Valve:  Trileaflet.  Moderate calcifications.  Coronary Arteries:  Normal coronary origin.  Left dominance.  RCA is a small non dominant artery that gives rise to PDA and PLVB. There is mild calcified plaque in the proximal RCA with associated stenosis of 25-49%. There is motion artifact in the mid and distal RCA.  Left main is a large artery that gives rise to LAD and LCX arteries. There is moderate calcified plaque in the mid to distal LM with associated 50-69% stenosis.  LAD is a large vessel that gives rise to a large D1 and moderate sized D2. There is moderate calcified plaque in the proximal LAD with associated 50-69% stenosis. There is severe calcified plaque in the mid LAD with associated stenosis of 70-99%.  LCX is a large dominant artery that gives rise to one large OM1 branch. There is minimal calcified plaque in the proximal to mid LCx with associated stenosis of 0-24%. There is mild calcified plaque in the mid LCx at the takeoff of a moderate sized branching OM  with associated stenosis of 25-49%. There is minimal atherosclerosis of the distal LCx with associated stenosis of 0 - 24%.  Other findings:  Normal pulmonary vein drainage into the left atrium.  Normal let atrial appendage without a thrombus.  Normal size of the pulmonary artery.  IMPRESSION: 1. Coronary calcium score of 1151. This was 66th percentile for age and sex matched control.  2.  Normal coronary origin with left dominance.  3.  Moderate to severe calcified AV cusps.  4.  Atherosclerosis of the aortic root and ascending aorta.  5. Moderate Calcified plaque in the distal LM and severe calcified plaque in the mid LAD. CAD-RADS 4b. Significant blooming may over estimate LAD stenosis.  6.  Recommend aggressive risk factor modification.  7.  Recommend cardiac catheterization.  8.  Study has been sent for [facility] analysis.  Traci Turner  ----------------------------------------------------------------------------------------- FFR FINDINGS: FFRCT analysis was performed on the original cardiac CT angiogram dataset. Diagrammatic representation of the FFRct analysis is provided in a separate PDF document in PACS. This dictation was created using the PDF document and an interactive 3D model of the results. 3D model is not available in the EMR/PACS. Normal FFR range is >0.80.  1. No significant flow limiting lesion: LM FFR = 0.99.  2. LAD: No significant flow limiting lesion: Proximal FFR = 0.98, Mid FFR = 0.96, Distal FFR = 0.89.  3. LCX: No significant flow limiting lesion: Proximal FFR = 0.97, Mid FFR = 0.94, Distal FFR = 0.91.  4. RCA: No flow limiting lesion in non dominant small RCA: Proximal FFR = 1.00, Mid FFR = 0.97, distal FFR not obtained.  IMPRESSION: 1. CT FFR analysis show no hemodynamically significant flow limiting lesions.  ------------------------------------------------------------------------------------------- CARDIAC CATH  05/02/2019  Mid LM to Dist LM lesion is 30% stenosed.  Prox LAD lesion is 30% stenosed.  Mid LAD lesion is 20% stenosed.  Dist LAD lesion is 40% stenosed.   There is evidence for mild to moderate coronary calcification without high-grade obstructive disease.  There is smooth distal tapering of the left main with 30% narrowing; 30% proximal and 20% mid ossified stenoses in the LAD with 40% diffuse stenosis in  a small caliber mid LAD vessel.  The left circumflex vessel is an angiographically normal dominant vessel; the RCA is normal nondominant vessel.  There is a small caliber ramus intermediate branch which appears to fill directly into the left ventricle resulting in ventricular opacification,  Mild transvalvular aortic gradient at 10 to 13mmHg going from the left ventricle to the aorta consistent with his known very mild aortic valve stenosis.  There is mild aortic valve calcification.  RECOMMENDATION:  The patient will continue on aspirin therapy as well as his low-dose beta-blocker therapy.  A loop recorder will be placed in the short stay following his catheterization with careful close monitoring of his rhythm status.  Continue aggressive lipid-lowering therapy with target LDL ideally in the 50s or below.  The patient was recently switched from simvastatin to rosuvastatin 40 mg.      LOOP RECORDER DEVICE CHECK: 06/08/2019 [facility] summary report received. Battery status OK. Normal device function. No new symptom episodes, tachy episodes, brady, or pause episodes. No new AF episodes. Monthly summary reports and ROV/PRN  ECHO 07/05/2019 IMPRESSIONS  1. Left ventricular ejection fraction, by estimation, is 65 to 70%. The  left ventricle has hyperdynamic function. The left ventrical has no  regional wall motion abnormalities. Left ventricular diastolic parameters  are consistent with Grade I diastolic  dysfunction (impaired relaxation).  2. Right ventricular systolic function is  normal. The right ventricular  size is mildly enlarged. There is moderately elevated pulmonary artery  systolic pressure. The estimated right ventricular systolic pressure is  16.1 mmHg.  3. No evidence of mitral valve regurgitation.  4. Tricuspid valve regurgitation is moderate.  5. Decreased motion of left and non coronary cusp. The aortic valve is  tricuspid. Aortic valve regurgitation is moderate. Mild aortic valve  stenosis. Aortic valve area, by VTI measures 1.78 cm. Aortic valve mean  gradient measures 10.5 mmHg. Aortic  valve Vmax measures 2.17 m/s.  6. Aortic dilatation noted. 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  7. The inferior vena cava is normal in size with greater than 50%  respiratory variability, suggesting right atrial pressure of 3 mmHg.   Comparison(s): No significant change from prior study. Prior images  reviewed side by side.   IMPRESSION:  1. Mild aortic stenosis   2. CAD in native artery   3. High coronary artery calcium score   4. Paroxysmal SVT (supraventricular tachycardia) ([place])   5. Hyperlipidemia with target LDL less than 70   6. RBBB (right bundle branch block)     ASSESSMENT AND PLAN: Eugene. Ressler is an 86 year old gentleman who has a remote history of prior sarcoid disease with ultimate complete remisson.  He has a history of mild hyperlipidemia which has been aggressively controlled.  Remotely, he was found to have mild coronary calcification on CT imaging.  I have been aggressive with lipid therapy in attempt to potentially induce plaque regression and previously he has been on Zetia/simvastatin 10/40 with LDL cholesterols previously documented to be 70.  In 2016 he was found to have aortic stenosis which was mild with mild aortic insufficiency with a mean gradient of 9 and peak gradient of 18.  Around Labor Day in 2020 while [place] the beach [place] bike on a hot day he had a syncopal spell.  He has no recollection of  falling and when he awakened he was in [facility] emergency room.  It does not appear that he had any significant awareness of  rhythm disturbance and apparently when EMS arrived he must have had 6 stable cardiac rhythm.  In July 2020  a follow-up echo Doppler study which still confirmed just mild aortic stenosis with a valve area of 1.7 cm.  It is unlikely that the severity of his aortic stenosis accounts for his syncope.  Recently, his ECG showed definite right bundle branch block which in the past had just been incomplete right bundle.  On his ECG of February 14, 2019 he was in sinus rhythm, although bradycardic at 53 with right bundle branch block.  QTc interval was normal.  I was concerned potential ischemic etiology scheduled him to undergo a coronary CTA for further evaluation of potential significant coronary obstructive disease. I  also scheduling him for carotid duplex imaging to assess his carotid arteries and vertebral flow.  His Zio patch findings demonstrated predominant sinus rhythm but revealed several episodes of supraventricular tachycardia, as well as episodes of isolated ectopic in isolated ventricular ectopy.  His fastest heartbeat was 190 bpm which lasted 4 beats and was consistent with SVT.  The longest SVT episode was 12.8 seconds with an average rate of 138 bpm. 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  Definitive cardiac catheterization demonstrated mild to moderate coronary calcification with nonobstructive plaque in his left main and LAD of approximately 30%.  He is now been on a more aggressive lipid management regimen consisting of maximal dose rosuvastatin 40 mg and Zetia 10 mg.  LDL is outstanding and in the 19s.  I also reviewed his most recent particle assessment done by Dr. Derinda [name] and his LDL and his most recent laboratory from January 03, 2020 was 36.  He continues to be asymptomatic.  He has not been demonstrated  to have any significant arrhythmias and is tolerating low-dose metoprolol succinate 12.5 mg daily.  His blood pressure is stable today without orthostatic change.  His aortic stenosis has been mild.  As long as he remains stable we will continue with current therapy.  I will see him in 6 months for follow-up evaluation and prior to that office visit he will undergo a follow-up echo Doppler study for reassessment of his aortic valve stenosis.   Troy Sine, MD, [facility]  02/12/2020 11:13 AM

## 2020-02-12 ENCOUNTER — Encounter: Payer: Self-pay | Admitting: Cardiovascular Disease

## 2020-02-27 ENCOUNTER — Ambulatory Visit (INDEPENDENT_AMBULATORY_CARE_PROVIDER_SITE_OTHER): Payer: Medicare Other

## 2020-02-27 DIAGNOSIS — I471 Supraventricular tachycardia: Secondary | ICD-10-CM

## 2020-02-27 LAB — CUP PACEART REMOTE DEVICE CHECK
Date Time Interrogation Session: 20210929230354
Implantable Pulse Generator Implant Date: 20201207

## 2020-02-28 DIAGNOSIS — Z86010 Personal history of colonic polyps: Secondary | ICD-10-CM | POA: Diagnosis not present

## 2020-02-28 DIAGNOSIS — Z1211 Encounter for screening for malignant neoplasm of colon: Secondary | ICD-10-CM | POA: Diagnosis not present

## 2020-02-29 NOTE — Progress Notes (Signed)
Carelink Summary Report / Loop Recorder 

## 2020-03-14 DIAGNOSIS — D1801 Hemangioma of skin and subcutaneous tissue: Secondary | ICD-10-CM | POA: Diagnosis not present

## 2020-03-14 DIAGNOSIS — D225 Melanocytic nevi of trunk: Secondary | ICD-10-CM | POA: Diagnosis not present

## 2020-03-14 DIAGNOSIS — L720 Epidermal cyst: Secondary | ICD-10-CM | POA: Diagnosis not present

## 2020-03-14 DIAGNOSIS — L821 Other seborrheic keratosis: Secondary | ICD-10-CM | POA: Diagnosis not present

## 2020-03-14 DIAGNOSIS — Z85828 Personal history of other malignant neoplasm of skin: Secondary | ICD-10-CM | POA: Diagnosis not present

## 2020-03-14 DIAGNOSIS — L570 Actinic keratosis: Secondary | ICD-10-CM | POA: Diagnosis not present

## 2020-03-29 LAB — CUP PACEART REMOTE DEVICE CHECK
Date Time Interrogation Session: 20211101230358
Implantable Pulse Generator Implant Date: 20201207

## 2020-04-02 ENCOUNTER — Ambulatory Visit (INDEPENDENT_AMBULATORY_CARE_PROVIDER_SITE_OTHER): Payer: Medicare Other

## 2020-04-02 DIAGNOSIS — R55 Syncope and collapse: Secondary | ICD-10-CM

## 2020-04-03 NOTE — Progress Notes (Signed)
Carelink Summary Report / Loop Recorder 

## 2020-04-26 ENCOUNTER — Ambulatory Visit: Payer: Medicare Other | Admitting: Cardiovascular Disease

## 2020-04-26 DIAGNOSIS — Z961 Presence of intraocular lens: Secondary | ICD-10-CM | POA: Diagnosis not present

## 2020-05-06 LAB — CUP PACEART REMOTE DEVICE CHECK
Date Time Interrogation Session: 20211204230257
Implantable Pulse Generator Implant Date: 20201207

## 2020-05-07 ENCOUNTER — Ambulatory Visit (INDEPENDENT_AMBULATORY_CARE_PROVIDER_SITE_OTHER): Payer: Medicare Other

## 2020-05-07 DIAGNOSIS — R55 Syncope and collapse: Secondary | ICD-10-CM | POA: Diagnosis not present

## 2020-05-11 DIAGNOSIS — Z008 Encounter for other general examination: Secondary | ICD-10-CM | POA: Diagnosis not present

## 2020-05-11 DIAGNOSIS — Z125 Encounter for screening for malignant neoplasm of prostate: Secondary | ICD-10-CM | POA: Diagnosis not present

## 2020-05-22 NOTE — Progress Notes (Signed)
Carelink Summary Report / Loop Recorder 

## 2020-06-11 ENCOUNTER — Ambulatory Visit (INDEPENDENT_AMBULATORY_CARE_PROVIDER_SITE_OTHER): Payer: Medicare Other

## 2020-06-11 DIAGNOSIS — R55 Syncope and collapse: Secondary | ICD-10-CM | POA: Diagnosis not present

## 2020-06-14 LAB — CUP PACEART REMOTE DEVICE CHECK
Date Time Interrogation Session: 20220115230429
Implantable Pulse Generator Implant Date: 20201207

## 2020-06-25 NOTE — Progress Notes (Signed)
Carelink Summary Report / Loop Recorder 

## 2020-07-11 ENCOUNTER — Other Ambulatory Visit: Payer: Self-pay | Admitting: Cardiovascular Disease

## 2020-07-16 ENCOUNTER — Ambulatory Visit (INDEPENDENT_AMBULATORY_CARE_PROVIDER_SITE_OTHER): Payer: Medicare Other

## 2020-07-16 DIAGNOSIS — R55 Syncope and collapse: Secondary | ICD-10-CM | POA: Diagnosis not present

## 2020-07-16 LAB — CUP PACEART REMOTE DEVICE CHECK
Date Time Interrogation Session: 20220217230659
Implantable Pulse Generator Implant Date: 20201207

## 2020-07-24 NOTE — Progress Notes (Signed)
Carelink Summary Report / Loop Recorder 

## 2020-08-09 ENCOUNTER — Other Ambulatory Visit: Payer: Self-pay

## 2020-08-09 ENCOUNTER — Ambulatory Visit (HOSPITAL_COMMUNITY): Payer: Medicare Other | Attending: Cardiology

## 2020-08-09 DIAGNOSIS — I471 Supraventricular tachycardia: Secondary | ICD-10-CM | POA: Insufficient documentation

## 2020-08-09 DIAGNOSIS — I350 Nonrheumatic aortic (valve) stenosis: Secondary | ICD-10-CM

## 2020-08-09 LAB — ECHOCARDIOGRAM COMPLETE
AR max vel: 2.98 cm2
AV Area VTI: 3.07 cm2
AV Area mean vel: 3.19 cm2
AV Mean grad: 10 mmHg
AV Peak grad: 18.3 mmHg
Ao pk vel: 2.14 m/s
Area-P 1/2: 2.91 cm2
S' Lateral: 3.1 cm

## 2020-08-15 ENCOUNTER — Encounter: Payer: Self-pay | Admitting: Cardiovascular Disease

## 2020-08-15 ENCOUNTER — Other Ambulatory Visit: Payer: Self-pay

## 2020-08-15 ENCOUNTER — Ambulatory Visit (INDEPENDENT_AMBULATORY_CARE_PROVIDER_SITE_OTHER): Payer: Medicare Other | Admitting: Cardiovascular Disease

## 2020-08-15 DIAGNOSIS — E785 Hyperlipidemia, unspecified: Secondary | ICD-10-CM

## 2020-08-15 DIAGNOSIS — I2510 Atherosclerotic heart disease of native coronary artery without angina pectoris: Secondary | ICD-10-CM | POA: Diagnosis not present

## 2020-08-15 DIAGNOSIS — I452 Bifascicular block: Secondary | ICD-10-CM | POA: Diagnosis not present

## 2020-08-15 DIAGNOSIS — I350 Nonrheumatic aortic (valve) stenosis: Secondary | ICD-10-CM

## 2020-08-15 DIAGNOSIS — R931 Abnormal findings on diagnostic imaging of heart and coronary circulation: Secondary | ICD-10-CM

## 2020-08-15 NOTE — Progress Notes (Signed)
Patient ID: Eugene [name], male   DOB: 1932-11-27, 87 y.o.   MRN: [id number]    Primary MD: Eugene [name]  HPI: Eugene [name] is a 87 y.o. male who presents to the office today for a 6 month cardiology evaluation.  Eugene [name] has a remote history of sarcoidosis and had been followed at [facility] with complete remission. He has a history of mild hyperlipidemia and has been on Vytorin 10/40. He also has a history of BPH on Proscar.  An echo Doppler study which was done to evaluate a systolic murmur in the aortic region noted in December 2013 showed normal systolic function with mild focal basal hypertrophy of the septum. He had normal systolic function. There was evidence for a moderate diffuse calcification involving the noncoronary cusp of a trileaflet aortic valve. He did have sclerosis without stenosis. Valve area was 2.55 cm. RV size was upper normal to mildly dilated. A nuclear perfusion study done in 2013 was unchanged from 6 years previously and continued to show normal perfusion.  Laboratory done by Eugene [name] in August 2015 was normal with a hemoglobin of 14.5, hematocrit 43.8.  Renal function was excellent with a BUN of 19, Cr 0.99.  Fasting glucose was 78.  Lipid studies remained excellent with a total cholesterol 125, triglycerides 14, HDL cholesterol 44, and LDL cholesterol at 60.  Angiotensin converting enzyme was normal at 37 and last year was 33.  He underwent an echo Doppler study in 03/13/2015.  This revealed an ejection fraction at 60-65%. There was grade 1 diastolic dysfunction.  He had normal LV filling pressures.  His aortic valve was calcified and there was evidence for mild aortic stenosis with a mean gradient of 9, peak gradient of 18, and valve area of 1.87 cm and mild aortic insufficiency.  There was mild mitral annular calcification with trivial MR , mild LA dilatation, and moderate TR with PA pressure 30 mm. He underwent right knee replacement  surgery by Eugene [name] in November.  Postoperatively had some swelling and lower extremity venous studies were negative.  When I saw him in 2017, he was doing well from a cardiac standpoint.   Specifically, he denied any episodes of chest pain.  He was able to play tennis again following his knee surgery.  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  He had also undergone follow-up CT imaging asked and was found to have a new irregularly-shaped nodular density in the left lower lobe, alt to represent a possible focus of active infection/inflammation.  A follow-up CT was recommended.  After a course of antimicrobial therapy.  His CT also demonstrated a stable ectatic.  Borderline aneurysmal ascending thoracic aorta at 3.9 cm.  He had evidence for coronary calcification.   Laboratory in 2017 by his primary physician showed total cholesterol was 134, triglycerides 79, HDL 43, and LDL 75 on his current dose of ezetimibe 10 mg and simvastatin 40 mg.   When I saw him in March 2019 he was remaining active and was walking on elliptical machine at least 3-4 times per week and was playing tennis 2 times per week.  He denied any chest pain or shortness of breath.  He denies palpitations.   In 2019, he was found to have a pulmonary infiltrate for which she was evaluated by  Eugene [name].  He had undergone bronchoscopy with biopsy and ultimately underwent an evaluation at [facility] and this nodule had not significantly changed over the past 16 years.  I evaluated him in May 2020 and a telemedicine visit.  At that time he denied any chest pain, palpitations, presyncope or syncope.  He denied any PND orthopnea.  He had previously been followed by Eugene [name] but he had recently signed up for concierge medicine with Eugene [name].  During that evaluation I recommended he  undergo a follow-up echo Doppler study to reassess his aortic valve since in 2016 he was found to have mild aortic stenosis.  He underwent an echo Doppler study on December 21, 2018 which continues to show normal systolic function with EF at 60 to 65%, and moderate asymmetric left ventricular hypertrophy.  There was grade 2 diastolic dysfunction.  There was mild aortic stenosis with moderate calcification of the aortic valve.  He had a mean systolic gradient of 11 mm and his peak gradient was 20.8 mm.  Calculated aortic valve was 1.7 cm and was consistent with mild aortic stenosis.  On January 25, 2019 while biking at [address] he apparently had a syncopal spell.  He was found by bystanders.  Apparently he is amnesic to the event and when he woke up he was at [facility] in the emergency room.  It did not appear that he was aware when he had fallen since there was no apparent marks on his hands to break a fall.  A CT scan of the maxillofacial area demonstrated a comminuted displaced bilateral nasal bone fracture as well as fracture of the right zygomatic arch.  There was a frontal scalp hematoma.  He had extensive CT imaging of his chest abdomen and pelvis.  Of note, on his chest CT he was felt to have severe coronary artery atherosclerotic calcifications.  There was no evidence for acute traumatic injury within the chest abdomen or pelvis.  He had a 3.6 cm indeterminate liver mass, 4.3 cm indeterminate left renal lesion and had multiple bladder stones and bladder diverticula likely related to chronic outlet obstruction from an enlarged prostate and had nonobstructive bilateral nephrolithiasis.  Upon his return to [facility], he was evaluated by Eugene [name] in the [facility] primary care.  He subsequently was evaluated by Eugene [name] in our office for cardiology evaluation during my absence.  With his memory absence, it is felt that he had a concussion.  When seen by Eugene [name],  she reviewed his extensive evaluation and recent echo which confirmed that he did not have significant aortic stenosis.  Because of his remote history of sarcoidosis, she scheduled him to undergo cardiac MRI imaging to assess for scar/infiltrative disease.  He was also scheduled to undergo 14-day Ziotelemetry monitor.    I saw him in follow-up of Eugene [name] on February 14, 2019.  At the time of his evaluation he had not yet received his Zio patch monitor.  I reviewed extensively his records from [facility] as well as the records of Eugene [name].  His MR scan was reviewed and did not show any definitive myocardial [name] gadolinium enhancement so there was no definitive evidence for prior myocardial infarction, infiltrative disease or myocarditis.  LVEF was approximately 60%.  His RV EF was reported at 35% however when reviewed by Eugene [name] she had felt in 20 function was somewhat better.  I scheduled him to undergo coronary CTA to assess potential CAD  disease with silent ischemia leading to his clinical event.  He also underwent carotid duplex imaging.Marland Kitchen  His 14-day ZIO monitor showed predominant sinus rhythm with right bundle branch block.  He did have an average heart rate at 62 bpm with a minimum of 42 while sleeping and maximal heart rate of 190.  He had several short bursts of SVT the fastest interval lasting 4 beats with a maximum rate of 190.  The longest SVT episode was 12.8 seconds with an average heart rate of 138 bpm.  There was no VT or atrial fibrillation or high degree block or pauses noted.  Carotid duplex imaging essentially normal with normal antegrade vertebral and subclavian flow and was without evidence for internal carotid stenoses.  I received an initial report of his CT angiogram which came back as normal.  We had notified him of the results.  Apparently, there was an addendum to the report port when it was reinterpreted adjusted multivessel CAD with a calcium score  of 1151 with moderate calcific plaque in the mid distal left main, severe calcific plaque in the mid LAD mild calcific plaque in the circumflex and RCA.  There was concern of possible significant blooming artifact which may have contributed to overestimate of LAD stenoses.  Subsequent FFR analysis was normal and did not reveal any hemodynamic significant stenoses.   I had a long conversation with Mr. Urenda as well as called his daughter in [place] in follow-up of his April 08, 2019 office visit.  At that time I recommended initiation of aspirin 81 mg and initiated metoprolol succinate 12.5 mg daily.  I also discussed changing him to more aggressive lipid-lowering therapy and he recently was started on rosuvastatin 40 mg in place of simvastatin. When I last saw him in the office on April 18, 2019 I recommended definitive cardiac catheterization in light of his coronary CT angio findings also recommended insertion of a loop monitor for potential long-term monitoring of potential cardiac arrhythmia.   Mr. Im underwent cardiac catheterization on May 02, 2019 by me. There was mild to moderate coronary calcification without high-grade obstructive disease. There was a smooth distal tapering of his left main with 30% narrowing, 30% proximal and 20% mid calcified stenoses in the LAD with 40% diffuse stenosis in a small caliber mid LAD vessel. The left circumflex vessel was angiographically normal and dominant. The RCA was a normal nondominant vessel. Of note, there was a small caliber ramus intermediate branch which appeared to fill directly into the left ventricle resulting in left ventricular opacification. He had only very mild aortic stenosis with a transvalvular aortic gradient at 10 to 12 mm. There was mild aortic valve calcification. There that day before going home he underwent insertion of a loop recorder by Dr. Sallyanne Kuster.  I saw him in follow-up of his catheterization and loop recorder on May 19, 2019.  Over the last several weeks, Mr. Guilmette has felt well. He denies any episodes of chest pain or shortness of breath. Loop recorder interrogation to date has not shown any events.   Since I saw him in December 2020, he has been undergoing monthly device checks of his loop recorder.  He has normal device function.  There are no new symptom episodes or episodes of tachycardia, bradycardia or pauses.  There were no new AF episodes.  He underwent a follow-up echo Doppler study on July 05, 2019.  This essentially is unchanged from his prior evaluation and continues to show hyperdynamic LV function.  He has grade 1 diastolic dysfunction.  There is decreased motion of his left and noncoronary cusp of a trileaflet aortic valve.  Aortic stenosis is mild with a mean gradient of 10.5 and peak gradient of 18.8.  There is moderate aortic insufficiency.  Ascending aorta is mildly dilated at 40 mm.  There is moderate tricuspid regurgitation.  I  saw him in February 2021 at which time he continued to feel well.  He specifically denied any chest pain, PND orthopnea, presyncope or syncope or awareness of palpitations.    He underwent recent laboratory now on his regimen of Zetia plus rosuvastatin 40 mg, and LDL cholesterol is now markedly reduced from 72 down to 30 with combination treatment.  There is minimally elevated AST and ALT levels of 45 and 5 respectively.  He remains asymptomatic.  I last saw him on February 10, 2020.  He had seen Eugene [name] who is his primary provider andunderwent recent laboratory which I was able to obtain today after his office visit and called him to discuss the findings with him.  Laboratory from January 03, 2020 was excellent.  LDL cholesterol was 36.  He underwent particle assessment and LDL particle number was optimal at 905 as was small LDL particles at 138.  April lipoprotein B was excellent at 49 and LP(a) was excellent at 27.  He continues to be active and plays tennis  and does elliptical.  He also rides his bike.  He denies any anginal symptoms.  He continues to have monthly loop recorder checks.  His most recent evaluation did not reveal any events.   He was asymptomatic and continued to be on low-dose metoprolol succinate 12.5 mg daily with stable blood pressure.  Since I last saw him, he underwent a follow-up echo Doppler study on August 09, 2020.  This essentially was unchanged from previously and showed an EF of 60 to 65% with grade 1 diastolic dysfunction.  There was mild asymmetric LVH at of the basal septal segment.  There was moderate calcification of a trileaflet aortic valve with mild aortic stenosis.  His mean gradient was 10 mmHg with a peak gradient of 18.3.  Continues to be on Zetia and rosuvastatin 40 mg for hyperlipidemia and metoprolol succinate 12.5 mg I denies any palpitations.  He continues to undergo remote loop recorder check at 7-month intervals.  He has not had any clinically significant events and has normal battery status.  He presents for evaluation.  Past Medical History:  Diagnosis Date   Bladder stones    BPH (benign prostatic hyperplasia)    Hernia of abdominal cavity    History of kidney stones    Hyperlipidemia    Incomplete right bundle branch block    Low O2 saturation 04/04/2015   Wife says O2 sats are in the low 90s base line.  Now in the 104s.  Will do incentive spirometry   Primary localized osteoarthritis of right knee 04/02/2015   Sarcoidosis    diagnosed 8-9 yrs ago   Upper GI bleed 04/04/2015   Patient had coffee ground emesis that was Guiac positive on Monday.  EGD scheduled for 04/04/2015. Started on IV Protonix    Past Surgical History:  Procedure Laterality Date   ANTERIOR CERVICAL DECOMP/DISCECTOMY FUSION  2007   BRONCHOSCOPY  2007   CARDIOVASCULAR STRESS TEST  04/29/2012   normal nuclear stress study.    CYSTOSCOPY  2004   DOPPLER ECHOCARDIOGRAPHY  04/29/2012   EF not noted. small perimembranous  ventricular septal defect. small left to right ventricular shunt. moderate diffuse calcification involving noncoronary cusp of the aortic valve.    ESOPHAGOGASTRODUODENOSCOPY  04/04/2015   ESOPHAGOGASTRODUODENOSCOPY N/A 04/04/2015   Procedure: ESOPHAGOGASTRODUODENOSCOPY (EGD);  Surgeon: Ruffin Frederick, MD;  Location: [facility] ENDOSCOPY;  Service: Gastroenterology;  Laterality: N/A;   INGUINAL HERNIA REPAIR Right 2009   LEFT HEART CATH AND CORONARY ANGIOGRAPHY N/A 05/02/2019   Procedure: LEFT HEART CATH AND CORONARY ANGIOGRAPHY;  Surgeon: Lennette Bihari, MD;  Location: [facility] INVASIVE CV LAB;  Service: Cardiovascular;  Laterality: N/A;   LOOP RECORDER INSERTION N/A 05/02/2019   Procedure: LOOP RECORDER INSERTION;  Surgeon: Thurmon Fair, MD;  Location: [facility] INVASIVE CV LAB;  Service: Cardiovascular;  Laterality: N/A;   TOTAL KNEE ARTHROPLASTY Right 04/02/2015   Procedure: TOTAL KNEE ARTHROPLASTY;  Surgeon: Salvatore Marvel, MD;  Location: [facility] OR;  Service: Orthopedics;  Laterality: Right;   VIDEO BRONCHOSCOPY Bilateral 09/23/2017   Procedure: VIDEO BRONCHOSCOPY WITH FLUORO;  Surgeon: Oretha Milch, MD;  Location: [facility] ENDOSCOPY;  Service: Cardiopulmonary;  Laterality: Bilateral;    No Known Allergies  Current Outpatient Medications  Medication Sig Dispense Refill   aspirin EC 81 MG tablet Take 1 tablet (81 mg total) by mouth daily. 90 tablet 3   Cholecalciferol (VITAMIN D) 50 MCG (2000 UT) CAPS Take 2,000 Units by mouth daily.      ezetimibe (ZETIA) 10 MG tablet TAKE (1) TABLET DAILY AT BEDTIME. 90 tablet 3   finasteride (PROSCAR) 5 MG tablet Take 5 mg by mouth every evening.      metoprolol succinate (TOPROL-XL) 25 MG 24 hr tablet TAKE 1/2 TABLET DAILY. 45 tablet 0   potassium citrate (UROCIT-K) 10 MEQ (1080 MG) SR tablet Take 10 mEq by mouth daily.     rosuvastatin (CRESTOR) 40 MG tablet Take 40 mg by mouth daily.     No current facility-administered medications for this visit.     Social History   Socioeconomic History   Marital status: Married    Spouse name: Not on file   Number of children: Not on file   Years of education: Not on file   Highest education level: Not on file  Occupational History   Not on file  Tobacco Use   Smoking status: Former Smoker    Types: Pipe    Quit date: 05/26/1972    Years since quitting: 48.2   Smokeless tobacco: Never Used   Tobacco comment: Quit 43 year  Substance and Sexual Activity   Alcohol use: Yes    Alcohol/week: 4.0 standard drinks    Types: 4 Standard drinks or equivalent per week   Drug use: No   Sexual activity: Not on file  Other Topics Concern   Not on file  Social History Narrative   Not on file   Social Determinants of Health   Financial Resource Strain: Not on file  Food Insecurity: Not on file  Transportation Needs: Not on file  Physical Activity: Not on file  Stress: Not on file  Social Connections: Not on file  Intimate Partner Violence: Not on file   Socially he is married. He  9 grandchildren. One son lives in [place], a daughter lives in [place]. There is no tobacco use. He does take occasional alcohol. He does exercise.  Family History  Problem Relation Age of Onset   Ovarian cancer Mother    Heart attack Father 100    ROS General: Negative; No fevers, chills, or night sweats;  HEENT: Negative;  No changes in vision or hearing, sinus congestion, difficulty swallowing Pulmonary: Negative; No cough, wheezing, shortness of breath, hemoptysis Cardiovascular: See HPI GI: Negative; No nausea, vomiting, diarrhea, or abdominal pain GU: Negative; No dysuria, hematuria, or difficulty voiding Musculoskeletal: Recent bilateral nasal bone fractures and fracture of right zygomatic arch Hematologic/Oncology: Negative; no easy bruising, bleeding Remote history of sarcoidosis, completely resolved Endocrine: Negative; no heat/cold intolerance; no diabetes Neuro: Amnesic to his  syncopal spell Skin: Negative; No rashes or skin lesions Psychiatric: Negative; No behavioral problems, depression Sleep: Negative; No snoring, daytime sleepiness, hypersomnolence, bruxism, restless legs, hypnogognic hallucinations, no cataplexy Other comprehensive 14 point system review is negative.   PE BP (!) 120/58 (BP Location: Left Arm, Patient Position: Sitting)    Pulse (!) 57    Ht $R'5\' 9"'vA$  (1.753 m)    Wt 195 lb (88.5 kg)    SpO2 93%    BMI 28.80 kg/m    Repeat blood pressure by me was 122/70 supine 120/68 standing  Wt Readings from Last 3 Encounters:  08/15/20 195 lb (88.5 kg)  02/10/20 191 lb 12.8 oz (87 kg)  07/08/19 192 lb 9.6 oz (87.4 kg)     Physical Exam BP (!) 120/58 (BP Location: Left Arm, Patient Position: Sitting)    Pulse (!) 57    Ht $R'5\' 9"'Fi$  (1.753 m)    Wt 195 lb (88.5 kg)    SpO2 93%    BMI 28.80 kg/m  General: Alert, oriented, no distress.  Skin: normal turgor, no rashes, warm and dry HEENT: Normocephalic, atraumatic. Pupils equal round and reactive to light; sclera anicteric; extraocular muscles intact; Fundi ** Nose without nasal septal hypertrophy Mouth/Parynx benign; Mallinpatti scale Neck: No JVD, no carotid bruits; normal carotid upstroke Lungs: clear to ausculatation and percussion; no wheezing or rales Chest wall: without tenderness to palpitation Heart: PMI not displaced, RRR, s1 s2 normal, 1/6 systolic murmur, no diastolic murmur, no rubs, gallops, thrills, or heaves Abdomen: soft, nontender; no hepatosplenomehaly, BS+; abdominal aorta nontender and not dilated by palpation. Back: no CVA tenderness Pulses 2+ Musculoskeletal: full range of motion, normal strength, no joint deformities Extremities: no clubbing cyanosis or edema, Homan's sign negative  Neurologic: grossly nonfocal; Cranial nerves grossly wnl Psychologic: Normal mood and affect    General: Alert, oriented, no distress.  Skin: normal turgor, no rashes, warm and dry HEENT:  Normocephalic, atraumatic. Pupils equal round and reactive to light; sclera anicteric; extraocular muscles intact;  Nose without nasal septal hypertrophy Mouth/Parynx benign; Mallinpatti scale 2 Neck: No JVD, no carotid bruits; normal carotid upstroke Lungs: clear to ausculatation and percussion; no wheezing or rales Chest wall: without tenderness to palpitation Heart: PMI not displaced, RRR, s1 s2 normal, 2/6 early peaking systolic murmur consistent with his mild aortic valve stenosis, no diastolic murmur, no rubs, gallops, thrills, or heaves Abdomen: soft, nontender; no hepatosplenomehaly, BS+; abdominal aorta nontender and not dilated by palpation. Back: no CVA tenderness Pulses 2+ Musculoskeletal: full range of motion, normal strength, no joint deformities Extremities: no clubbing cyanosis or edema, Homan's sign negative  Neurologic: grossly nonfocal; Cranial nerves grossly wnl Psychologic: Normal mood and affect  ECG (independently read by me): Sinus bradycardia at 57, RBBB with repolarization changes  September 2021 ECG (independently read by me): Sinus bradycardia at 56, RBBB with repolarization, LVH by voltage; normal intervals, no ectopy  February 12, 20212 ECG (independently read by me): Sinus bradycardia 58 bpm, right bundle branch block with repolarization changes.  Borderline criteria for LVH.  No  ectopy.  QTc interval 447 ms  December 2020 ECG (independently read by me): Sinus bradycardia at 54 bpm, right bundle branch block with repolarization changes.  Left axis deviation.  Borderline voltage criteria for LVH  April 08, 2019 ECG (independently read by me): Sinus rhythm at 65 bpm, left axis deviation, right bundle branch block with repolarization changes.  LVH by voltage.  QTc interval 453 ms.  PR interval 146 ms.  September 2020 ECG (independently read by me): Sinus bradycardia at 53 bpm, right bundle branch block with repolarization changes.  QRS duration 156 ms.  Mild  LVH by voltage in aVL.  QTc interval 439 ms  March 2019 ECG (independently read by me): normal sinus rhythm at 65 bpm.  One isolated PVC.  Incomplete right bundle branch block.  Borderline LVH.  Normal intervals.  February 2018 ECG (independently read by me): Sinus bradycardia at 56 bpm.  Mild sinus arrhythmia.  Mild LVH.  Normal intervals.  No ST segment changes.  February 2017 ECG (independently read by me):  Normal sinus rhythm at 63 bpm.  Early transition.  No ST segment changes.  December 2015 ECG (independently read by me): Normal sinus rhythm at 66 bpm.  Mild RV conduction delay.  Early transition.  December 2014 ECG: Sinus rhythm with an occasional PVC; normal intervals.  LABS: I personally reviewed the laboratory from [place] done on 02/19/2016.  BUN 18, creatinine 0.9.  Ingrown 14.7, hematocrit 44.9.  Normal LFTs.  TSH 2.08.  April lipoprotein B 66.  Normal PSA.  Lipid studies as noted above.  Lpid studies from March 03, 2017: Total cholesterol 134, HDL 39, LDL 70, triglycerides 124.     After he left the office I was able to obtain accurate from Dr. Sandi Mariscal from his blood work on Mr. Pinn from December 2021: Cholesterol 102, HDL 45, triglycerides 65, and LDL 43. Hypoprotein particle analysis small LDL particles excellent at 281. CMP normal. CBC normal. A1c 5.8. Vitamin D 26.3.  BMP Latest Ref Rng & Units 06/30/2019 04/28/2019 02/24/2019  Glucose 65 - 99 mg/dL 88 106(H) 88  BUN 8 - 27 mg/dL $Remove'16 21 21  'BZhKJuX$ Creatinine 0.76 - 1.27 mg/dL 1.03 0.91 1.02  BUN/Creat Ratio 10 - $Re'24 16 23 21  'pIP$ Sodium 134 - 144 mmol/L 138 139 139  Potassium 3.5 - 5.2 mmol/L 4.8 4.9 4.8  Chloride 96 - 106 mmol/L 103 102 102  CO2 20 - 29 mmol/L $RemoveB'23 27 25  'yETjtCGH$ Calcium 8.6 - 10.2 mg/dL 10.0 10.0 10.3(H)   Hepatic Function Latest Ref Rng & Units 06/30/2019 03/20/2015 08/21/2014  Total Protein 6.0 - 8.5 g/dL 6.6 6.6 7.0  Albumin 3.6 - 4.6 g/dL 4.3 4.0 4.1  AST 0 - 40 IU/L 45(H) 29 32  ALT 0 - 44  IU/L 54(H) 30 32  Alk Phosphatase 39 - 117 IU/L 73 68 73  Total Bilirubin 0.0 - 1.2 mg/dL 0.7 0.8 0.7   CBC Latest Ref Rng & Units 06/30/2019 04/28/2019 01/26/2019  WBC 3.4 - 10.8 x10E3/uL 6.7 9.5 10.8(H)  Hemoglobin 13.0 - 17.7 g/dL 14.1 14.5 14.3  Hematocrit 37.5 - 51.0 % 43.6 44.1 43.9  Platelets 150 - 450 x10E3/uL 146(L) 187 176   Lab Results  Component Value Date   MCV 92 06/30/2019   MCV 91 04/28/2019   MCV 92.8 01/26/2019   Additional studies reviewed: I reviewed all the records from [facility] including his CT images of January 25, 2019 following his recent syncopal  spell induced  Trauma.  ECHO 7//28/2020 IMPRESSIONS  1. The left ventricle has normal systolic function with an ejection fraction of 60-65%. The cavity size was normal. There is moderate asymmetric left ventricular hypertrophy. Left ventricular diastolic Doppler parameters are consistent with  pseudonormalization.  2. The right ventricle has normal systolic function. The cavity was normal. There is no increase in right ventricular wall thickness. Right ventricular systolic pressure could not be assessed.  3. Left atrial size was mildly dilated.  4. The aortic valve is abnormal. Moderate calcification of the aortic valve. Aortic valve regurgitation is trivial by color flow Doppler. Mild stenosis with a mean systolic gradient of 11 mmHg of the aortic valve.  5. The aorta is normal in size and structure when indexed to body surface area (ascending aorta measures 38 mm).  CARDIAC MRI 02/11/2019 IMPRESSION: 1. Normal left ventricular size with mild LV hypertrophy. This appears concentric and not asymmetric. EF 60%, normal wall motion.  2. The RV is mildly dilated with mild to moderate hypokinesis, EF 35%.  3. There is no definite myocardial LGE, so no definitive evidence for prior MI, infiltrative disease, or myocarditis.  Abnormal right ventricle. However, there is no definitive evidence for  cardiac sarcoidosis or hypertrophic cardiomyoapthy.  ----------------------------------------------------------------------------------------------------------- CTA CORONARY :  IMPRESSION: 03/07/2019  1. Coronary calcium score of 0. This was 0 percentile for age and sex matched control.  2. Normal coronary origin with right dominance.  3. No evidence of CAD.  Fransico Him  ADDENDUM/ Edited Result :  CTA  Aorta: Normal size. Calcifications of the aortic root and ascending aorta. No dissection.  Aortic Valve:  Trileaflet.  Moderate calcifications.  Coronary Arteries:  Normal coronary origin.  Left dominance.  RCA is a small non dominant artery that gives rise to PDA and PLVB. There is mild calcified plaque in the proximal RCA with associated stenosis of 25-49%. There is motion artifact in the mid and distal RCA.  Left main is a large artery that gives rise to LAD and LCX arteries. There is moderate calcified plaque in the mid to distal LM with associated 50-69% stenosis.  LAD is a large vessel that gives rise to a large D1 and moderate sized D2. There is moderate calcified plaque in the proximal LAD with associated 50-69% stenosis. There is severe calcified plaque in the mid LAD with associated stenosis of 70-99%.  LCX is a large dominant artery that gives rise to one large OM1 branch. There is minimal calcified plaque in the proximal to mid LCx with associated stenosis of 0-24%. There is mild calcified plaque in the mid LCx at the takeoff of a moderate sized branching OM with associated stenosis of 25-49%. There is minimal atherosclerosis of the distal LCx with associated stenosis of 0 - 24%.  Other findings:  Normal pulmonary vein drainage into the left atrium.  Normal let atrial appendage without a thrombus.  Normal size of the pulmonary artery.  IMPRESSION: 1. Coronary calcium score of 1151. This was 66th percentile for age and sex matched  control.  2.  Normal coronary origin with left dominance.  3.  Moderate to severe calcified AV cusps.  4.  Atherosclerosis of the aortic root and ascending aorta.  5. Moderate Calcified plaque in the distal LM and severe calcified plaque in the mid LAD. CAD-RADS 4b. Significant blooming may over estimate LAD stenosis.  6.  Recommend aggressive risk factor modification.  7.  Recommend cardiac catheterization.  8.  Study has been sent  for FFR analysis.  Traci Turner  ----------------------------------------------------------------------------------------- FFR FINDINGS: FFRCT analysis was performed on the original cardiac CT angiogram dataset. Diagrammatic representation of the FFRct analysis is provided in a separate PDF document in PACS. This dictation was created using the PDF document and an interactive 3D model of the results. 3D model is not available in the EMR/PACS. Normal FFR range is >0.80.  1. No significant flow limiting lesion: LM FFR = 0.99.  2. LAD: No significant flow limiting lesion: Proximal FFR = 0.98, Mid FFR = 0.96, Distal FFR = 0.89.  3. LCX: No significant flow limiting lesion: Proximal FFR = 0.97, Mid FFR = 0.94, Distal FFR = 0.91.  4. RCA: No flow limiting lesion in non dominant small RCA: Proximal FFR = 1.00, Mid FFR = 0.97, distal FFR not obtained.  IMPRESSION: 1. CT FFR analysis show no hemodynamically significant flow limiting lesions.  ------------------------------------------------------------------------------------------- CARDIAC CATH 05/02/2019  Mid LM to Dist LM lesion is 30% stenosed.  Prox LAD lesion is 30% stenosed.  Mid LAD lesion is 20% stenosed.  Dist LAD lesion is 40% stenosed.   There is evidence for mild to moderate coronary calcification without high-grade obstructive disease.  There is smooth distal tapering of the left main with 30% narrowing; 30% proximal and 20% mid ossified stenoses in the LAD with  40% diffuse stenosis in a small caliber mid LAD vessel.  The left circumflex vessel is an angiographically normal dominant vessel; the RCA is normal nondominant vessel.  There is a small caliber ramus intermediate branch which appears to fill directly into the left ventricle resulting in ventricular opacification,  Mild transvalvular aortic gradient at 10 to going from the left ventricle to the aorta consistent with his known very mild aortic valve stenosis.  There is mild aortic valve calcification.  RECOMMENDATION:  The patient will continue on aspirin therapy as well as his low-dose beta-blocker therapy.  A loop recorder will be placed in the short stay following his catheterization with careful close monitoring of his rhythm status.  Continue aggressive lipid-lowering therapy with target LDL ideally in the 50s or below.  The patient was recently switched from simvastatin to rosuvastatin 40 mg.      LOOP RECORDER DEVICE CHECK: 06/08/2019 [facility] summary report received. Battery status OK. Normal device function. No new symptom episodes, tachy episodes, brady, or pause episodes. No new AF episodes. Monthly summary reports and ROV/PRN  ECHO 07/05/2019 IMPRESSIONS  1. Left ventricular ejection fraction, by estimation, is 65 to 70%. The  left ventricle has hyperdynamic function. The left ventrical has no  regional wall motion abnormalities. Left ventricular diastolic parameters  are consistent with Grade I diastolic  dysfunction (impaired relaxation).  2. Right ventricular systolic function is normal. The right ventricular  size is mildly enlarged. There is moderately elevated pulmonary artery  systolic pressure. The estimated right ventricular systolic pressure is  40.9 mmHg.  3. No evidence of mitral valve regurgitation.  4. Tricuspid valve regurgitation is moderate.  5. Decreased motion of left and non coronary cusp. The aortic valve is  tricuspid. Aortic valve  regurgitation is moderate. Mild aortic valve  stenosis. Aortic valve area, by VTI measures 1.78 cm. Aortic valve mean  gradient measures 10.5 mmHg. Aortic  valve Vmax measures 2.17 m/s.  6. Aortic dilatation noted. 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  7. The inferior vena cava is normal in size with greater than 50%  respiratory variability, suggesting right atrial  pressure of 3 mmHg.   Comparison(s): No significant change from prior study. Prior images  reviewed side by side.    ECHO 08/09/2020: IMPRESSIONS  1. Left ventricular ejection fraction, by estimation, is 60 to 65%. The  left ventricle has normal function. The left ventricle has no regional  wall motion abnormalities. There is mild asymmetric left ventricular  hypertrophy of the basal-septal segment.  Left ventricular diastolic parameters are consistent with Grade I  diastolic dysfunction (impaired relaxation).  2. Right ventricular systolic function is normal. The right ventricular  size is mildly enlarged.  3. The mitral valve is grossly normal. No evidence of mitral valve  regurgitation. No evidence of mitral stenosis.  4. The aortic valve is tricuspid. There is moderate calcification of the  aortic valve. There is moderate thickening of the aortic valve. Aortic  valve regurgitation is mild. Mild aortic valve stenosis. Aortic valve  area, by VTI measures 3.07 cm. Aortic  valve mean gradient measures 10.0 mmHg. Aortic valve Vmax measures 2.14  m/s.   Comparison(s): No significant change from prior study. LV function  unchanged. Aortic stenosis remains mild. Mild AI on this study.    IMPRESSION:  1. CAD in native artery   2. Aortic valve stenosis, mild   3. RBBB (right bundle branch block)   4. Hyperlipidemia with target LDL less than 70   5. High coronary artery calcium score     ASSESSMENT AND PLAN: Mr. Champine is an 87 year old gentleman who has a remote history of prior  sarcoid disease with ultimate complete remisson.  He has a history of mild hyperlipidemia which has been aggressively controlled.  Remotely, he was found to have mild coronary calcification on CT imaging.  I have been aggressive with lipid therapy in attempt to potentially induce plaque regression and previously he has been on Zetia/simvastatin 10/40 with LDL cholesterols previously documented to be 70.  In 2016 he was found to have aortic stenosis which was mild with mild aortic insufficiency with a mean gradient of 9 and peak gradient of 18.  Around Labor Day in 2020 while [place] the beach [place] bike on a hot day he had a syncopal spell.  He has no recollection of falling and when he awakened he was in [facility] emergency room.  It does not appear that he had any significant awareness of rhythm disturbance and apparently when EMS arrived he must have had 6 stable cardiac rhythm.  In July 2020  a follow-up echo Doppler study confirmed just mild aortic stenosis with a valve area of 1.7 cm.  It is unlikely that the severity of his aortic stenosis accounts for his syncope.  Recently, his ECG showed definite right bundle branch block which in the past had just been incomplete right bundle.  On his ECG of February 14, 2019 he was in sinus rhythm, although bradycardic at 53 with right bundle branch block.  QTc interval was normal.  I was concerned potential ischemic etiology scheduled him to undergo a coronary CTA for further evaluation of potential significant coronary obstructive disease. I  also scheduling him for carotid duplex imaging to assess his carotid arteries and vertebral flow.  His Zio patch findings demonstrated predominant sinus rhythm but revealed several episodes of supraventricular tachycardia, as well as episodes of isolated ectopic in isolated ventricular ectopy.  His fastest heartbeat was 190 bpm which lasted 4 beats and was consistent with SVT.  The longest SVT episode was 12.8 seconds with an  average rate  of 138 bpm. 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  Definitive cardiac catheterization demonstrated mild to moderate coronary calcification with nonobstructive plaque in his left main and LAD of approximately 30%. He continues to remain asymptomatic and is on a more aggressive treatment to attempt plaque regression rosuvastatin 40 mg and Zetia 10 mg.  Lipid studies in February 2021 showed an LDL of 30.  After he left the office I was able to obtain his most recent laboratory from [name].  LDL continues to be excellent and in August 2021 was 36 and most recently in December 2021 was 70.  Small LDL particle number is excellent at 281.  He is unaware of any palpitations and is tolerating low-dose metoprolol succinate 12.5 mg daily.  His loop recorder analysis have not shown any significant arrhythmia.  I reviewed his most recent echo Doppler study which essentially is unchanged and continues to show normal systolic function and mild aortic stenosis with a mean gradient of 10 and peak gradient of 18.3.  Clinically he is doing remarkably well.  We will continue with current therapy as prescribed.  He would like to continue to be seen at 55-month intervals.  I will see him in 6 months for reevaluation or sooner as needed.   Troy Sine, MD, [facility]  08/16/2020 6:25 PM

## 2020-08-15 NOTE — Patient Instructions (Signed)
Medication Instructions:  Continue current medications  *If you need a refill on your cardiac medications before your next appointment, please call your pharmacy*   Lab Work: None Ordered   Testing/Procedures: None Ordered   Follow-Up: At [facility], you and your health needs are our priority.  As part of our continuing mission to provide you with exceptional heart care, we have created designated Provider Care Teams.  These Care Teams include your primary Cardiologist (physician) and Advanced Practice Providers (APPs -  Physician Assistants and Nurse Practitioners) who all work together to provide you with the care you need, when you need it.  We recommend signing up for the patient portal called "MyChart".  Sign up information is provided on this After Visit Summary.  MyChart is used to connect with patients for Virtual Visits (Telemedicine).  Patients are able to view lab/test results, encounter notes, upcoming appointments, etc.  Non-urgent messages can be sent to your provider as well.   To learn more about what you can do with MyChart, go to [email or web address].    Your next appointment:   6 month(s)  The format for your next appointment:   In Person  Provider:   You may see Thomas Kelly, MD or one of the following Advanced Practice Providers on your designated Care Team:    Hao Meng, PA-C  Angela Duke, PA-C or   Krista Kroeger, PA-C     

## 2020-08-16 ENCOUNTER — Encounter: Payer: Self-pay | Admitting: Cardiovascular Disease

## 2020-08-19 LAB — CUP PACEART REMOTE DEVICE CHECK
Date Time Interrogation Session: 20220322230251
Implantable Pulse Generator Implant Date: 20201207

## 2020-08-20 ENCOUNTER — Ambulatory Visit (INDEPENDENT_AMBULATORY_CARE_PROVIDER_SITE_OTHER): Payer: Medicare Other

## 2020-08-20 DIAGNOSIS — R55 Syncope and collapse: Secondary | ICD-10-CM | POA: Diagnosis not present

## 2020-08-31 NOTE — Progress Notes (Signed)
Carelink Summary Report / Loop Recorder 

## 2020-09-17 ENCOUNTER — Ambulatory Visit: Payer: Medicare Other

## 2020-09-18 LAB — CUP PACEART REMOTE DEVICE CHECK
Date Time Interrogation Session: 20220424230750
Implantable Pulse Generator Implant Date: 20201207

## 2020-09-21 ENCOUNTER — Other Ambulatory Visit: Payer: Self-pay

## 2020-09-21 ENCOUNTER — Telehealth: Payer: Self-pay | Admitting: Cardiovascular Disease

## 2020-09-21 MED ORDER — METOPROLOL SUCCINATE ER 25 MG PO TB24: 0.5000 | ORAL_TABLET | Freq: Every day | ORAL | 2 refills | Status: DC

## 2020-09-21 NOTE — Telephone Encounter (Signed)
*  STAT* If patient is at the pharmacy, call can be transferred to refill team.   1. Which medications need to be refilled? (please list name of each medication and dose if known) Metoprolol   2. Which pharmacy/location (including street and city if local pharmacy) is medication to be sent to? [facility] 3. Do they need a 30 day or 90 day supply? 90 days- Pt need this today please, going out of twn tomorrow

## 2020-10-19 ENCOUNTER — Ambulatory Visit: Payer: Medicare Other

## 2020-10-21 LAB — CUP PACEART REMOTE DEVICE CHECK
Date Time Interrogation Session: 20220527230435
Implantable Pulse Generator Implant Date: 20201207

## 2020-11-21 ENCOUNTER — Ambulatory Visit (INDEPENDENT_AMBULATORY_CARE_PROVIDER_SITE_OTHER): Payer: Medicare Other

## 2020-11-21 DIAGNOSIS — R55 Syncope and collapse: Secondary | ICD-10-CM

## 2020-11-22 LAB — CUP PACEART REMOTE DEVICE CHECK
Date Time Interrogation Session: 20220629230656
Implantable Pulse Generator Implant Date: 20201207

## 2020-12-11 NOTE — Progress Notes (Signed)
Carelink Summary Report / Loop Recorder 

## 2020-12-24 ENCOUNTER — Ambulatory Visit (INDEPENDENT_AMBULATORY_CARE_PROVIDER_SITE_OTHER): Payer: Medicare Other

## 2020-12-24 DIAGNOSIS — R55 Syncope and collapse: Secondary | ICD-10-CM | POA: Diagnosis not present

## 2020-12-25 LAB — CUP PACEART REMOTE DEVICE CHECK
Date Time Interrogation Session: 20220801230345
Implantable Pulse Generator Implant Date: 20201207

## 2021-01-16 NOTE — Progress Notes (Signed)
Carelink Summary Report / Loop Recorder 

## 2021-01-24 ENCOUNTER — Ambulatory Visit (INDEPENDENT_AMBULATORY_CARE_PROVIDER_SITE_OTHER): Payer: Medicare Other | Admitting: Cardiovascular Disease

## 2021-01-24 ENCOUNTER — Other Ambulatory Visit: Payer: Self-pay

## 2021-01-24 ENCOUNTER — Encounter: Payer: Self-pay | Admitting: Cardiovascular Disease

## 2021-01-24 DIAGNOSIS — I452 Bifascicular block: Secondary | ICD-10-CM

## 2021-01-24 DIAGNOSIS — I350 Nonrheumatic aortic (valve) stenosis: Secondary | ICD-10-CM | POA: Diagnosis not present

## 2021-01-24 DIAGNOSIS — I471 Supraventricular tachycardia, unspecified: Secondary | ICD-10-CM

## 2021-01-24 DIAGNOSIS — R931 Abnormal findings on diagnostic imaging of heart and coronary circulation: Secondary | ICD-10-CM

## 2021-01-24 DIAGNOSIS — I2510 Atherosclerotic heart disease of native coronary artery without angina pectoris: Secondary | ICD-10-CM | POA: Diagnosis not present

## 2021-01-24 DIAGNOSIS — E785 Hyperlipidemia, unspecified: Secondary | ICD-10-CM

## 2021-01-24 NOTE — Progress Notes (Signed)
Patient ID: Eugene [name], male   DOB: 04-26-1933, 87 y.o.   MRN: [id number]    Primary MD: Dr. Derinda Late  HPI: Eugene [name] is a 87 y.o. male who presents to the office today for a 6 month cardiology evaluation.  Eugene [name] has a remote history of sarcoidosis and had been followed at [facility] with complete remission. He has a history of mild hyperlipidemia and has been on Vytorin 10/40. He also has a history of BPH on Proscar.  An echo Doppler study which was done to evaluate a systolic murmur in the aortic region noted in December 2013 showed normal systolic function with mild focal basal hypertrophy of the septum. He had normal systolic function. There was evidence for a moderate diffuse calcification involving the noncoronary cusp of a trileaflet aortic valve. He did have sclerosis without stenosis. Valve area was 2.55 cm. RV size was upper normal to mildly dilated. A nuclear perfusion study done in 2013 was unchanged from 6 years previously and continued to show normal perfusion.  Laboratory done by Dr. Deforest Hoyles in August 2015 was normal with a hemoglobin of 14.5, hematocrit 43.8.  Renal function was excellent with a BUN of 19, Cr 0.99.  Fasting glucose was 78.  Lipid studies remained excellent with a total cholesterol 125, triglycerides 14, HDL cholesterol 44, and LDL cholesterol at 60.  Angiotensin converting enzyme was normal at 37 and last year was 33.  He underwent an echo Doppler study in 03/13/2015.  This revealed an ejection fraction at 60-65%. There was grade 1 diastolic dysfunction.  He had normal LV filling pressures.  His aortic valve was calcified and there was evidence for mild aortic stenosis with a mean gradient of 9, peak gradient of 18, and valve area of 1.87 cm and mild aortic insufficiency.  There was mild mitral annular calcification with trivial MR , mild LA dilatation, and moderate TR with PA pressure 30 mm. He underwent right knee replacement  surgery by Dr. Moshe Salisbury in November.  Postoperatively had some swelling and lower extremity venous studies were negative.  When I saw him in 2017, he was doing well from a cardiac standpoint.   Specifically, he denied any episodes of chest pain.  He was able to play tennis again following his knee surgery.  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  He had also undergone follow-up CT imaging asked and was found to have a new irregularly-shaped nodular density in the left lower lobe, alt to represent a possible focus of active infection/inflammation.  A follow-up CT was recommended.  After a course of antimicrobial therapy.  His CT also demonstrated a stable ectatic.  Borderline aneurysmal ascending thoracic aorta at 3.9 cm.  He had evidence for coronary calcification.   Laboratory in 2017 by his primary physician showed total cholesterol was 134, triglycerides 79, HDL 43, and LDL 75 on his current dose of ezetimibe 10 mg and simvastatin 40 mg.   When I saw him in March 2019 he was remaining active and was walking on elliptical machine at least 3-4 times per week and was playing tennis 2 times per week.  He denied any chest pain or shortness of breath.  He denies palpitations.   In 2019, he was found to have a pulmonary infiltrate for which she was evaluated by  Dr. Elsworth Soho.  He had undergone bronchoscopy with biopsy and ultimately underwent an evaluation at [facility] and this nodule had not significantly changed over the past 16 years.  I evaluated him in May 2020 and a telemedicine visit.  At that time he denied any chest pain, palpitations, presyncope or syncope.  He denied any PND orthopnea.  He had previously been followed by Dr. Ardeth Perfect but he had recently signed up for concierge medicine with Dr. Derinda Late.  During that evaluation I recommended he  undergo a follow-up echo Doppler study to reassess his aortic valve since in 2016 he was found to have mild aortic stenosis.  He underwent an echo Doppler study on December 21, 2018 which continues to show normal systolic function with EF at 60 to 65%, and moderate asymmetric left ventricular hypertrophy.  There was grade 2 diastolic dysfunction.  There was mild aortic stenosis with moderate calcification of the aortic valve.  He had a mean systolic gradient of 11 mm and his peak gradient was 20.8 mm.  Calculated aortic valve was 1.7 cm and was consistent with mild aortic stenosis.  On January 25, 2019 while biking at [place] he apparently had a syncopal spell.  He was found by bystanders.  Apparently he is amnesic to the event and when he woke up he was at [facility] in the emergency room.  It did not appear that he was aware when he had fallen since there was no apparent marks on his hands to break a fall.  A CT scan of the maxillofacial area demonstrated a comminuted displaced bilateral nasal bone fracture as well as fracture of the right zygomatic arch.  There was a frontal scalp hematoma.  He had extensive CT imaging of his chest abdomen and pelvis.  Of note, on his chest CT he was felt to have severe coronary artery atherosclerotic calcifications.  There was no evidence for acute traumatic injury within the chest abdomen or pelvis.  He had a 3.6 cm indeterminate liver mass, 4.3 cm indeterminate left renal lesion and had multiple bladder stones and bladder diverticula likely related to chronic outlet obstruction from an enlarged prostate and had nonobstructive bilateral nephrolithiasis.  Upon his return to [facility], he was evaluated by Eugene [name] in the [place] primary care.  He subsequently was evaluated by Eugene [name] in our office for cardiology evaluation during my absence.  With his memory absence, it is felt that he had a concussion.  When seen by Eugene [name],  she reviewed his extensive evaluation and recent echo which confirmed that he did not have significant aortic stenosis.  Because of his remote history of sarcoidosis, she scheduled him to undergo cardiac MRI imaging to assess for scar/infiltrative disease.  He was also scheduled to undergo 14-day Ziotelemetry monitor.    I saw him in follow-up of Eugene [name] on February 14, 2019.  At the time of his evaluation he had not yet received his Zio patch monitor.  I reviewed extensively his records from [facility] as well as the records of Eugene [name].  His MR scan was reviewed and did not show any definitive myocardial late gadolinium enhancement so there was no definitive evidence for prior myocardial infarction, infiltrative disease or myocarditis.  LVEF was approximately 60%.  His RV EF was reported at 35% however when reviewed by Eugene [name] she had felt in 20 function was somewhat better.  I scheduled him to undergo coronary CTA to assess potential CAD  disease with silent ischemia leading to his clinical event.  He also underwent carotid duplex imaging.Marland Kitchen  His 14-day ZIO monitor showed predominant sinus rhythm with right bundle branch block.  He did have an average heart rate at 62 bpm with a minimum of 42 while sleeping and maximal heart rate of 190.  He had several short bursts of SVT the fastest interval lasting 4 beats with a maximum rate of 190.  The longest SVT episode was 12.8 seconds with an average heart rate of 138 bpm.  There was no VT or atrial fibrillation or high degree block or pauses noted.  Carotid duplex imaging essentially normal with normal antegrade vertebral and subclavian flow and was without evidence for internal carotid stenoses.  I received an initial report of his CT angiogram which came back as normal.  We had notified him of the results.  Apparently, there was an addendum to the report port when it was reinterpreted adjusted multivessel CAD with a calcium score  of 1151 with moderate calcific plaque in the mid distal left main, severe calcific plaque in the mid LAD mild calcific plaque in the circumflex and RCA.  There was concern of possible significant blooming artifact which may have contributed to overestimate of LAD stenoses.  Subsequent FFR analysis was normal and did not reveal any hemodynamic significant stenoses.   I had a long conversation with Eugene [name] as well as called his daughter in [place] in follow-up of his April 08, 2019 office visit.  At that time I recommended initiation of aspirin 81 mg and initiated metoprolol succinate 12.5 mg daily.  I also discussed changing him to more aggressive lipid-lowering therapy and he recently was started on rosuvastatin 40 mg in place of simvastatin. When I last saw him in the office on April 18, 2019 I recommended definitive cardiac catheterization in light of his coronary CT angio findings also recommended insertion of a loop monitor for potential long-term monitoring of potential cardiac arrhythmia.   Eugene [name] underwent cardiac catheterization on May 02, 2019 by me. There was mild to moderate coronary calcification without high-grade obstructive disease. There was a smooth distal tapering of his left main with 30% narrowing, 30% proximal and 20% mid calcified stenoses in the LAD with 40% diffuse stenosis in a small caliber mid LAD vessel. The left circumflex vessel was angiographically normal and dominant. The RCA was a normal nondominant vessel. Of note, there was a small caliber ramus intermediate branch which appeared to fill directly into the left ventricle resulting in left ventricular opacification. He had only very mild aortic stenosis with a transvalvular aortic gradient at 10 to 12 mm. There was mild aortic valve calcification. There that day before going home he underwent insertion of a loop recorder by Dr. Sallyanne Kuster.  I saw him in follow-up of his catheterization and loop recorder on May 19, 2019.  Over the last several weeks, Eugene [name] has felt well. He denies any episodes of chest pain or shortness of breath. Loop recorder interrogation to date has not shown any events.   Since I saw him in December 2020, he has been undergoing monthly device checks of his loop recorder.  He has normal device function.  There are no new symptom episodes or episodes of tachycardia, bradycardia or pauses.  There were no new AF episodes.  He underwent a follow-up echo Doppler study on July 05, 2019.  This essentially is unchanged from his prior evaluation and continues to show hyperdynamic LV function.  He has grade 1 diastolic dysfunction.  There is decreased motion of his left and noncoronary cusp of a trileaflet aortic valve.  Aortic stenosis is mild with a mean gradient of 10.5 and peak gradient of 18.8.  There is moderate aortic insufficiency.  Ascending aorta is mildly dilated at 40 mm.  There is moderate tricuspid regurgitation.  I  saw him in February 2021 at which time he continued to feel well.  He specifically denied any chest pain, PND orthopnea, presyncope or syncope or awareness of palpitations.    He underwent recent laboratory now on his regimen of Zetia plus rosuvastatin 40 mg, and LDL cholesterol is now markedly reduced from 72 down to 30 with combination treatment.  There is minimally elevated AST and ALT levels of 45 and 5 respectively.  He remains asymptomatic.  I last saw him on February 10, 2020.  He had seen Dr. Derinda Late who is his primary provider andunderwent recent laboratory which I was able to obtain today after his office visit and called him to discuss the findings with him.  Laboratory from January 03, 2020 was excellent.  LDL cholesterol was 36.  He underwent particle assessment and LDL particle number was optimal at 905 as was small LDL particles at 138.  April lipoprotein B was excellent at 49 and LP(a) was excellent at 27.  He continues to be active and plays tennis  and does elliptical.  He also rides his bike.  He denies any anginal symptoms.  He continues to have monthly loop recorder checks.  His most recent evaluation did not reveal any events.   He was asymptomatic and continued to be on low-dose metoprolol succinate 12.5 mg daily with stable blood pressure.  I last saw him on August 15, 2020.  Since his prior evaluation he had a follow-up echo Doppler study on August 09, 2020.  This essentially was unchanged from previously and showed an EF of 60 to 65% with grade 1 diastolic dysfunction.  There was mild asymmetric LVH at of the basal septal segment.  There was moderate calcification of a trileaflet aortic valve with mild aortic stenosis.  His mean gradient was 10 mmHg with a peak gradient of 18.3.  He continued to be on Zetia and rosuvastatin 40 mg for hyperlipidemia and metoprolol succinate 12.5 mg. He denied any palpitations.  He continues to undergo remote loop recorder check at 19-month intervals.  He has not had any clinically significant events and has normal battery status.   Since I last saw him, he has remained stable.  He specifically denies any PE or syncope or awareness of palpitations.  He denies any chest pain or exertional dyspnea.  His resting pulse is in the 50s and he remains asymptomatic.  He had undergone laboratory by Dr. Derinda Late in December 2021.  Total cholesterol was 102, HDL 45, triglycerides 65, and LDL 43.  Renal function was stable with a creatinine of 1.1.  He continues to be active and rides a bike.  He will be going to [place] at the end of next week.  I reviewed his recent implantable loop recorder downloads.  He has not had any clinically significant events.  He presents for evaluation.  Past Medical History:  Diagnosis Date   Bladder stones    BPH (benign prostatic hyperplasia)    Hernia of abdominal cavity    History of kidney stones    Hyperlipidemia    Incomplete right bundle branch block    Low  O2 saturation  04/04/2015   Wife says O2 sats are in the low 90s base line.  Now in the 58s.  Will do incentive spirometry   Primary localized osteoarthritis of right knee 04/02/2015   Sarcoidosis    diagnosed 8-9 yrs ago   Upper GI bleed 04/04/2015   Patient had coffee ground emesis that was Guiac positive on Monday.  EGD scheduled for 04/04/2015. Started on IV Protonix    Past Surgical History:  Procedure Laterality Date   ANTERIOR CERVICAL DECOMP/DISCECTOMY FUSION  2007   BRONCHOSCOPY  2007   CARDIOVASCULAR STRESS TEST  04/29/2012   normal nuclear stress study.    CYSTOSCOPY  2004   DOPPLER ECHOCARDIOGRAPHY  04/29/2012   EF not noted. small perimembranous ventricular septal defect. small left to right ventricular shunt. moderate diffuse calcification involving noncoronary cusp of the aortic valve.    ESOPHAGOGASTRODUODENOSCOPY  04/04/2015   ESOPHAGOGASTRODUODENOSCOPY N/A 04/04/2015   Procedure: ESOPHAGOGASTRODUODENOSCOPY (EGD);  Surgeon: Manus Gunning, MD;  Location: [place];  Service: Gastroenterology;  Laterality: N/A;   INGUINAL HERNIA REPAIR Right 2009   LEFT HEART CATH AND CORONARY ANGIOGRAPHY N/A 05/02/2019   Procedure: LEFT HEART CATH AND CORONARY ANGIOGRAPHY;  Surgeon: Troy Sine, MD;  Location: [facility];  Service: Cardiovascular;  Laterality: N/A;   LOOP RECORDER INSERTION N/A 05/02/2019   Procedure: LOOP RECORDER INSERTION;  Surgeon: Sanda Klein, MD;  Location: [place] [facility];  Service: Cardiovascular;  Laterality: N/A;   TOTAL KNEE ARTHROPLASTY Right 04/02/2015   Procedure: TOTAL KNEE ARTHROPLASTY;  Surgeon: Elsie Saas, MD;  Location: [place];  Service: Orthopedics;  Laterality: Right;   VIDEO BRONCHOSCOPY Bilateral 09/23/2017   Procedure: VIDEO BRONCHOSCOPY WITH FLUORO;  Surgeon: Rigoberto Noel, MD;  Location: [facility];  Service: Cardiopulmonary;  Laterality: Bilateral;    No Known Allergies  Current Outpatient Medications  Medication Sig Dispense  Refill   aspirin EC 81 MG tablet Take 1 tablet (81 mg total) by mouth daily. 90 tablet 3   Cholecalciferol (VITAMIN D) 50 MCG (2000 UT) CAPS Take 2,000 Units by mouth daily.      ezetimibe (ZETIA) 10 MG tablet TAKE (1) TABLET DAILY AT BEDTIME. 90 tablet 3   finasteride (PROSCAR) 5 MG tablet Take 5 mg by mouth every evening.      metoprolol succinate (TOPROL-XL) 25 MG 24 hr tablet Take 0.5 tablets (12.5 mg total) by mouth daily. 45 tablet 2   potassium citrate (UROCIT-K) 10 MEQ (1080 MG) SR tablet Take 10 mEq by mouth daily.     rosuvastatin (CRESTOR) 40 MG tablet Take 40 mg by mouth daily.     No current facility-administered medications for this visit.    Social History   Socioeconomic History   Marital status: Married    Spouse name: Not on file   Number of children: Not on file   Years of education: Not on file   Highest education level: Not on file  Occupational History   Not on file  Tobacco Use   Smoking status: Former    Types: Pipe    Quit date: 05/26/1972    Years since quitting: 48.7   Smokeless tobacco: Never   Tobacco comments:    Quit 43 year  Substance and Sexual Activity   Alcohol use: Yes    Alcohol/week: 4.0 standard drinks    Types: 4 Standard drinks or equivalent per week   Drug use: No   Sexual activity: Not on file  Other  Topics Concern   Not on file  Social History Narrative   Not on file   Social Determinants of Health   Financial Resource Strain: Not on file  Food Insecurity: Not on file  Transportation Needs: Not on file  Physical Activity: Not on file  Stress: Not on file  Social Connections: Not on file  Intimate Partner Violence: Not on file   Socially he is married. He  9 grandchildren. One son lives in [place], a daughter lives in [place]. There is no tobacco use. He does take occasional alcohol. He does exercise.  Family History  Problem Relation Age of Onset   Ovarian cancer Mother    Heart attack Father 100    ROS General:  Negative; No fevers, chills, or night sweats;  HEENT: Negative; No changes in vision or hearing, sinus congestion, difficulty swallowing Pulmonary: Negative; No cough, wheezing, shortness of breath, hemoptysis Cardiovascular: See HPI GI: Negative; No nausea, vomiting, diarrhea, or abdominal pain GU: Negative; No dysuria, hematuria, or difficulty voiding Musculoskeletal: Recent bilateral nasal bone fractures and fracture of right zygomatic arch Hematologic/Oncology: Negative; no easy bruising, bleeding Remote history of sarcoidosis, completely resolved Endocrine: Negative; no heat/cold intolerance; no diabetes Neuro: Amnesic to his syncopal spell Skin: Negative; No rashes or skin lesions Psychiatric: Negative; No behavioral problems, depression Sleep: Negative; No snoring, daytime sleepiness, hypersomnolence, bruxism, restless legs, hypnogognic hallucinations, no cataplexy Other comprehensive 14 point system review is negative.   PE BP 118/60 (BP Location: Right Arm)   Pulse (!) 52   Ht $R'5\' 9"'wR$  (1.753 m)   Wt 191 lb 3.2 oz (86.7 kg)   SpO2 93%   BMI 28.24 kg/m    Repeat blood pressure by me was 120/64 supine and 120/60 standing  Wt Readings from Last 3 Encounters:  01/24/21 191 lb 3.2 oz (86.7 kg)  08/15/20 195 lb (88.5 kg)  02/10/20 191 lb 12.8 oz (87 kg)   General: Alert, oriented, no distress.  Skin: normal turgor, no rashes, warm and dry HEENT: Normocephalic, atraumatic. Pupils equal round and reactive to light; sclera anicteric; extraocular muscles intact;  Nose without nasal septal hypertrophy Mouth/Parynx benign; Mallinpatti scale2 Neck: No JVD, no carotid bruits; normal carotid upstroke Lungs: clear to ausculatation and percussion; no wheezing or rales Chest wall: without tenderness to palpitation Heart: PMI not displaced, RRR, s1 s2 normal, 1/6 systolic murmur, no diastolic murmur, no rubs, gallops, thrills, or heaves Abdomen: soft, nontender; no hepatosplenomehaly,  BS+; abdominal aorta nontender and not dilated by palpation. Back: no CVA tenderness Pulses 2+ Musculoskeletal: full range of motion, normal strength, no joint deformities Extremities: no clubbing cyanosis or edema, Homan's sign negative  Neurologic: grossly nonfocal; Cranial nerves grossly wnl Psychologic: Normal mood and affect  January 24, 2021 ECG (independently read by me): Sinus bradycardia at 52; RBBB with repolarization, LVH  August 15, 2020 ECG (independently read by me): Sinus bradycardia at 57, RBBB with repolarization changes  September 2021 ECG (independently read by me): Sinus bradycardia at 56, RBBB with repolarization, LVH by voltage; normal intervals, no ectopy  February 12, 20212 ECG (independently read by me): Sinus bradycardia 58 bpm, right bundle branch block with repolarization changes.  Borderline criteria for LVH.  No ectopy.  QTc interval 447 ms  December 2020 ECG (independently read by me): Sinus bradycardia at 54 bpm, right bundle branch block with repolarization changes.  Left axis deviation.  Borderline voltage criteria for LVH  April 08, 2019 ECG (independently read by me): Sinus rhythm at 65  bpm, left axis deviation, right bundle branch block with repolarization changes.  LVH by voltage.  QTc interval 453 ms.  PR interval 146 ms.  September 2020 ECG (independently read by me): Sinus bradycardia at 53 bpm, right bundle branch block with repolarization changes.  QRS duration 156 ms.  Mild LVH by voltage in aVL.  QTc interval 439 ms  March 2019 ECG (independently read by me): normal sinus rhythm at 65 bpm.  One isolated PVC.  Incomplete right bundle branch block.  Borderline LVH.  Normal intervals.  February 2018 ECG (independently read by me): Sinus bradycardia at 56 bpm.  Mild sinus arrhythmia.  Mild LVH.  Normal intervals.  No ST segment changes.  February 2017 ECG (independently read by me):  Normal sinus rhythm at 63 bpm.  Early transition.  No ST segment  changes.  December 2015 ECG (independently read by me): Normal sinus rhythm at 66 bpm.  Mild RV conduction delay.  Early transition.  December 2014 ECG: Sinus rhythm with an occasional PVC; normal intervals.  LABS: I personally reviewed the laboratory from [facility] done on 02/19/2016.  BUN 18, creatinine 0.9.  Ingrown 14.7, hematocrit 44.9.  Normal LFTs.  TSH 2.08.  April lipoprotein B 66.  Normal PSA.  Lipid studies as noted above.  Lpid studies from March 03, 2017: Total cholesterol 134, HDL 39, LDL 70, triglycerides 124.     After he left the office I was able to obtain accurate from Dr. Sandi Mariscal from his blood work on Eugene [name] from December 2021: Cholesterol 102, HDL 45, triglycerides 65, and LDL 43. Hypoprotein particle analysis small LDL particles excellent at 281. CMP normal. CBC normal. A1c 5.8. Vitamin D 26.3.  BMP Latest Ref Rng & Units 06/30/2019 04/28/2019 02/24/2019  Glucose 65 - 99 mg/dL 88 106(H) 88  BUN 8 - 27 mg/dL $Remove'16 21 21  'ReLYdyQ$ Creatinine 0.76 - 1.27 mg/dL 1.03 0.91 1.02  BUN/Creat Ratio 10 - $Re'24 16 23 21  'KQJ$ Sodium 134 - 144 mmol/L 138 139 139  Potassium 3.5 - 5.2 mmol/L 4.8 4.9 4.8  Chloride 96 - 106 mmol/L 103 102 102  CO2 20 - 29 mmol/L $RemoveB'23 27 25  'uIpOgWbN$ Calcium 8.6 - 10.2 mg/dL 10.0 10.0 10.3(H)   Hepatic Function Latest Ref Rng & Units 06/30/2019 03/20/2015 08/21/2014  Total Protein 6.0 - 8.5 g/dL 6.6 6.6 7.0  Albumin 3.6 - 4.6 g/dL 4.3 4.0 4.1  AST 0 - 40 IU/L 45(H) 29 32  ALT 0 - 44 IU/L 54(H) 30 32  Alk Phosphatase 39 - 117 IU/L 73 68 73  Total Bilirubin 0.0 - 1.2 mg/dL 0.7 0.8 0.7   CBC Latest Ref Rng & Units 06/30/2019 04/28/2019 01/26/2019  WBC 3.4 - 10.8 x10E3/uL 6.7 9.5 10.8(H)  Hemoglobin 13.0 - 17.7 g/dL 14.1 14.5 14.3  Hematocrit 37.5 - 51.0 % 43.6 44.1 43.9  Platelets 150 - 450 x10E3/uL 146(L) 187 176   Lab Results  Component Value Date   MCV 92 06/30/2019   MCV 91 04/28/2019   MCV 92.8 01/26/2019   Additional studies reviewed: I  reviewed all the records from [facility] including his CT images of January 25, 2019 following his recent syncopal spell induced  Trauma.  ECHO 7//28/2020 IMPRESSIONS  1. The left ventricle has normal systolic function with an ejection fraction of 60-65%. The cavity size was normal. There is moderate asymmetric left ventricular hypertrophy. Left ventricular diastolic Doppler parameters are consistent with  pseudonormalization.  2. The right  ventricle has normal systolic function. The cavity was normal. There is no increase in right ventricular wall thickness. Right ventricular systolic pressure could not be assessed.  3. Left atrial size was mildly dilated.  4. The aortic valve is abnormal. Moderate calcification of the aortic valve. Aortic valve regurgitation is trivial by color flow Doppler. Mild stenosis with a mean systolic gradient of 11 mmHg of the aortic valve.  5. The aorta is normal in size and structure when indexed to body surface area (ascending aorta measures 38 mm).  CARDIAC MRI 02/11/2019 IMPRESSION: 1. Normal left ventricular size with mild LV hypertrophy. This appears concentric and not asymmetric. EF 60%, normal wall motion.   2. The RV is mildly dilated with mild to moderate hypokinesis, EF 35%.   3. There is no definite myocardial LGE, so no definitive evidence for prior MI, infiltrative disease, or myocarditis.   Abnormal right ventricle. However, there is no definitive evidence for cardiac sarcoidosis or hypertrophic cardiomyoapthy.  ----------------------------------------------------------------------------------------------------------- CTA CORONARY :  IMPRESSION: 03/07/2019  1. Coronary calcium score of 0. This was 0 percentile for age and sex matched control.   2. Normal coronary origin with right dominance.   3. No evidence of CAD.   Fransico Him  ADDENDUM/ Edited Result :  CTA  Aorta: Normal size. Calcifications of the aortic root and  ascending aorta. No dissection.   Aortic Valve:  Trileaflet.  Moderate calcifications.   Coronary Arteries:  Normal coronary origin.  Left dominance.   RCA is a small non dominant artery that gives rise to PDA and PLVB. There is mild calcified plaque in the proximal RCA with associated stenosis of 25-49%. There is motion artifact in the mid and distal RCA.   Left main is a large artery that gives rise to LAD and LCX arteries. There is moderate calcified plaque in the mid to distal LM with associated 50-69% stenosis.   LAD is a large vessel that gives rise to a large D1 and moderate sized D2. There is moderate calcified plaque in the proximal LAD with associated 50-69% stenosis. There is severe calcified plaque in the mid LAD with associated stenosis of 70-99%.   LCX is a large dominant artery that gives rise to one large OM1 branch. There is minimal calcified plaque in the proximal to mid LCx with associated stenosis of 0-24%. There is mild calcified plaque in the mid LCx at the takeoff of a moderate sized branching OM with associated stenosis of 25-49%. There is minimal atherosclerosis of the distal LCx with associated stenosis of 0 - 24%.   Other findings:   Normal pulmonary vein drainage into the left atrium.   Normal let atrial appendage without a thrombus.   Normal size of the pulmonary artery.   IMPRESSION: 1. Coronary calcium score of 1151. This was 66th percentile for age and sex matched control.   2.  Normal coronary origin with left dominance.   3.  Moderate to severe calcified AV cusps.   4.  Atherosclerosis of the aortic root and ascending aorta.   5. Moderate Calcified plaque in the distal LM and severe calcified plaque in the mid LAD. CAD-RADS 4b. Significant blooming may over estimate LAD stenosis.   6.  Recommend aggressive risk factor modification.   7.  Recommend cardiac catheterization.   8.  Study has been sent for [facility] analysis.   Traci  Turner  -----------------------------------------------------------------------------------------  FFR FINDINGS: FFRCT analysis was performed on the original cardiac CT angiogram dataset. Diagrammatic representation  of the FFRct analysis is provided in a separate PDF document in PACS. This dictation was created using the PDF document and an interactive 3D model of the results. 3D model is not available in the EMR/PACS. Normal FFR range is >0.80.   1. No significant flow limiting lesion: LM FFR = 0.99.   2. LAD: No significant flow limiting lesion: Proximal FFR = 0.98, Mid FFR = 0.96, Distal FFR = 0.89.   3. LCX: No significant flow limiting lesion: Proximal FFR = 0.97, Mid FFR = 0.94, Distal FFR = 0.91.   4. RCA: No flow limiting lesion in non dominant small RCA: Proximal FFR = 1.00, Mid FFR = 0.97, distal FFR not obtained.   IMPRESSION: 1. CT FFR analysis show no hemodynamically significant flow limiting lesions.   ------------------------------------------------------------------------------------------- CARDIAC CATH 05/02/2019 Mid LM to Dist LM lesion is 30% stenosed. Prox LAD lesion is 30% stenosed. Mid LAD lesion is 20% stenosed. Dist LAD lesion is 40% stenosed.   There is evidence for mild to moderate coronary calcification without high-grade obstructive disease.  There is smooth distal tapering of the left main with 30% narrowing; 30% proximal and 20% mid ossified stenoses in the LAD with 40% diffuse stenosis in a small caliber mid LAD vessel.  The left circumflex vessel is an angiographically normal dominant vessel; the RCA is normal nondominant vessel.  There is a small caliber ramus intermediate branch which appears to fill directly into the left ventricle resulting in ventricular opacification,   Mild transvalvular aortic gradient at 10 to 49mmHg going from the left ventricle to the aorta consistent with his known very mild aortic valve stenosis.  There is mild aortic  valve calcification.   RECOMMENDATION:  The patient will continue on aspirin therapy as well as his low-dose beta-blocker therapy.  A loop recorder will be placed in the short stay following his catheterization with careful close monitoring of his rhythm status.  Continue aggressive lipid-lowering therapy with target LDL ideally in the 50s or below.  The patient was recently switched from simvastatin to rosuvastatin 40 mg.       LOOP RECORDER DEVICE CHECK: 06/08/2019 [facility] summary report received. Battery status OK. Normal device function. No new symptom episodes, tachy episodes, brady, or pause episodes. No new AF episodes. Monthly summary reports and ROV/PRN  ECHO 07/05/2019 IMPRESSIONS   1. Left ventricular ejection fraction, by estimation, is 65 to 70%. The  left ventricle has hyperdynamic function. The left ventrical has no  regional wall motion abnormalities. Left ventricular diastolic parameters  are consistent with Grade I diastolic  dysfunction (impaired relaxation).   2. Right ventricular systolic function is normal. The right ventricular  size is mildly enlarged. There is moderately elevated pulmonary artery  systolic pressure. The estimated right ventricular systolic pressure is  65.0 mmHg.   3. No evidence of mitral valve regurgitation.   4. Tricuspid valve regurgitation is moderate.   5. Decreased motion of left and non coronary cusp. The aortic valve is  tricuspid. Aortic valve regurgitation is moderate. Mild aortic valve  stenosis. Aortic valve area, by VTI measures 1.78 cm. Aortic valve mean  gradient measures 10.5 mmHg. Aortic  valve Vmax measures 2.17 m/s.   6. Aortic dilatation noted. 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   7. The inferior vena cava is normal in size with greater than 50%  respiratory variability, suggesting right atrial pressure of 3 mmHg.   Comparison(s): No significant change from prior  study. Prior images   reviewed side by side.    ECHO 08/09/2020: IMPRESSIONS   1. Left ventricular ejection fraction, by estimation, is 60 to 65%. The  left ventricle has normal function. The left ventricle has no regional  wall motion abnormalities. There is mild asymmetric left ventricular  hypertrophy of the basal-septal segment.  Left ventricular diastolic parameters are consistent with Grade I  diastolic dysfunction (impaired relaxation).   2. Right ventricular systolic function is normal. The right ventricular  size is mildly enlarged.   3. The mitral valve is grossly normal. No evidence of mitral valve  regurgitation. No evidence of mitral stenosis.   4. The aortic valve is tricuspid. There is moderate calcification of the  aortic valve. There is moderate thickening of the aortic valve. Aortic  valve regurgitation is mild. Mild aortic valve stenosis. Aortic valve  area, by VTI measures 3.07 cm. Aortic  valve mean gradient measures 10.0 mmHg. Aortic valve Vmax measures 2.14  m/s.   Comparison(s): No significant change from prior study. LV function  unchanged. Aortic stenosis remains mild. Mild AI on this study.    IMPRESSION:  1. CAD in native artery   2. High coronary artery calcium score   3. Mild aortic stenosis   4. Paroxysmal SVT (supraventricular tachycardia) ([place])   5. RBBB (right bundle branch block)   6. Hyperlipidemia with target LDL less than 70     ASSESSMENT AND PLAN: Eugene [name] is an active 40 -year-old gentleman who has a remote history of prior sarcoid disease with ultimate complete remisson.  He has a history of mild hyperlipidemia which has been aggressively controlled.  Remotely, he was found to have mild coronary calcification on CT imaging.  I have been aggressive with lipid therapy in attempt to potentially induce plaque regression and previously he has been on Zetia/simvastatin 10/40 with LDL cholesterols previously documented to be 70.  In 2016 he was found to have aortic  stenosis which was mild with mild aortic insufficiency with a mean gradient of 9 and peak gradient of 18.  Around Labor Day in 2020 while [place] the beach [place] on a hot day he had a syncopal spell.  He has no recollection of falling and when he awakened he was in [facility] emergency room.  It does not appear that he had any significant awareness of rhythm disturbance and apparently when EMS arrived he must have had 6 stable cardiac rhythm.  In July 2020  a follow-up echo Doppler study confirmed just mild aortic stenosis with a valve area of 1.7 cm.  It is unlikely that the severity of his aortic stenosis accounts for his syncope.  Recently, his ECG showed definite right bundle branch block which in the past had just been incomplete right bundle.  On his ECG of February 14, 2019 he was in sinus rhythm, although bradycardic at 53 with right bundle branch block.  QTc interval was normal.  I was concerned potential ischemic etiology scheduled him to undergo a coronary CTA for further evaluation of potential significant coronary obstructive disease. I  also scheduling him for carotid duplex imaging to assess his carotid arteries and vertebral flow.  His Zio patch findings demonstrated predominant sinus rhythm but revealed several episodes of supraventricular tachycardia, as well as episodes of isolated ectopic in isolated ventricular ectopy.  His fastest heartbeat was 190 bpm which lasted 4 beats and was consistent with SVT.  The longest SVT episode was 12.8 seconds with an average  rate of 138 bpm. 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  Definitive cardiac catheterization demonstrated mild to moderate coronary calcification with nonobstructive plaque in his left main and LAD of approximately 30%. He continues to remain asymptomatic and is on a more aggressive treatment to attempt plaque regression rosuvastatin 40 mg and Zetia 10 mg.  Lipid studies in  February 2021 showed an LDL of 30.  Most recent laboratory by Dr. Sandi Mariscal in December 2021 continue to show excellent labs with LDL cholesterol at 43, triglycerides 65 HDL 45 and total cholesterol 102.  LDL particle number was low at 732.  I have continued to be aggressive with lipid-lowering therapy and he is tolerating Zetia 10 mg and rosuvastatin 40 mg combination.  I reviewed his loop recorder assessments which have not detected any significant abnormality.  His most recent echo Doppler study in March 2022 essentially was unchanged from his prior evaluation and continues to show an EF of 60 to 83%, grade 1 diastolic dysfunction, mild asymmetric LVH at the basal septum and moderate calcification of a trileaflet aortic valve with mild aortic stenosis (mean gradient 10 and peak gradient 18 mmHg).  Prior to his trip to [place], I have suggested repeat laboratory.  I will see him in March/April 2023 for follow-up evaluation.   Troy Sine, MD, [facility]  01/26/2021 4:13 PM

## 2021-01-24 NOTE — Patient Instructions (Addendum)
Medication Instructions:  The current medical regimen is effective;  continue present plan and medications.  *If you need a refill on your cardiac medications before your next appointment, please call your pharmacy*   Lab Work: Fasting lab work (CBC, CMET, TSH, LIPID)   If you have labs (blood work) drawn today and your tests are completely normal, you will receive your results only by: [place] (if you have MyChart) OR A paper copy in the mail If you have any lab test that is abnormal or we need to change your treatment, we will call you to review the results.    Follow-Up: At [facility], you and your health needs are our priority.  As part of our continuing mission to provide you with exceptional heart care, we have created designated Provider Care Teams.  These Care Teams include your primary Cardiologist (physician) and Advanced Practice Providers (APPs -  Physician Assistants and Nurse Practitioners) who all work together to provide you with the care you need, when you need it.  We recommend signing up for the patient portal called "MyChart".  Sign up information is provided on this After Visit Summary.  MyChart is used to connect with patients for Virtual Visits (Telemedicine).  Patients are able to view lab/test results, encounter notes, upcoming appointments, etc.  Non-urgent messages can be sent to your provider as well.   To learn more about what you can do with MyChart, go to [email or web address].    Your next appointment:   6 month(s)  The format for your next appointment:   In Person  Provider:   Shelva Majestic, MD

## 2021-01-26 ENCOUNTER — Encounter: Payer: Self-pay | Admitting: Cardiovascular Disease

## 2021-01-29 ENCOUNTER — Ambulatory Visit (INDEPENDENT_AMBULATORY_CARE_PROVIDER_SITE_OTHER): Payer: Medicare Other

## 2021-01-29 DIAGNOSIS — R55 Syncope and collapse: Secondary | ICD-10-CM | POA: Diagnosis not present

## 2021-01-29 LAB — CUP PACEART REMOTE DEVICE CHECK
Date Time Interrogation Session: 20220903230637
Implantable Pulse Generator Implant Date: 20201207

## 2021-02-07 NOTE — Progress Notes (Signed)
Carelink Summary Report / Loop Recorder 

## 2021-02-15 LAB — COMPREHENSIVE METABOLIC PANEL
ALT: 44 IU/L (ref 0–44)
AST: 33 IU/L (ref 0–40)
Albumin/Globulin Ratio: 1.9 (ref 1.2–2.2)
Albumin: 4.3 g/dL (ref 3.6–4.6)
Alkaline Phosphatase: 77 IU/L (ref 44–121)
BUN/Creatinine Ratio: 19 (ref 10–24)
BUN: 19 mg/dL (ref 8–27)
Bilirubin Total: 0.8 mg/dL (ref 0.0–1.2)
CO2: 24 mmol/L (ref 20–29)
Calcium: 10.1 mg/dL (ref 8.6–10.2)
Chloride: 101 mmol/L (ref 96–106)
Creatinine, Ser: 0.98 mg/dL (ref 0.76–1.27)
Globulin, Total: 2.3 g/dL (ref 1.5–4.5)
Glucose: 83 mg/dL (ref 65–99)
Potassium: 5 mmol/L (ref 3.5–5.2)
Sodium: 139 mmol/L (ref 134–144)
Total Protein: 6.6 g/dL (ref 6.0–8.5)
eGFR: 75 mL/min/{1.73_m2} (ref >59)

## 2021-02-15 LAB — CBC
Hematocrit: 43.4 % (ref 37.5–51.0)
Hemoglobin: 14.3 g/dL (ref 13.0–17.7)
MCH: 30.2 pg (ref 26.6–33.0)
MCHC: 32.9 g/dL (ref 31.5–35.7)
MCV: 92 fL (ref 79–97)
Platelets: 149 10*3/uL — ABNORMAL LOW (ref 150–450)
RBC: 4.74 x10E6/uL (ref 4.14–5.80)
RDW: 13.2 % (ref 11.6–15.4)
WBC: 8.2 10*3/uL (ref 3.4–10.8)

## 2021-02-15 LAB — LIPID PANEL
Chol/HDL Ratio: 2.1 ratio (ref 0.0–5.0)
Cholesterol, Total: 101 mg/dL (ref 100–199)
HDL: 47 mg/dL (ref >39)
LDL Chol Calc (NIH): 38 mg/dL (ref 0–99)
Triglycerides: 77 mg/dL (ref 0–149)
VLDL Cholesterol Cal: 16 mg/dL (ref 5–40)

## 2021-02-15 LAB — TSH: TSH: 2.79 u[IU]/mL (ref 0.450–4.500)

## 2021-02-18 ENCOUNTER — Telehealth: Payer: Self-pay | Admitting: Cardiovascular Disease

## 2021-02-18 NOTE — Telephone Encounter (Signed)
Left detailed message on patient's voice mail that Dr. Claiborne Billings is working in the hospital this week and that he will review the lab work when he is able. Advised that I have forwarded the results to his PCP per his request.

## 2021-02-18 NOTE — Telephone Encounter (Signed)
  Per MyChart appointment request pool:  1) Can you ask Dr. Claiborne Billings for a quick note on his view of my Blood Test results?  2) Can you share the Blood test results with my family doctor Dr. Derinda Late ?

## 2021-03-04 ENCOUNTER — Ambulatory Visit (INDEPENDENT_AMBULATORY_CARE_PROVIDER_SITE_OTHER): Payer: Medicare Other

## 2021-03-04 DIAGNOSIS — R55 Syncope and collapse: Secondary | ICD-10-CM | POA: Diagnosis not present

## 2021-03-07 LAB — CUP PACEART REMOTE DEVICE CHECK
Date Time Interrogation Session: 20221013081559
Implantable Pulse Generator Implant Date: 20201207

## 2021-03-13 NOTE — Progress Notes (Signed)
Carelink Summary Report / Loop Recorder 

## 2021-04-02 LAB — CUP PACEART REMOTE DEVICE CHECK
Date Time Interrogation Session: 20221108230330
Implantable Pulse Generator Implant Date: 20201207

## 2021-04-08 ENCOUNTER — Ambulatory Visit (INDEPENDENT_AMBULATORY_CARE_PROVIDER_SITE_OTHER): Payer: Medicare Other

## 2021-04-08 DIAGNOSIS — R55 Syncope and collapse: Secondary | ICD-10-CM | POA: Diagnosis not present

## 2021-04-16 NOTE — Progress Notes (Signed)
Carelink Summary Report / Loop Recorder 

## 2021-05-13 ENCOUNTER — Ambulatory Visit (INDEPENDENT_AMBULATORY_CARE_PROVIDER_SITE_OTHER): Payer: Medicare Other

## 2021-05-13 DIAGNOSIS — R55 Syncope and collapse: Secondary | ICD-10-CM | POA: Diagnosis not present

## 2021-05-13 LAB — CUP PACEART REMOTE DEVICE CHECK
Date Time Interrogation Session: 20221218231012
Implantable Pulse Generator Implant Date: 20201207

## 2021-05-22 NOTE — Progress Notes (Signed)
Carelink Summary Report / Loop Recorder 

## 2021-05-31 ENCOUNTER — Telehealth: Payer: Self-pay | Admitting: Cardiovascular Disease

## 2021-05-31 MED ORDER — METOPROLOL SUCCINATE ER 25 MG PO TB24: 12.5000 mg | ORAL_TABLET | Freq: Every day | ORAL | 3 refills | Status: DC

## 2021-05-31 NOTE — Telephone Encounter (Signed)
Refill sent to pharmacy.   

## 2021-05-31 NOTE — Telephone Encounter (Signed)
°*  STAT* If patient is at the pharmacy, call can be transferred to refill team.   1. Which medications need to be refilled? (please list name of each medication and dose if known) metoprolol succinate (TOPROL-XL) 25 MG 24 hr tablet   2. Which pharmacy/location (including street and city if local pharmacy) is medication to be sent to? [place], [place] Phone:  [phone]  Fax:  [phone]      3. Do they need a 30 day or 90 day supply? 90 ds

## 2021-06-17 ENCOUNTER — Ambulatory Visit (INDEPENDENT_AMBULATORY_CARE_PROVIDER_SITE_OTHER): Payer: Medicare Other

## 2021-06-17 DIAGNOSIS — R55 Syncope and collapse: Secondary | ICD-10-CM

## 2021-06-17 LAB — CUP PACEART REMOTE DEVICE CHECK
Date Time Interrogation Session: 20230122231014
Implantable Pulse Generator Implant Date: 20201207

## 2021-06-28 NOTE — Progress Notes (Signed)
Carelink Summary Report / Loop Recorder 

## 2021-07-21 LAB — CUP PACEART REMOTE DEVICE CHECK
Date Time Interrogation Session: 20230224230819
Implantable Pulse Generator Implant Date: 20201207

## 2021-07-22 ENCOUNTER — Ambulatory Visit (INDEPENDENT_AMBULATORY_CARE_PROVIDER_SITE_OTHER): Payer: Medicare Other

## 2021-07-22 DIAGNOSIS — R55 Syncope and collapse: Secondary | ICD-10-CM | POA: Diagnosis not present

## 2021-07-29 NOTE — Progress Notes (Signed)
Carelink Summary Report / Loop Recorder 

## 2021-08-08 ENCOUNTER — Ambulatory Visit: Payer: Medicare Other | Admitting: Cardiovascular Disease

## 2021-08-08 ENCOUNTER — Other Ambulatory Visit: Payer: Self-pay

## 2021-08-19 ENCOUNTER — Ambulatory Visit: Payer: Medicare Other | Admitting: Cardiovascular Disease

## 2021-08-26 ENCOUNTER — Ambulatory Visit (INDEPENDENT_AMBULATORY_CARE_PROVIDER_SITE_OTHER): Payer: Medicare Other

## 2021-08-26 DIAGNOSIS — R55 Syncope and collapse: Secondary | ICD-10-CM

## 2021-08-27 LAB — CUP PACEART REMOTE DEVICE CHECK
Date Time Interrogation Session: 20230331230723
Implantable Pulse Generator Implant Date: 20201207

## 2021-09-09 NOTE — Progress Notes (Signed)
Carelink Summary Report / Loop Recorder 

## 2021-09-26 LAB — CUP PACEART REMOTE DEVICE CHECK
Date Time Interrogation Session: 20230503231523
Implantable Pulse Generator Implant Date: 20201207

## 2021-09-30 ENCOUNTER — Ambulatory Visit (INDEPENDENT_AMBULATORY_CARE_PROVIDER_SITE_OTHER): Payer: Medicare Other

## 2021-09-30 DIAGNOSIS — R55 Syncope and collapse: Secondary | ICD-10-CM | POA: Diagnosis not present

## 2021-10-18 NOTE — Progress Notes (Signed)
Carelink Summary Report / Loop Recorder 

## 2021-11-04 ENCOUNTER — Ambulatory Visit (INDEPENDENT_AMBULATORY_CARE_PROVIDER_SITE_OTHER): Payer: Medicare Other

## 2021-11-04 DIAGNOSIS — R55 Syncope and collapse: Secondary | ICD-10-CM

## 2021-11-05 LAB — CUP PACEART REMOTE DEVICE CHECK
Date Time Interrogation Session: 20230605231018
Implantable Pulse Generator Implant Date: 20201207

## 2021-11-19 ENCOUNTER — Encounter: Payer: Self-pay | Admitting: Cardiovascular Disease

## 2021-11-19 ENCOUNTER — Ambulatory Visit (INDEPENDENT_AMBULATORY_CARE_PROVIDER_SITE_OTHER): Payer: Medicare Other | Admitting: Cardiovascular Disease

## 2021-11-19 VITALS — BP 120/62 | HR 54 | Ht 69.0 in | Wt 194.4 lb

## 2021-11-19 DIAGNOSIS — I2510 Atherosclerotic heart disease of native coronary artery without angina pectoris: Secondary | ICD-10-CM

## 2021-11-19 DIAGNOSIS — I452 Bifascicular block: Secondary | ICD-10-CM

## 2021-11-19 DIAGNOSIS — I471 Supraventricular tachycardia, unspecified: Secondary | ICD-10-CM

## 2021-11-19 DIAGNOSIS — R931 Abnormal findings on diagnostic imaging of heart and coronary circulation: Secondary | ICD-10-CM | POA: Diagnosis not present

## 2021-11-19 DIAGNOSIS — I350 Nonrheumatic aortic (valve) stenosis: Secondary | ICD-10-CM

## 2021-11-19 DIAGNOSIS — E785 Hyperlipidemia, unspecified: Secondary | ICD-10-CM

## 2021-11-19 MED ORDER — METOPROLOL SUCCINATE ER 25 MG PO TB24: 12.5000 mg | ORAL_TABLET | ORAL | 3 refills | Status: DC

## 2021-11-19 NOTE — Progress Notes (Signed)
Patient ID: Eugene [name], male   DOB: January 11, 1933, 88 y.o.   MRN: [id number]    Primary MD: Dr. Derinda [name]  HPI: Eugene [name] is a 88 y.o. male who presents to the office today for a 9 month cardiology evaluation.  [name] has a remote history of sarcoidosis and had been followed at [facility] with complete remission. He has a history of mild hyperlipidemia and has been on Vytorin 10/40. He also has a history of BPH on Proscar.  An echo Doppler study which was done to evaluate a systolic murmur in the aortic region noted in December 2013 showed normal systolic function with mild focal basal hypertrophy of the septum. He had normal systolic function. There was evidence for a moderate diffuse calcification involving the noncoronary cusp of a trileaflet aortic valve. He did have sclerosis without stenosis. Valve area was 2.55 cm. RV size was upper normal to mildly dilated. A nuclear perfusion study done in 2013 was unchanged from 6 years previously and continued to show normal perfusion.  Laboratory done by Eugene [name] in August 2015 was normal with a hemoglobin of 14.5, hematocrit 43.8.  Renal function was excellent with a BUN of 19, Cr 0.99.  Fasting glucose was 78.  Lipid studies remained excellent with a total cholesterol 125, triglycerides 14, HDL cholesterol 44, and LDL cholesterol at 60.  Angiotensin converting enzyme was normal at 37 and last year was 33.  He underwent an echo Doppler study in 03/13/2015.  This revealed an ejection fraction at 60-65%. There was grade 1 diastolic dysfunction.  He had normal LV filling pressures.  His aortic valve was calcified and there was evidence for mild aortic stenosis with a mean gradient of 9, peak gradient of 18, and valve area of 1.87 cm and mild aortic insufficiency.  There was mild mitral annular calcification with trivial MR , mild LA dilatation, and moderate TR with PA pressure 30 mm. He underwent right knee replacement  surgery by Eugene [name] in November.  Postoperatively had some swelling and lower extremity venous studies were negative.  When I saw him in 2017, he was doing well from a cardiac standpoint.   Specifically, he denied any episodes of chest pain.  He was able to play tennis again following his knee surgery.  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  He had also undergone follow-up CT imaging asked and was found to have a new irregularly-shaped nodular density in the left lower lobe, alt to represent a possible focus of active infection/inflammation.  A follow-up CT was recommended.  After a course of antimicrobial therapy.  His CT also demonstrated a stable ectatic.  Borderline aneurysmal ascending thoracic aorta at 3.9 cm.  He had evidence for coronary calcification.   Laboratory in 2017 by his primary physician showed total cholesterol was 134, triglycerides 79, HDL 43, and LDL 75 on his current dose of ezetimibe 10 mg and simvastatin 40 mg.   When I saw him in March 2019 he was remaining active and was walking on elliptical machine at least 3-4 times per week and was playing tennis 2 times per week.  He denied any chest pain or shortness of breath.  He denies palpitations.   In 2019, he was found to have a pulmonary infiltrate for which she was evaluated by  Eugene [name].  He had undergone bronchoscopy with biopsy and ultimately underwent an evaluation at [facility] and this nodule had not significantly changed over the past 16 years.  I evaluated him in May 2020 and a telemedicine visit.  At that time he denied any chest pain, palpitations, presyncope or syncope.  He denied any PND orthopnea.  He had previously been followed by Dr. Ardeth [name] but he had recently signed up for concierge medicine with Dr. Derinda [name].  During that evaluation I recommended he  undergo a follow-up echo Doppler study to reassess his aortic valve since in 2016 he was found to have mild aortic stenosis.  He underwent an echo Doppler study on December 21, 2018 which continues to show normal systolic function with EF at 60 to 65%, and moderate asymmetric left ventricular hypertrophy.  There was grade 2 diastolic dysfunction.  There was mild aortic stenosis with moderate calcification of the aortic valve.  He had a mean systolic gradient of 11 mm and his peak gradient was 20.8 mm.  Calculated aortic valve was 1.7 cm and was consistent with mild aortic stenosis.  On January 25, 2019 while biking at [place] he apparently had a syncopal spell.  He was found by bystanders.  Apparently he is amnesic to the event and when he woke up he was at [facility] in the emergency room.  It did not appear that he was aware when he had fallen since there was no apparent marks on his hands to break a fall.  A CT scan of the maxillofacial area demonstrated a comminuted displaced bilateral nasal bone fracture as well as fracture of the right zygomatic arch.  There was a frontal scalp hematoma.  He had extensive CT imaging of his chest abdomen and pelvis.  Of note, on his chest CT he was felt to have severe coronary artery atherosclerotic calcifications.  There was no evidence for acute traumatic injury within the chest abdomen or pelvis.  He had a 3.6 cm indeterminate liver mass, 4.3 cm indeterminate left renal lesion and had multiple bladder stones and bladder diverticula likely related to chronic outlet obstruction from an enlarged prostate and had nonobstructive bilateral nephrolithiasis.  Upon his return to [facility], he was evaluated by Eugene [name] in the [place] primary care.  He subsequently was evaluated by Dr. Al [name] in our office for cardiology evaluation during my absence.  With his memory absence, it is felt that he had a concussion.  When seen by Dr. Harrell [name],  she reviewed his extensive evaluation and recent echo which confirmed that he did not have significant aortic stenosis.  Because of his remote history of sarcoidosis, she scheduled him to undergo cardiac MRI imaging to assess for scar/infiltrative disease.  He was also scheduled to undergo 14-day Ziotelemetry monitor.    I saw him in follow-up of Dr. Harrell [name] on February 14, 2019.  At the time of his evaluation he had not yet received his Zio patch monitor.  I reviewed extensively his records from [facility] as well as the records of Dr. Harrell [name].  His MR scan was reviewed and did not show any definitive myocardial [name] gadolinium enhancement so there was no definitive evidence for prior myocardial infarction, infiltrative disease or myocarditis.  LVEF was approximately 60%.  His RV EF was reported at 35% however when reviewed by Dr. Harrell [name] she had felt in 20 function was somewhat better.  I scheduled him to undergo coronary CTA to assess potential CAD  disease with silent ischemia leading to his clinical event.  He also underwent carotid duplex imaging.Marland Kitchen  His 14-day ZIO monitor showed predominant sinus rhythm with right bundle branch block.  He did have an average heart rate at 62 bpm with a minimum of 42 while sleeping and maximal heart rate of 190.  He had several short bursts of SVT the fastest interval lasting 4 beats with a maximum rate of 190.  The longest SVT episode was 12.8 seconds with an average heart rate of 138 bpm.  There was no VT or atrial fibrillation or high degree block or pauses noted.  Carotid duplex imaging essentially normal with normal antegrade vertebral and subclavian flow and was without evidence for internal carotid stenoses.  I received an initial report of his CT angiogram which came back as normal.  We had notified him of the results.  Apparently, there was an addendum to the report port when it was reinterpreted adjusted multivessel CAD with a calcium score  of 1151 with moderate calcific plaque in the mid distal left main, severe calcific plaque in the mid LAD mild calcific plaque in the circumflex and RCA.  There was concern of possible significant blooming artifact which may have contributed to overestimate of LAD stenoses.  Subsequent FFR analysis was normal and did not reveal any hemodynamic significant stenoses.   I had a long conversation with Mr. Herrington as well as called his daughter in [place] in follow-up of his April 08, 2019 office visit.  At that time I recommended initiation of aspirin 81 mg and initiated metoprolol succinate 12.5 mg daily.  I also discussed changing him to more aggressive lipid-lowering therapy and he recently was started on rosuvastatin 40 mg in place of simvastatin. When I last saw him in the office on April 18, 2019 I recommended definitive cardiac catheterization in light of his coronary CT angio findings also recommended insertion of a loop monitor for potential long-term monitoring of potential cardiac arrhythmia.   Mr. Vinal underwent cardiac catheterization on May 02, 2019 by me. There was mild to moderate coronary calcification without high-grade obstructive disease. There was a smooth distal tapering of his left main with 30% narrowing, 30% proximal and 20% mid calcified stenoses in the LAD with 40% diffuse stenosis in a small caliber mid LAD vessel. The left circumflex vessel was angiographically normal and dominant. The RCA was a normal nondominant vessel. Of note, there was a small caliber ramus intermediate branch which appeared to fill directly into the left ventricle resulting in left ventricular opacification. He had only very mild aortic stenosis with a transvalvular aortic gradient at 10 to 12 mm. There was mild aortic valve calcification. There that day before going home he underwent insertion of a loop recorder by Dr. Sallyanne Kuster.  I saw him in follow-up of his catheterization and loop recorder on May 19, 2019.  Over the last several weeks, Mr. Kronenberger has felt well. He denies any episodes of chest pain or shortness of breath. Loop recorder interrogation to date has not shown any events.   Since I saw him in December 2020, he has been undergoing monthly device checks of his loop recorder.  He has normal device function.  There are no new symptom episodes or episodes of tachycardia, bradycardia or pauses.  There were no new AF episodes.  He underwent a follow-up echo Doppler study on July 05, 2019.  This essentially is unchanged from his prior evaluation and continues to show hyperdynamic LV function.  He has grade 1 diastolic dysfunction.  There is decreased motion of his left and noncoronary cusp of a trileaflet aortic valve.  Aortic stenosis is mild with a mean gradient of 10.5 and peak gradient of 18.8.  There is moderate aortic insufficiency.  Ascending aorta is mildly dilated at 40 mm.  There is moderate tricuspid regurgitation.  I saw him in February 2021 at which time he continued to feel well.  He specifically denied any chest pain, PND orthopnea, presyncope or syncope or awareness of palpitations.    He underwent recent laboratory now on his regimen of Zetia plus rosuvastatin 40 mg, and LDL cholesterol is now markedly reduced from 72 down to 30 with combination treatment.  There is minimally elevated AST and ALT levels of 45 and 5 respectively.  He remains asymptomatic.  I saw him on February 10, 2020.  He had seen Dr. Derinda [name] who is his primary provider andunderwent recent laboratory which I was able to obtain today after his office visit and called him to discuss the findings with him.  Laboratory from January 03, 2020 was excellent.  LDL cholesterol was 36.  He underwent particle assessment and LDL particle number was optimal at 905 as was small LDL particles at 138.  April lipoprotein B was excellent at 49 and LP(a) was excellent at 27.  He continues to be active and plays tennis and  does elliptical.  He also rides his bike.  He denies any anginal symptoms.  He continues to have monthly loop recorder checks.  His most recent evaluation did not reveal any events.   He was asymptomatic and continued to be on low-dose metoprolol succinate 12.5 mg daily with stable blood pressure.  I saw him on August 15, 2020.  Since his prior evaluation he had a follow-up echo Doppler study on August 09, 2020.  This essentially was unchanged from previously and showed an EF of 60 to 65% with grade 1 diastolic dysfunction.  There was mild asymmetric LVH at of the basal septal segment.  There was moderate calcification of a trileaflet aortic valve with mild aortic stenosis.  His mean gradient was 10 mmHg with a peak gradient of 18.3.  He continued to be on Zetia and rosuvastatin 40 mg for hyperlipidemia and metoprolol succinate 12.5 mg. He denied any palpitations.  He continues to undergo remote loop recorder check at 58-month intervals.  He has not had any clinically significant events and has normal battery status.   I last saw him on January 24, 2021 and since his prior evaluation he continued to be stable.  He specifically denied presyncope or syncope or awareness of palpitations.  He denied any chest pain or exertional dyspnea.  His resting pulse is in the 50s and he remains asymptomatic.  He had undergone laboratory by Dr. Derinda [name] in December 2021.  Total cholesterol was 102, HDL 45, triglycerides 65, and LDL 43.  Renal function was stable with a creatinine of 1.1.  He continues to be active and rides a bike.  He will be going to [place] at the end of next week.  I reviewed his recent implantable loop recorder downloads.  He has not had any clinically significant events.   Since I last saw him, very continued to do well.  He continues to play tennis and rides his bike.  He is unaware of any palpitations.  He has not had any arrhythmias noted on his loop recorder interrogations.  He will be traveling  to [place] in [place].  He continues to be on Zetia 10 mg and rosuvastatin 40 mg for aggressive lipid management.  In September 2022 LDL was 38 with total cholesterol 101 and triglycerides 77.  He tells me that Dr. Alveria Apley could not check complete laboratory in January.  I do not have these results and will contact his office so that they can be sent to me for my review.  He continues to be on metoprolol succinate 12.5 mg daily and aspirin 81 mg with his CAD.  He presents for evaluation  Past Medical History:  Diagnosis Date   Bladder stones    BPH (benign prostatic hyperplasia)    Hernia of abdominal cavity    History of kidney stones    Hyperlipidemia    Incomplete right bundle branch block    Low O2 saturation 04/04/2015   Wife says O2 sats are in the low 90s base line.  Now in the 43s.  Will do incentive spirometry   Primary localized osteoarthritis of right knee 04/02/2015   Sarcoidosis    diagnosed 8-9 yrs ago   Upper GI bleed 04/04/2015   Patient had coffee ground emesis that was Guiac positive on Monday.  EGD scheduled for 04/04/2015. Started on IV Protonix    Past Surgical History:  Procedure Laterality Date   ANTERIOR CERVICAL DECOMP/DISCECTOMY FUSION  2007   BRONCHOSCOPY  2007   CARDIOVASCULAR STRESS TEST  04/29/2012   normal nuclear stress study.    CYSTOSCOPY  2004   DOPPLER ECHOCARDIOGRAPHY  04/29/2012   EF not noted. small perimembranous ventricular septal defect. small left to right ventricular shunt. moderate diffuse calcification involving noncoronary cusp of the aortic valve.    ESOPHAGOGASTRODUODENOSCOPY  04/04/2015   ESOPHAGOGASTRODUODENOSCOPY N/A 04/04/2015   Procedure: ESOPHAGOGASTRODUODENOSCOPY (EGD);  Surgeon: Manus Gunning, MD;  Location: [facility];  Service: Gastroenterology;  Laterality: N/A;   INGUINAL HERNIA REPAIR Right 2009   LEFT HEART CATH AND CORONARY ANGIOGRAPHY N/A 05/02/2019   Procedure: LEFT HEART CATH AND CORONARY  ANGIOGRAPHY;  Surgeon: Troy Sine, MD;  Location: [facility];  Service: Cardiovascular;  Laterality: N/A;   LOOP RECORDER INSERTION N/A 05/02/2019   Procedure: LOOP RECORDER INSERTION;  Surgeon: Sanda Klein, MD;  Location: [facility];  Service: Cardiovascular;  Laterality: N/A;   TOTAL KNEE ARTHROPLASTY Right 04/02/2015   Procedure: TOTAL KNEE ARTHROPLASTY;  Surgeon: Elsie Saas, MD;  Location: [facility];  Service: Orthopedics;  Laterality: Right;   VIDEO BRONCHOSCOPY Bilateral 09/23/2017   Procedure: VIDEO BRONCHOSCOPY WITH FLUORO;  Surgeon: Rigoberto Noel, MD;  Location: [facility];  Service: Cardiopulmonary;  Laterality: Bilateral;    No Known Allergies  Current Outpatient Medications  Medication Sig Dispense Refill   aspirin EC 81 MG tablet Take 1 tablet (81 mg total) by mouth daily. 90 tablet 3   Cholecalciferol (VITAMIN D) 50 MCG (2000 UT) CAPS Take 2,000 Units by mouth daily.      ezetimibe (ZETIA) 10 MG tablet TAKE (1) TABLET DAILY AT BEDTIME. 90 tablet 3   finasteride (PROSCAR) 5 MG tablet Take 5 mg by mouth every evening.      potassium citrate (UROCIT-K) 10 MEQ (1080 MG) SR tablet Take 10 mEq by mouth daily.     rosuvastatin (CRESTOR) 40 MG tablet Take 40 mg by mouth daily.     metoprolol succinate (TOPROL-XL) 25 MG 24 hr tablet Take 0.5 tablets (12.5 mg total) by mouth every other day.  23 tablet 3   No current facility-administered medications for this visit.    Social History   Socioeconomic History   Marital status: Married    Spouse name: Not on file   Number of children: Not on file   Years of education: Not on file   Highest education level: Not on file  Occupational History   Not on file  Tobacco Use   Smoking status: Former    Types: Pipe    Quit date: 05/26/1972    Years since quitting: 49.5   Smokeless tobacco: Never   Tobacco comments:    Quit 45 year  Substance and Sexual Activity   Alcohol use: Yes    Alcohol/week: 4.0 standard  drinks of alcohol    Types: 4 Standard drinks or equivalent per week   Drug use: No   Sexual activity: Not on file  Other Topics Concern   Not on file  Social History Narrative   Not on file   Social Determinants of Health   Financial Resource Strain: Not on file  Food Insecurity: Not on file  Transportation Needs: Not on file  Physical Activity: Not on file  Stress: Not on file  Social Connections: Not on file  Intimate Partner Violence: Not on file   Socially he is married. He  9 grandchildren. One son lives in [place], a daughter lives in [place]. There is no tobacco use. He does take occasional alcohol. He does exercise.  Family History  Problem Relation Age of Onset   Ovarian cancer Mother    Heart attack Father 100    ROS General: Negative; No fevers, chills, or night sweats;  HEENT: Negative; No changes in vision or hearing, sinus congestion, difficulty swallowing Pulmonary: Negative; No cough, wheezing, shortness of breath, hemoptysis Cardiovascular: See HPI GI: Negative; No nausea, vomiting, diarrhea, or abdominal pain GU: Negative; No dysuria, hematuria, or difficulty voiding Musculoskeletal: Recent bilateral nasal bone fractures and fracture of right zygomatic arch Hematologic/Oncology: Negative; no easy bruising, bleeding Remote history of sarcoidosis, completely resolved Endocrine: Negative; no heat/cold intolerance; no diabetes Neuro: Amnesic to his syncopal spell Skin: Negative; No rashes or skin lesions Psychiatric: Negative; No behavioral problems, depression Sleep: Negative; No snoring, daytime sleepiness, hypersomnolence, bruxism, restless legs, hypnogognic hallucinations, no cataplexy Other comprehensive 14 point system review is negative.   PE BP 120/62 (BP Location: Left Arm)   Pulse (!) 54   Ht $R'5\' 9"'xj$  (1.753 m)   Wt 194 lb 6.4 oz (88.2 kg)   SpO2 94%   BMI 28.71 kg/m    Repeat blood pressure by me was 122/60 supine and 120/60 standing  Wt  Readings from Last 3 Encounters:  11/19/21 194 lb 6.4 oz (88.2 kg)  01/24/21 191 lb 3.2 oz (86.7 kg)  08/15/20 195 lb (88.5 kg)   General: Alert, oriented, no distress.  Skin: normal turgor, no rashes, warm and dry HEENT: Normocephalic, atraumatic. Pupils equal round and reactive to light; sclera anicteric; extraocular muscles intact;  Nose without nasal septal hypertrophy Mouth/Parynx benign; Mallinpatti scale 2 Neck: No JVD, no carotid bruits; normal carotid upstroke Lungs: clear to ausculatation and percussion; no wheezing or rales Chest wall: without tenderness to palpitation Heart: PMI not displaced, RRR, s1 s2 normal, 1/6 systolic murmur, no diastolic murmur, no rubs, gallops, thrills, or heaves Abdomen: soft, nontender; no hepatosplenomehaly, BS+; abdominal aorta nontender and not dilated by palpation. Back: no CVA tenderness Pulses 2+ Musculoskeletal: full range of motion, normal strength, no joint deformities Extremities: no  clubbing cyanosis or edema, Homan's sign negative  Neurologic: grossly nonfocal; Cranial nerves grossly wnl Psychologic: Normal mood and affect    November 19, 2021 ECG (independently read by me): Sinus bradycardia at 54, LAD, RBBB, LVH  January 24, 2021 ECG (independently read by me): Sinus bradycardia at 52; RBBB with repolarization, LVH  August 15, 2020 ECG (independently read by me): Sinus bradycardia at 57, RBBB with repolarization changes  September 2021 ECG (independently read by me): Sinus bradycardia at 56, RBBB with repolarization, LVH by voltage; normal intervals, no ectopy  February 12, 20212 ECG (independently read by me): Sinus bradycardia 58 bpm, right bundle branch block with repolarization changes.  Borderline criteria for LVH.  No ectopy.  QTc interval 447 ms  December 2020 ECG (independently read by me): Sinus bradycardia at 54 bpm, right bundle branch block with repolarization changes.  Left axis deviation.  Borderline voltage criteria  for LVH  April 08, 2019 ECG (independently read by me): Sinus rhythm at 65 bpm, left axis deviation, right bundle branch block with repolarization changes.  LVH by voltage.  QTc interval 453 ms.  PR interval 146 ms.  September 2020 ECG (independently read by me): Sinus bradycardia at 53 bpm, right bundle branch block with repolarization changes.  QRS duration 156 ms.  Mild LVH by voltage in aVL.  QTc interval 439 ms  March 2019 ECG (independently read by me): normal sinus rhythm at 65 bpm.  One isolated PVC.  Incomplete right bundle branch block.  Borderline LVH.  Normal intervals.  February 2018 ECG (independently read by me): Sinus bradycardia at 56 bpm.  Mild sinus arrhythmia.  Mild LVH.  Normal intervals.  No ST segment changes.  February 2017 ECG (independently read by me):  Normal sinus rhythm at 63 bpm.  Early transition.  No ST segment changes.  December 2015 ECG (independently read by me): Normal sinus rhythm at 66 bpm.  Mild RV conduction delay.  Early transition.  December 2014 ECG: Sinus rhythm with an occasional PVC; normal intervals.  LABS: I personally reviewed the laboratory from [place] done on 02/19/2016.  BUN 18, creatinine 0.9.  Ingrown 14.7, hematocrit 44.9.  Normal LFTs.  TSH 2.08.  April lipoprotein B 66.  Normal PSA.  Lipid studies as noted above.  Lpid studies from March 03, 2017: Total cholesterol 134, HDL 39, LDL 70, triglycerides 124.     After he left the office I was able to obtain accurate from Dr. Sandi Mariscal from his blood work on Mr. Vroom from December 2021: Cholesterol 102, HDL 45, triglycerides 65, and LDL 43. Hypoprotein particle analysis small LDL particles excellent at 281. CMP normal. CBC normal. A1c 5.8. Vitamin D 26.3.     Latest Ref Rng & Units 02/15/2021    8:12 AM 06/30/2019    8:59 AM 04/28/2019    2:20 PM  BMP  Glucose 65 - 99 mg/dL 83  88  106   BUN 8 - 27 mg/dL $Remove'19  16  21   'aMspFde$ Creatinine 0.76 - 1.27 mg/dL 0.98  1.03   0.91   BUN/Creat Ratio 10 - $Re'24 19  16  23   'QBm$ Sodium 134 - 144 mmol/L 139  138  139   Potassium 3.5 - 5.2 mmol/L 5.0  4.8  4.9   Chloride 96 - 106 mmol/L 101  103  102   CO2 20 - 29 mmol/L $RemoveB'24  23  27   'vMZElGud$ Calcium 8.6 - 10.2 mg/dL 10.1  10.0  10.0  Latest Ref Rng & Units 02/15/2021    8:12 AM 06/30/2019    8:59 AM 03/20/2015    8:57 AM  Hepatic Function  Total Protein 6.0 - 8.5 g/dL 6.6  6.6  6.6   Albumin 3.6 - 4.6 g/dL 4.3  4.3  4.0   AST 0 - 40 IU/L 33  45  29   ALT 0 - 44 IU/L 44  54  30   Alk Phosphatase 44 - 121 IU/L 77  73  68   Total Bilirubin 0.0 - 1.2 mg/dL 0.8  0.7  0.8       Latest Ref Rng & Units 02/15/2021    8:12 AM 06/30/2019    8:59 AM 04/28/2019    2:20 PM  CBC  WBC 3.4 - 10.8 x10E3/uL 8.2  6.7  9.5   Hemoglobin 13.0 - 17.7 g/dL 14.3  14.1  14.5   Hematocrit 37.5 - 51.0 % 43.4  43.6  44.1   Platelets 150 - 450 x10E3/uL 149  146  187    Lab Results  Component Value Date   MCV 92 02/15/2021   MCV 92 06/30/2019   MCV 91 04/28/2019   Additional studies reviewed: I reviewed all the records from [facility] including his CT images of January 25, 2019 following his recent syncopal spell induced  Trauma.  ECHO 7//28/2020 IMPRESSIONS  1. The left ventricle has normal systolic function with an ejection fraction of 60-65%. The cavity size was normal. There is moderate asymmetric left ventricular hypertrophy. Left ventricular diastolic Doppler parameters are consistent with  pseudonormalization.  2. The right ventricle has normal systolic function. The cavity was normal. There is no increase in right ventricular wall thickness. Right ventricular systolic pressure could not be assessed.  3. Left atrial size was mildly dilated.  4. The aortic valve is abnormal. Moderate calcification of the aortic valve. Aortic valve regurgitation is trivial by color flow Doppler. Mild stenosis with a mean systolic gradient of 11 mmHg of the aortic valve.  5. The aorta is  normal in size and structure when indexed to body surface area (ascending aorta measures 38 mm).  CARDIAC MRI 02/11/2019 IMPRESSION: 1. Normal left ventricular size with mild LV hypertrophy. This appears concentric and not asymmetric. EF 60%, normal wall motion.   2. The RV is mildly dilated with mild to moderate hypokinesis, EF 35%.   3. There is no definite myocardial LGE, so no definitive evidence for prior MI, infiltrative disease, or myocarditis.   Abnormal right ventricle. However, there is no definitive evidence for cardiac sarcoidosis or hypertrophic cardiomyoapthy.  ----------------------------------------------------------------------------------------------------------- CTA CORONARY :  IMPRESSION: 03/07/2019  1. Coronary calcium score of 0. This was 0 percentile for age and sex matched control.   2. Normal coronary origin with right dominance.   3. No evidence of CAD.   Fransico Him  ADDENDUM/ Edited Result :  CTA  Aorta: Normal size. Calcifications of the aortic root and ascending aorta. No dissection.   Aortic Valve:  Trileaflet.  Moderate calcifications.   Coronary Arteries:  Normal coronary origin.  Left dominance.   RCA is a small non dominant artery that gives rise to PDA and PLVB. There is mild calcified plaque in the proximal RCA with associated stenosis of 25-49%. There is motion artifact in the mid and distal RCA.   Left main is a large artery that gives rise to LAD and LCX arteries. There is moderate calcified plaque in the mid to distal LM  with associated 50-69% stenosis.   LAD is a large vessel that gives rise to a large D1 and moderate sized D2. There is moderate calcified plaque in the proximal LAD with associated 50-69% stenosis. There is severe calcified plaque in the mid LAD with associated stenosis of 70-99%.   LCX is a large dominant artery that gives rise to one large OM1 branch. There is minimal calcified plaque in the proximal to mid  LCx with associated stenosis of 0-24%. There is mild calcified plaque in the mid LCx at the takeoff of a moderate sized branching OM with associated stenosis of 25-49%. There is minimal atherosclerosis of the distal LCx with associated stenosis of 0 - 24%.   Other findings:   Normal pulmonary vein drainage into the left atrium.   Normal let atrial appendage without a thrombus.   Normal size of the pulmonary artery.   IMPRESSION: 1. Coronary calcium score of 1151. This was 66th percentile for age and sex matched control.   2.  Normal coronary origin with left dominance.   3.  Moderate to severe calcified AV cusps.   4.  Atherosclerosis of the aortic root and ascending aorta.   5. Moderate Calcified plaque in the distal LM and severe calcified plaque in the mid LAD. CAD-RADS 4b. Significant blooming may over estimate LAD stenosis.   6.  Recommend aggressive risk factor modification.   7.  Recommend cardiac catheterization.   8.  Study has been sent for [facility] analysis.   Traci Turner  -----------------------------------------------------------------------------------------  FFR FINDINGS: FFRCT analysis was performed on the original cardiac CT angiogram dataset. Diagrammatic representation of the FFRct analysis is provided in a separate PDF document in PACS. This dictation was created using the PDF document and an interactive 3D model of the results. 3D model is not available in the EMR/PACS. Normal FFR range is >0.80.   1. No significant flow limiting lesion: LM FFR = 0.99.   2. LAD: No significant flow limiting lesion: Proximal FFR = 0.98, Mid FFR = 0.96, Distal FFR = 0.89.   3. LCX: No significant flow limiting lesion: Proximal FFR = 0.97, Mid FFR = 0.94, Distal FFR = 0.91.   4. RCA: No flow limiting lesion in non dominant small RCA: Proximal FFR = 1.00, Mid FFR = 0.97, distal FFR not obtained.   IMPRESSION: 1. CT FFR analysis show no hemodynamically significant  flow limiting lesions.   ------------------------------------------------------------------------------------------- CARDIAC CATH 05/02/2019 Mid LM to Dist LM lesion is 30% stenosed. Prox LAD lesion is 30% stenosed. Mid LAD lesion is 20% stenosed. Dist LAD lesion is 40% stenosed.   There is evidence for mild to moderate coronary calcification without high-grade obstructive disease.  There is smooth distal tapering of the left main with 30% narrowing; 30% proximal and 20% mid ossified stenoses in the LAD with 40% diffuse stenosis in a small caliber mid LAD vessel.  The left circumflex vessel is an angiographically normal dominant vessel; the RCA is normal nondominant vessel.  There is a small caliber ramus intermediate branch which appears to fill directly into the left ventricle resulting in ventricular opacification,   Mild transvalvular aortic gradient at 10 to 36mmHg going from the left ventricle to the aorta consistent with his known very mild aortic valve stenosis.  There is mild aortic valve calcification.   RECOMMENDATION:  The patient will continue on aspirin therapy as well as his low-dose beta-blocker therapy.  A loop recorder will be placed in the short stay following his  catheterization with careful close monitoring of his rhythm status.  Continue aggressive lipid-lowering therapy with target LDL ideally in the 50s or below.  The patient was recently switched from simvastatin to rosuvastatin 40 mg.       LOOP RECORDER DEVICE CHECK: 06/08/2019 [facility] summary report received. Battery status OK. Normal device function. No new symptom episodes, tachy episodes, brady, or pause episodes. No new AF episodes. Monthly summary reports and ROV/PRN  ECHO 07/05/2019 IMPRESSIONS   1. Left ventricular ejection fraction, by estimation, is 65 to 70%. The  left ventricle has hyperdynamic function. The left ventrical has no  regional wall motion abnormalities. Left ventricular diastolic parameters   are consistent with Grade I diastolic  dysfunction (impaired relaxation).   2. Right ventricular systolic function is normal. The right ventricular  size is mildly enlarged. There is moderately elevated pulmonary artery  systolic pressure. The estimated right ventricular systolic pressure is  46.2 mmHg.   3. No evidence of mitral valve regurgitation.   4. Tricuspid valve regurgitation is moderate.   5. Decreased motion of left and non coronary cusp. The aortic valve is  tricuspid. Aortic valve regurgitation is moderate. Mild aortic valve  stenosis. Aortic valve area, by VTI measures 1.78 cm. Aortic valve mean  gradient measures 10.5 mmHg. Aortic  valve Vmax measures 2.17 m/s.   6. Aortic dilatation noted. 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   7. The inferior vena cava is normal in size with greater than 50%  respiratory variability, suggesting right atrial pressure of 3 mmHg.   Comparison(s): No significant change from prior study. Prior images  reviewed side by side.    ECHO 08/09/2020: IMPRESSIONS   1. Left ventricular ejection fraction, by estimation, is 60 to 65%. The  left ventricle has normal function. The left ventricle has no regional  wall motion abnormalities. There is mild asymmetric left ventricular  hypertrophy of the basal-septal segment.  Left ventricular diastolic parameters are consistent with Grade I  diastolic dysfunction (impaired relaxation).   2. Right ventricular systolic function is normal. The right ventricular  size is mildly enlarged.   3. The mitral valve is grossly normal. No evidence of mitral valve  regurgitation. No evidence of mitral stenosis.   4. The aortic valve is tricuspid. There is moderate calcification of the  aortic valve. There is moderate thickening of the aortic valve. Aortic  valve regurgitation is mild. Mild aortic valve stenosis. Aortic valve  area, by VTI measures 3.07 cm. Aortic  valve mean  gradient measures 10.0 mmHg. Aortic valve Vmax measures 2.14  m/s.   Comparison(s): No significant change from prior study. LV function  unchanged. Aortic stenosis remains mild. Mild AI on this study.    IMPRESSION:  1. CAD in native artery   2. High coronary artery calcium score: 1151   3. Mild aortic stenosis   4. RBBB (right bundle branch block)   5. Hyperlipidemia with target LDL less than 70   6. Paroxysmal SVT (supraventricular tachycardia) (HCC)     ASSESSMENT AND PLAN: Mr. Ramthun is an active 88 year old gentleman who has a remote history of prior sarcoid disease with ultimate complete remisson.  He has a history of mild hyperlipidemia which has been aggressively controlled.  Remotely, he was found to have mild coronary calcification on CT imaging.  I have been aggressive with lipid therapy in attempt to potentially induce plaque regression and previously he has been on Zetia/simvastatin 10/40 with LDL cholesterols  previously documented to be 58.  In 2016 he was found to have aortic stenosis which was mild with mild aortic insufficiency with a mean gradient of 9 and peak gradient of 18.  Around Labor Day in 2020 while [place] the beach [place] on a hot day he had a syncopal spell.  He has no recollection of falling and when he awakened he was in [facility] emergency room.  It does not appear that he had any significant awareness of rhythm disturbance and apparently when EMS arrived he must have had 6 stable cardiac rhythm.  In July 2020  a follow-up echo Doppler study confirmed just mild aortic stenosis with a valve area of 1.7 cm.  It is unlikely that the severity of his aortic stenosis accounts for his syncope.  Recently, his ECG showed definite right bundle branch block which in the past had just been incomplete right bundle.  On his ECG of February 14, 2019 he was in sinus rhythm, although bradycardic at 53 with right bundle branch block.  QTc interval was normal.  I was concerned  potential ischemic etiology scheduled him to undergo a coronary CTA for further evaluation of potential significant coronary obstructive disease. I  also scheduling him for carotid duplex imaging to assess his carotid arteries and vertebral flow.  His Zio patch findings demonstrated predominant sinus rhythm but revealed several episodes of supraventricular tachycardia, as well as episodes of isolated ectopic in isolated ventricular ectopy.  His fastest heartbeat was 190 bpm which lasted 4 beats and was consistent with SVT.  The longest SVT episode was 12.8 seconds with an average rate of 138 bpm. 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  Definitive cardiac catheterization demonstrated mild to moderate coronary calcification with nonobstructive plaque in his left main and LAD of approximately 30%. He continues to remain asymptomatic and is on a more aggressive treatment to attempt plaque regression rosuvastatin 40 mg and Zetia 10 mg.  Lipid studies in February 2021 showed an LDL of 30.  Most recent laboratory by Dr. Sandi Mariscal in December 2021 continue to show excellent labs with LDL cholesterol at 43, triglycerides 65 HDL 45 and total cholesterol 102.  LDL particle number was low at 732.  I have continued to be aggressive with lipid-lowering therapy and he is tolerating Zetia 10 mg and rosuvastatin 40 mg combination.  His most recent echo Doppler study in March 2022 essentially was unchanged from his prior evaluation and continues to show an EF of 60 to 25%, grade 1 diastolic dysfunction, mild asymmetric LVH at the basal septum and moderate calcification of a trileaflet aortic valve with mild aortic stenosis (mean gradient 10 and peak gradient 18 mmHg).  Laboratory from September 2022 continue to be excellent with LDL cholesterol of 38 and attempt to induce plaque regression.  He is tolerating rosuvastatin 40 mg and Zetia 10 mg.  His blood pressure today is  stable without orthostatic change.  His resting pulse is 54.  I have suggested he try reducing metoprolol to 12.5 mg every other day rather than daily.  I have recommended continuation of 81 mg aspirin in light of his CAD and tolerability.  I reviewed his most recent loop recorder analysis and this has been stable without arrhythmia.  I will see him in 6 months for reevaluation   Troy Sine, MD, [facility]  11/22/2021 9:38 PM

## 2021-11-21 NOTE — Progress Notes (Signed)
Carelink Summary Report / Loop Recorder 

## 2021-11-22 ENCOUNTER — Encounter: Payer: Self-pay | Admitting: Cardiovascular Disease

## 2021-11-28 ENCOUNTER — Ambulatory Visit (INDEPENDENT_AMBULATORY_CARE_PROVIDER_SITE_OTHER): Payer: Medicare Other

## 2021-11-28 DIAGNOSIS — R55 Syncope and collapse: Secondary | ICD-10-CM

## 2021-11-28 LAB — CUP PACEART REMOTE DEVICE CHECK
Date Time Interrogation Session: 20230705230921
Implantable Pulse Generator Implant Date: 20201207

## 2021-11-29 ENCOUNTER — Telehealth: Payer: Self-pay

## 2021-11-29 NOTE — Telephone Encounter (Signed)
Received labs from [place] office- will scan into chart.  Highlights:   Cholesterol- 96 HDL- 38 LDL- 42 LPa- 42 Trigs- 925 Apolipoprotein B- 58 Glucose- 88 Creatinine- 1.12 GFR- 64 Sodium- 139 Potassium- 4.4 AST- 34 ALT- 40

## 2021-12-02 NOTE — Telephone Encounter (Signed)
Labs reviewed;  ? If TG value is accurate.

## 2021-12-03 NOTE — Telephone Encounter (Signed)
Trigs were not typed correctly- will review once scanned into chart.  Thanks!

## 2021-12-17 NOTE — Progress Notes (Signed)
Carelink Summary Report / Loop Recorder 

## 2021-12-30 ENCOUNTER — Ambulatory Visit (INDEPENDENT_AMBULATORY_CARE_PROVIDER_SITE_OTHER): Payer: Medicare Other

## 2021-12-30 DIAGNOSIS — R55 Syncope and collapse: Secondary | ICD-10-CM | POA: Diagnosis not present

## 2021-12-31 LAB — CUP PACEART REMOTE DEVICE CHECK
Date Time Interrogation Session: 20230807230710
Implantable Pulse Generator Implant Date: 20201207

## 2022-01-30 ENCOUNTER — Ambulatory Visit (INDEPENDENT_AMBULATORY_CARE_PROVIDER_SITE_OTHER): Payer: Medicare Other

## 2022-01-30 DIAGNOSIS — R55 Syncope and collapse: Secondary | ICD-10-CM

## 2022-01-30 NOTE — Progress Notes (Signed)
Carelink Summary Report / Loop Recorder 

## 2022-02-04 LAB — CUP PACEART REMOTE DEVICE CHECK
Date Time Interrogation Session: 20230909230624
Implantable Pulse Generator Implant Date: 20201207

## 2022-02-15 NOTE — Progress Notes (Signed)
Carelink Summary Report / Loop Recorder 

## 2022-03-03 ENCOUNTER — Ambulatory Visit (INDEPENDENT_AMBULATORY_CARE_PROVIDER_SITE_OTHER): Payer: Medicare Other

## 2022-03-03 DIAGNOSIS — R55 Syncope and collapse: Secondary | ICD-10-CM | POA: Diagnosis not present

## 2022-03-07 LAB — CUP PACEART REMOTE DEVICE CHECK
Date Time Interrogation Session: 20231012231100
Implantable Pulse Generator Implant Date: 20201207

## 2022-04-03 ENCOUNTER — Ambulatory Visit (INDEPENDENT_AMBULATORY_CARE_PROVIDER_SITE_OTHER): Payer: Medicare Other

## 2022-04-03 DIAGNOSIS — R55 Syncope and collapse: Secondary | ICD-10-CM | POA: Diagnosis not present

## 2022-04-03 LAB — CUP PACEART REMOTE DEVICE CHECK
Date Time Interrogation Session: 20231108231338
Implantable Pulse Generator Implant Date: 20201207

## 2022-04-04 NOTE — Progress Notes (Signed)
Carelink Summary Report / Loop Recorder 

## 2022-04-11 NOTE — Progress Notes (Signed)
Carelink Summary Report / Loop Recorder 

## 2022-04-28 ENCOUNTER — Telehealth: Payer: Self-pay

## 2022-04-28 NOTE — Telephone Encounter (Signed)
Following alert received from [employer] received for  IRL alert for tachy, EGM shows 15sec of self limiting NSVT, rate 162 Event occurred 12/2 @ 20:25 .  Attempted to contact patient to discuss s/s and medication compliance. No answer, LMTCB.

## 2022-04-29 NOTE — Telephone Encounter (Signed)
Call back received from Pt.  He was asymptomatic during this period of time.  No dizziness/syncope.  Advised would continue to monitor.  Will route to Dr. Loletha Grayer as Juluis Rainier.

## 2022-04-29 NOTE — Telephone Encounter (Signed)
Per review of [employer] website-episode lasted 5 seconds.    Left message requesting call back to device clinic to assess for symptoms.

## 2022-04-29 NOTE — Telephone Encounter (Signed)
Episode of lengthy nonsustained VT, 168 bpm. This is the only episode in 3 years of monitoring by [facility]. Recent normal echo, EF 60%. Relatively recent normal labs. Episode was asymptomatic. He is on a beta blocker. Advise continued monitoring, defer decision regarding additional workup to DR. Claiborne Billings.

## 2022-05-05 ENCOUNTER — Ambulatory Visit (INDEPENDENT_AMBULATORY_CARE_PROVIDER_SITE_OTHER): Payer: Medicare Other

## 2022-05-05 DIAGNOSIS — R55 Syncope and collapse: Secondary | ICD-10-CM

## 2022-05-05 LAB — CUP PACEART REMOTE DEVICE CHECK
Date Time Interrogation Session: 20231210231624
Implantable Pulse Generator Implant Date: 20201207

## 2022-05-09 ENCOUNTER — Encounter: Payer: Self-pay | Admitting: Cardiovascular Disease

## 2022-05-09 ENCOUNTER — Ambulatory Visit: Payer: Medicare Other | Attending: Cardiovascular Disease | Admitting: Cardiovascular Disease

## 2022-05-09 VITALS — BP 124/74 | HR 55 | Ht 69.0 in | Wt 194.0 lb

## 2022-05-09 DIAGNOSIS — I2510 Atherosclerotic heart disease of native coronary artery without angina pectoris: Secondary | ICD-10-CM | POA: Diagnosis not present

## 2022-05-09 DIAGNOSIS — I4729 Other ventricular tachycardia: Secondary | ICD-10-CM | POA: Insufficient documentation

## 2022-05-09 DIAGNOSIS — E785 Hyperlipidemia, unspecified: Secondary | ICD-10-CM | POA: Diagnosis not present

## 2022-05-09 DIAGNOSIS — I350 Nonrheumatic aortic (valve) stenosis: Secondary | ICD-10-CM | POA: Diagnosis not present

## 2022-05-09 NOTE — Patient Instructions (Signed)
Medication Instructions:  No changes *If you need a refill on your cardiac medications before your next appointment, please call your pharmacy*   Lab Work: No labs If you have labs (blood work) drawn today and your tests are completely normal, you will receive your results only by: Arnold (if you have MyChart) OR A paper copy in the mail If you have any lab test that is abnormal or we need to change your treatment, we will call you to review the results.   Testing/Procedures: No Testing   Follow-Up: At [facility], you and your health needs are our priority.  As part of our continuing mission to provide you with exceptional heart care, we have created designated Provider Care Teams.  These Care Teams include your primary Cardiologist (physician) and Advanced Practice Providers (APPs -  Physician Assistants and Nurse Practitioners) who all work together to provide you with the care you need, when you need it.  We recommend signing up for the patient portal called "MyChart".  Sign up information is provided on this After Visit Summary.  MyChart is used to connect with patients for Virtual Visits (Telemedicine).  Patients are able to view lab/test results, encounter notes, upcoming appointments, etc.  Non-urgent messages can be sent to your provider as well.   To learn more about what you can do with MyChart, go to [email or web address].    Your next appointment:   6 month(s)  The format for your next appointment:   In Person  Provider:   Shelva Majestic, MD

## 2022-05-09 NOTE — Progress Notes (Addendum)
Patient ID: Eugene [name], male   DOB: [birth year], 88 y.o.   MRN: [id number]     Primary MD: Dr. Derinda Late  HPI: Eugene [name] is a 88 y.o. male who presents to the office today for a 6 month cardiology evaluation.  Eugene [name] has a remote history of sarcoidosis and had been followed at [facility] with complete remission. He has a history of mild hyperlipidemia and has been on Vytorin 10/40. He also has a history of BPH on Proscar.  An echo Doppler study which was done to evaluate a systolic murmur in the aortic region noted in December 2013 showed normal systolic function with mild focal basal hypertrophy of the septum. He had normal systolic function. There was evidence for a moderate diffuse calcification involving the noncoronary cusp of a trileaflet aortic valve. He did have sclerosis without stenosis. Valve area was 2.55 cm. RV size was upper normal to mildly dilated. A nuclear perfusion study done in 2013 was unchanged from 6 years previously and continued to show normal perfusion.  Laboratory done by Dr. Deforest Hoyles in August 2015 was normal with a hemoglobin of 14.5, hematocrit 43.8.  Renal function was excellent with a BUN of 19, Cr 0.99.  Fasting glucose was 78.  Lipid studies remained excellent with a total cholesterol 125, triglycerides 14, HDL cholesterol 44, and LDL cholesterol at 60.  Angiotensin converting enzyme was normal at 37 and last year was 33.  He underwent an echo Doppler study in 03/13/2015.  This revealed an ejection fraction at 60-65%. There was grade 1 diastolic dysfunction.  He had normal LV filling pressures.  His aortic valve was calcified and there was evidence for mild aortic stenosis with a mean gradient of 9, peak gradient of 18, and valve area of 1.87 cm and mild aortic insufficiency.  There was mild mitral annular calcification with trivial MR , mild LA dilatation, and moderate TR with PA pressure 30 mm. He underwent right knee replacement  surgery by Dr. Moshe Salisbury in November.  Postoperatively had some swelling and lower extremity venous studies were negative.  When I saw [name] in 2017, he was doing well from a cardiac standpoint.   Specifically, he denied any episodes of chest pain.  He was able to play tennis again following his knee surgery.  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  He had also undergone follow-up CT imaging asked and was found to have a new irregularly-shaped nodular density in the left lower lobe, alt to represent a possible focus of active infection/inflammation.  A follow-up CT was recommended.  After a course of antimicrobial therapy.  His CT also demonstrated a stable ectatic.  Borderline aneurysmal ascending thoracic aorta at 3.9 cm.  He had evidence for coronary calcification.   Laboratory in 2017 by his primary physician showed total cholesterol was 134, triglycerides 79, HDL 43, and LDL 75 on his current dose of ezetimibe 10 mg and simvastatin 40 mg.   When I saw [name] in March 2019 he was remaining active and was walking on elliptical machine at least 3-4 times per week and was playing tennis 2 times per week.  He denied any chest pain or shortness of breath.  He denies palpitations.   In 2019, he was found to have a pulmonary infiltrate for which she was evaluated  by Dr. Elsworth Soho.  He had undergone bronchoscopy with biopsy and ultimately underwent an evaluation at [facility] and this nodule had not significantly changed over the past 16 years.  I evaluated [name] in May 2020 and a telemedicine visit.  At that time he denied any chest pain, palpitations, presyncope or syncope.  He denied any PND orthopnea.  He had previously been followed by Dr. Ardeth Perfect but he had recently signed up for concierge medicine with Dr. Derinda Late.  During that evaluation I recommended he  undergo a follow-up echo Doppler study to reassess his aortic valve since in 2016 he was found to have mild aortic stenosis.  He underwent an echo Doppler study on December 21, 2018 which continues to show normal systolic function with EF at 60 to 65%, and moderate asymmetric left ventricular hypertrophy.  There was grade 2 diastolic dysfunction.  There was mild aortic stenosis with moderate calcification of the aortic valve.  He had a mean systolic gradient of 11 mm and his peak gradient was 20.8 mm.  Calculated aortic valve was 1.7 cm and was consistent with mild aortic stenosis.  On January 25, 2019 while biking at [place] he apparently had a syncopal spell.  He was found by bystanders.  Apparently he is amnesic to the event and when he woke up he was at [facility] in the emergency room.  It did not appear that he was aware when he had fallen since there was no apparent marks on his hands to break a fall.  A CT scan of the maxillofacial area demonstrated a comminuted displaced bilateral nasal bone fracture as well as fracture of the right zygomatic arch.  There was a frontal scalp hematoma.  He had extensive CT imaging of his chest abdomen and pelvis.  Of note, on his chest CT he was felt to have severe coronary artery atherosclerotic calcifications.  There was no evidence for acute traumatic injury within the chest abdomen or pelvis.  He had a 3.6 cm indeterminate liver mass, 4.3 cm indeterminate left renal lesion and had multiple bladder stones and bladder diverticula likely related to chronic outlet obstruction from an enlarged prostate and had nonobstructive bilateral nephrolithiasis.  Upon his return to [facility], he was evaluated by Hulan Saas in the [place] primary care.  He subsequently was evaluated by Dr. Al Pimple in our office for cardiology evaluation during my absence.  With his memory absence, it is felt that he had a concussion.  When seen by Dr. Harrell Gave,  she reviewed his extensive evaluation and recent echo which confirmed that he did not have significant aortic stenosis.  Because of his remote history of sarcoidosis, she scheduled [name] to undergo cardiac MRI imaging to assess for scar/infiltrative disease.  He was also scheduled to undergo 14-day Ziotelemetry monitor.    I saw [name] in follow-up of Dr. Harrell Gave on February 14, 2019.  At the time of his evaluation he had not yet received his Zio patch monitor.  I reviewed extensively his records from [facility] as well as the records of Dr. Harrell Gave.  His MR scan was reviewed and did not show any definitive myocardial late gadolinium enhancement so there was no definitive evidence for prior myocardial infarction, infiltrative disease or myocarditis.  LVEF was approximately 60%.  His RV EF was reported at 35% however when reviewed by Dr. Harrell Gave she had felt in 20 function was somewhat better.  I scheduled [name] to undergo coronary CTA to assess potential  CAD disease with silent ischemia leading to his clinical event.  He also underwent carotid duplex imaging.Marland Kitchen  His 14-day ZIO monitor showed predominant sinus rhythm with right bundle branch block.  He did have an average heart rate at 62 bpm with a minimum of 42 while sleeping and maximal heart rate of 190.  He had several short bursts of SVT the fastest interval lasting 4 beats with a maximum rate of 190.  The longest SVT episode was 12.8 seconds with an average heart rate of 138 bpm.  There was no VT or atrial fibrillation or high degree block or pauses noted.  Carotid duplex imaging essentially normal with normal antegrade vertebral and subclavian flow and was without evidence for internal carotid stenoses.  I received an initial report of his CT angiogram which came back as normal.  We had notified [name] of the results.  Apparently, there was an addendum to the report port when it was reinterpreted adjusted multivessel CAD with a calcium score  of 1151 with moderate calcific plaque in the mid distal left main, severe calcific plaque in the mid LAD mild calcific plaque in the circumflex and RCA.  There was concern of possible significant blooming artifact which may have contributed to overestimate of LAD stenoses.  Subsequent FFR analysis was normal and did not reveal any hemodynamic significant stenoses.   I had a long conversation with Eugene [name] as well as called his daughter in [place] in follow-up of his April 08, 2019 office visit.  At that time I recommended initiation of aspirin 81 mg and initiated metoprolol succinate 12.5 mg daily.  I also discussed changing [name] to more aggressive lipid-lowering therapy and he recently was started on rosuvastatin 40 mg in place of simvastatin. When I last saw [name] in the office on April 18, 2019 I recommended definitive cardiac catheterization in light of his coronary CT angio findings also recommended insertion of a loop monitor for potential long-term monitoring of potential cardiac arrhythmia.   Eugene [name] underwent cardiac catheterization on May 02, 2019 by me. There was mild to moderate coronary calcification without high-grade obstructive disease. There was a smooth distal tapering of his left main with 30% narrowing, 30% proximal and 20% mid calcified stenoses in the LAD with 40% diffuse stenosis in a small caliber mid LAD vessel. The left circumflex vessel was angiographically normal and dominant. The RCA was a normal nondominant vessel. Of note, there was a small caliber ramus intermediate branch which appeared to fill directly into the left ventricle resulting in left ventricular opacification. He had only very mild aortic stenosis with a transvalvular aortic gradient at 10 to 12 mm. There was mild aortic valve calcification. There that day before going home he underwent insertion of a loop recorder by Dr. Sallyanne Kuster.  I saw [name] in follow-up of his catheterization and loop recorder on May 19, 2019.  Over the last several weeks, Eugene [name] has felt well. He denies any episodes of chest pain or shortness of breath. Loop recorder interrogation to date has not shown any events.   Since I saw [name] in December 2020, he has been undergoing monthly device checks of his loop recorder.  He has normal device function.  There are no new symptom episodes or episodes of tachycardia, bradycardia or pauses.  There were no new AF episodes.  He underwent a follow-up echo Doppler study on July 05, 2019.  This essentially is unchanged from his prior evaluation and continues to show hyperdynamic LV  function.  He has grade 1 diastolic dysfunction.  There is decreased motion of his left and noncoronary cusp of a trileaflet aortic valve.  Aortic stenosis is mild with a mean gradient of 10.5 and peak gradient of 18.8.  There is moderate aortic insufficiency.  Ascending aorta is mildly dilated at 40 mm.  There is moderate tricuspid regurgitation.  I saw [name] in February 2021 at which time he continued to feel well.  He specifically denied any chest pain, PND orthopnea, presyncope or syncope or awareness of palpitations.    He underwent recent laboratory now on his regimen of Zetia plus rosuvastatin 40 mg, and LDL cholesterol is now markedly reduced from 72 down to 30 with combination treatment.  There is minimally elevated AST and ALT levels of 45 and 5 respectively.  He remains asymptomatic.  I saw [name] on February 10, 2020.  He had seen Dr. Derinda Late who is his primary provider andunderwent recent laboratory which I was able to obtain today after his office visit and called [name] to discuss the findings with [name].  Laboratory from January 03, 2020 was excellent.  LDL cholesterol was 36.  He underwent particle assessment and LDL particle number was optimal at 905 as was small LDL particles at 138.  April lipoprotein B was excellent at 49 and LP(a) was excellent at 27.  He continues to be active and plays tennis and  does elliptical.  He also rides his bike.  He denies any anginal symptoms.  He continues to have monthly loop recorder checks.  His most recent evaluation did not reveal any events.   He was asymptomatic and continued to be on low-dose metoprolol succinate 12.5 mg daily with stable blood pressure.  I saw [name] on August 15, 2020.  Since his prior evaluation he had a follow-up echo Doppler study on August 09, 2020.  This essentially was unchanged from previously and showed an EF of 60 to 65% with grade 1 diastolic dysfunction.  There was mild asymmetric LVH at of the basal septal segment.  There was moderate calcification of a trileaflet aortic valve with mild aortic stenosis.  His mean gradient was 10 mmHg with a peak gradient of 18.3.  He continued to be on Zetia and rosuvastatin 40 mg for hyperlipidemia and metoprolol succinate 12.5 mg. He denied any palpitations.  He continues to undergo remote loop recorder check at 63-monthintervals.  He has not had any clinically significant events and has normal battery status.   I saw [name] on January 24, 2021 and since his prior evaluation he continued to be stable.  He specifically denied presyncope or syncope or awareness of palpitations.  He denied any chest pain or exertional dyspnea.  His resting pulse is in the 50s and he remains asymptomatic.  He had undergone laboratory by Dr. PDerinda Latein December 2021.  Total cholesterol was 102, HDL 45, triglycerides 65, and LDL 43.  Renal function was stable with a creatinine of 1.1.  He continues to be active and rides a bike.  [place]  I reviewed his recent implantable loop recorder downloads.  He has not had any clinically significant events.   I last saw [name] on November 19, 2021 at which time he remained stable.  He continued to play tennis and ride his bike.  He was unaware of any palpitations.  He has not had any arrhythmias noted on his loop recorder interrogations.  He will  be traveling to [place] off the [place] in September.  He continues to be on Zetia 10 mg and rosuvastatin 40 mg for aggressive lipid management.  In September 2022 LDL was 38 with total cholesterol 101 and triglycerides 77.  He tells me that Dr. Sandi Mariscal checked complete laboratory in January.  I do not have these results and will contact his office so that they can be sent to me for my review.  He continues to be on metoprolol succinate 12.5 mg daily and aspirin 81 mg with his CAD.    Since I last saw [name], he has continued to be asymptomatic.  He stills plays tennis.  His most recent loop recorder download for the first time picked up a 15-second period of self-limiting nonsustained VT at a rate of 162.  The event occurred at 1225.  The patient was unaware of any tachycardia at the time.  He denies any presyncope or syncope.  He denies any chest pain.  He had laboratory by Dr. Sandi Mariscal.  He presents for reevaluation.  Past Medical History:  Diagnosis Date   Bladder stones    BPH (benign prostatic hyperplasia)    Hernia of abdominal cavity    History of kidney stones    Hyperlipidemia    Incomplete right bundle branch block    Low O2 saturation 04/04/2015   Wife says O2 sats are in the low 90s base line.  Now in the 84s.  Will do incentive spirometry   Primary localized osteoarthritis of right knee 04/02/2015   Sarcoidosis    diagnosed 8-9 yrs ago   Upper GI bleed 04/04/2015   Patient had coffee ground emesis that was Guiac positive on Monday.  EGD scheduled for 04/04/2015. Started on IV Protonix    Past Surgical History:  Procedure Laterality Date   ANTERIOR CERVICAL DECOMP/DISCECTOMY FUSION  2007   BRONCHOSCOPY  2007   CARDIOVASCULAR STRESS TEST  04/29/2012   normal nuclear stress study.    CYSTOSCOPY  2004   DOPPLER ECHOCARDIOGRAPHY  04/29/2012   EF not noted. small perimembranous ventricular septal defect. small left to right ventricular shunt. moderate diffuse calcification  involving noncoronary cusp of the aortic valve.    ESOPHAGOGASTRODUODENOSCOPY  04/04/2015   ESOPHAGOGASTRODUODENOSCOPY N/A 04/04/2015   Procedure: ESOPHAGOGASTRODUODENOSCOPY (EGD);  Surgeon: Manus Gunning, MD;  Location: [place];  Service: Gastroenterology;  Laterality: N/A;   INGUINAL HERNIA REPAIR Right 2009   LEFT HEART CATH AND CORONARY ANGIOGRAPHY N/A 05/02/2019   Procedure: LEFT HEART CATH AND CORONARY ANGIOGRAPHY;  Surgeon: Troy Sine, MD;  Location: [place] [facility];  Service: Cardiovascular;  Laterality: N/A;   LOOP RECORDER INSERTION N/A 05/02/2019   Procedure: LOOP RECORDER INSERTION;  Surgeon: Sanda Klein, MD;  Location: [place] [facility];  Service: Cardiovascular;  Laterality: N/A;   TOTAL KNEE ARTHROPLASTY Right 04/02/2015   Procedure: TOTAL KNEE ARTHROPLASTY;  Surgeon: Elsie Saas, MD;  Location: [place];  Service: Orthopedics;  Laterality: Right;   VIDEO BRONCHOSCOPY Bilateral 09/23/2017   Procedure: VIDEO BRONCHOSCOPY WITH FLUORO;  Surgeon: Rigoberto Noel, MD;  Location: [facility];  Service: Cardiopulmonary;  Laterality: Bilateral;    No Known Allergies  Current Outpatient Medications  Medication Sig Dispense Refill   aspirin EC 81 MG tablet Take 1 tablet (81 mg total) by mouth daily. 90 tablet 3   Cholecalciferol (VITAMIN D) 50 MCG (2000 UT) CAPS Take 2,000 Units by mouth daily.      ezetimibe (ZETIA)  10 MG tablet TAKE (1) TABLET DAILY AT BEDTIME. 90 tablet 3   finasteride (PROSCAR) 5 MG tablet Take 5 mg by mouth every evening.      metoprolol succinate (TOPROL-XL) 25 MG 24 hr tablet Take 0.5 tablets (12.5 mg total) by mouth every other day. 23 tablet 3   potassium citrate (UROCIT-K) 10 MEQ (1080 MG) SR tablet Take 10 mEq by mouth daily.     rosuvastatin (CRESTOR) 40 MG tablet Take 40 mg by mouth daily.     No current facility-administered medications for this visit.    Social History   Socioeconomic History   Marital status: Married     Spouse name: Not on file   Number of children: Not on file   Years of education: Not on file   Highest education level: Not on file  Occupational History   Not on file  Tobacco Use   Smoking status: Former    Types: Pipe    Quit date: 05/26/1972    Years since quitting: 50.0   Smokeless tobacco: Never   Tobacco comments:    Quit 43 year  Substance and Sexual Activity   Alcohol use: Yes    Alcohol/week: 4.0 standard drinks of alcohol    Types: 4 Standard drinks or equivalent per week   Drug use: No   Sexual activity: Not on file  Other Topics Concern   Not on file  Social History Narrative   Not on file   Social Determinants of Health   Financial Resource Strain: Not on file  Food Insecurity: Not on file  Transportation Needs: Not on file  Physical Activity: Not on file  Stress: Not on file  Social Connections: Not on file  Intimate Partner Violence: Not on file   Socially he is married. He  9 grandchildren. One son lives in [place], a daughter lives in [place]. There is no tobacco use. He does take occasional alcohol. He does exercise.  Family History  Problem Relation Age of Onset   Ovarian cancer Mother    Heart attack Father 100    ROS General: Negative; No fevers, chills, or night sweats;  HEENT: Negative; No changes in vision or hearing, sinus congestion, difficulty swallowing Pulmonary: Negative; No cough, wheezing, shortness of breath, hemoptysis Cardiovascular: See HPI GI: Negative; No nausea, vomiting, diarrhea, or abdominal pain GU: Negative; No dysuria, hematuria, or difficulty voiding Musculoskeletal: Recent bilateral nasal bone fractures and fracture of right zygomatic arch Hematologic/Oncology: Negative; no easy bruising, bleeding Remote history of sarcoidosis, completely resolved Endocrine: Negative; no heat/cold intolerance; no diabetes Neuro: Amnesic to his syncopal spell Skin: Negative; No rashes or skin lesions Psychiatric: Negative; No  behavioral problems, depression Sleep: Negative; No snoring, daytime sleepiness, hypersomnolence, bruxism, restless legs, hypnogognic hallucinations, no cataplexy Other comprehensive 14 point system review is negative.   PE BP 124/74 (BP Location: Left Arm, Patient Position: Sitting, Cuff Size: Normal)   Pulse (!) 55   Ht 5' 9" (1.753 m)   Wt 194 lb (88 kg)   SpO2 95%   BMI 28.65 kg/m    Repeat blood pressure by me was 128/78.  Wt Readings from Last 3 Encounters:  05/09/22 194 lb (88 kg)  11/19/21 194 lb 6.4 oz (88.2 kg)  01/24/21 191 lb 3.2 oz (86.7 kg)   General: Alert, oriented, no distress.  Skin: normal turgor, no rashes, warm and dry HEENT: Normocephalic, atraumatic. Pupils equal round and reactive to light; sclera anicteric; extraocular muscles intact;  Nose without nasal  septal hypertrophy Mouth/Parynx benign; Mallinpatti scale 2 Neck: No JVD, no carotid bruits; normal carotid upstroke Lungs: clear to ausculatation and percussion; no wheezing or rales Chest wall: without tenderness to palpitation Heart: PMI not displaced, RRR, s1 s2 normal, 1/6 systolic murmur in the aortic area, no diastolic murmur, no rubs, gallops, thrills, or heaves Abdomen: soft, nontender; no hepatosplenomehaly, BS+; abdominal aorta nontender and not dilated by palpation. Back: no CVA tenderness Pulses 2+ Musculoskeletal: full range of motion, normal strength, no joint deformities Extremities: no clubbing cyanosis or edema, Homan's sign negative  Neurologic: grossly nonfocal; Cranial nerves grossly wnl Psychologic: Normal mood and affect    May 09, 2022 ECG (independently read by me):  Sinus bradycardia at 57, :AD, RBBB, LVH   November 19, 2021 ECG (independently read by me): Sinus bradycardia at 54, LAD, RBBB, LVH  January 24, 2021 ECG (independently read by me): Sinus bradycardia at 52; RBBB with repolarization, LVH  August 15, 2020 ECG (independently read by me): Sinus bradycardia at 57,  RBBB with repolarization changes  September 2021 ECG (independently read by me): Sinus bradycardia at 56, RBBB with repolarization, LVH by voltage; normal intervals, no ectopy  February 12, 20212 ECG (independently read by me): Sinus bradycardia 58 bpm, right bundle branch block with repolarization changes.  Borderline criteria for LVH.  No ectopy.  QTc interval 447 ms  December 2020 ECG (independently read by me): Sinus bradycardia at 54 bpm, right bundle branch block with repolarization changes.  Left axis deviation.  Borderline voltage criteria for LVH  April 08, 2019 ECG (independently read by me): Sinus rhythm at 65 bpm, left axis deviation, right bundle branch block with repolarization changes.  LVH by voltage.  QTc interval 453 ms.  PR interval 146 ms.  September 2020 ECG (independently read by me): Sinus bradycardia at 53 bpm, right bundle branch block with repolarization changes.  QRS duration 156 ms.  Mild LVH by voltage in aVL.  QTc interval 439 ms  March 2019 ECG (independently read by me): normal sinus rhythm at 65 bpm.  One isolated PVC.  Incomplete right bundle branch block.  Borderline LVH.  Normal intervals.  February 2018 ECG (independently read by me): Sinus bradycardia at 56 bpm.  Mild sinus arrhythmia.  Mild LVH.  Normal intervals.  No ST segment changes.  February 2017 ECG (independently read by me):  Normal sinus rhythm at 63 bpm.  Early transition.  No ST segment changes.  December 2015 ECG (independently read by me): Normal sinus rhythm at 66 bpm.  Mild RV conduction delay.  Early transition.  December 2014 ECG: Sinus rhythm with an occasional PVC; normal intervals.  LABS: I personally reviewed the laboratory from [place] done on 02/19/2016.  BUN 18, creatinine 0.9.  Ingrown 14.7, hematocrit 44.9.  Normal LFTs.  TSH 2.08.  April lipoprotein B 66.  Normal PSA.  Lipid studies as noted above.  Lpid studies from March 03, 2017: Total cholesterol  134, HDL 39, LDL 70, triglycerides 124.     After he left the office I was able to obtain accurate from Dr. Sandi Mariscal from his blood work on Eugene [name] from December 2021: Cholesterol 102, HDL 45, triglycerides 65, and LDL 43. Hypoprotein particle analysis small LDL particles excellent at 281. CMP normal. CBC normal. A1c 5.8. Vitamin D 26.3.     Latest Ref Rng & Units 02/15/2021    8:12 AM 06/30/2019    8:59 AM 04/28/2019    2:20 PM  BMP  Glucose 65 - 99 mg/dL 83  88  106   BUN 8 - 27 mg/dL _0 Creatinine 0.76 - 1.27 mg/dL 0.98  1.03  0.91   BUN/Creat Ratio 10 - _1 Sodium 134 - 144 mmol/L 139  138  139   Potassium 3.5 - 5.2 mmol/L 5.0  4.8  4.9   Chloride 96 - 106 mmol/L 101  103  102   CO2 20 - 29 mmol/L _2 Calcium 8.6 - 10.2 mg/dL 10.1  10.0  10.0       Latest Ref Rng & Units 02/15/2021    8:12 AM 06/30/2019    8:59 AM 03/20/2015    8:57 AM  Hepatic Function  Total Protein 6.0 - 8.5 g/dL 6.6  6.6  6.6   Albumin 3.6 - 4.6 g/dL 4.3  4.3  4.0   AST 0 - 40 IU/L 33  45  29   ALT 0 - 44 IU/L 44  54  30   Alk Phosphatase 44 - 121 IU/L 77  73  68   Total Bilirubin 0.0 - 1.2 mg/dL 0.8  0.7  0.8       Latest Ref Rng & Units 02/15/2021    8:12 AM 06/30/2019    8:59 AM 04/28/2019    2:20 PM  CBC  WBC 3.4 - 10.8 x10E3/uL 8.2  6.7  9.5   Hemoglobin 13.0 - 17.7 g/dL 14.3  14.1  14.5   Hematocrit 37.5 - 51.0 % 43.4  43.6  44.1   Platelets 150 - 450 x10E3/uL 149  146  187    Lab Results  Component Value Date   MCV 92 02/15/2021   MCV 92 06/30/2019   MCV 91 04/28/2019   Additional studies reviewed: I reviewed all the records from [facility] including his CT images of January 25, 2019 following his recent syncopal spell induced  Trauma.  ECHO 7//28/2020 IMPRESSIONS  1. The left ventricle has normal systolic function with an ejection fraction of 60-65%. The cavity size was normal. There is moderate asymmetric left ventricular hypertrophy.  Left ventricular diastolic Doppler parameters are consistent with  pseudonormalization.  2. The right ventricle has normal systolic function. The cavity was normal. There is no increase in right ventricular wall thickness. Right ventricular systolic pressure could not be assessed.  3. Left atrial size was mildly dilated.  4. The aortic valve is abnormal. Moderate calcification of the aortic valve. Aortic valve regurgitation is trivial by color flow Doppler. Mild stenosis with a mean systolic gradient of 11 mmHg of the aortic valve.  5. The aorta is normal in size and structure when indexed to body surface area (ascending aorta measures 38 mm).  CARDIAC MRI 02/11/2019 IMPRESSION: 1. Normal left ventricular size with mild LV hypertrophy. This appears concentric and not asymmetric. EF 60%, normal wall motion.   2. The RV is mildly dilated with mild to moderate hypokinesis, EF 35%.   3. There is no definite myocardial LGE, so no definitive evidence for prior MI, infiltrative disease, or myocarditis.   Abnormal right ventricle. However, there is no definitive evidence for cardiac sarcoidosis or hypertrophic cardiomyoapthy.  ----------------------------------------------------------------------------------------------------------- CTA CORONARY :  IMPRESSION: 03/07/2019  1. Coronary calcium score of 0. This was 0 percentile for age and sex matched control.   2. Normal coronary origin with right dominance.   3. No evidence of CAD.  Eugene [name]  ADDENDUM/ Edited Result :  CTA  Aorta: Normal size. Calcifications of the aortic root and ascending aorta. No dissection.   Aortic Valve:  Trileaflet.  Moderate calcifications.   Coronary Arteries:  Normal coronary origin.  Left dominance.   RCA is a small non dominant artery that gives rise to PDA and PLVB. There is mild calcified plaque in the proximal RCA with associated stenosis of 25-49%. There is motion artifact in the mid and  distal RCA.   Left main is a large artery that gives rise to LAD and LCX arteries. There is moderate calcified plaque in the mid to distal LM with associated 50-69% stenosis.   LAD is a large vessel that gives rise to a large D1 and moderate sized D2. There is moderate calcified plaque in the proximal LAD with associated 50-69% stenosis. There is severe calcified plaque in the mid LAD with associated stenosis of 70-99%.   LCX is a large dominant artery that gives rise to one large OM1 branch. There is minimal calcified plaque in the proximal to mid LCx with associated stenosis of 0-24%. There is mild calcified plaque in the mid LCx at the takeoff of a moderate sized branching OM with associated stenosis of 25-49%. There is minimal atherosclerosis of the distal LCx with associated stenosis of 0 - 24%.   Other findings:   Normal pulmonary vein drainage into the left atrium.   Normal let atrial appendage without a thrombus.   Normal size of the pulmonary artery.   IMPRESSION: 1. Coronary calcium score of 1151. This was 66th percentile for age and sex matched control.   2.  Normal coronary origin with left dominance.   3.  Moderate to severe calcified AV cusps.   4.  Atherosclerosis of the aortic root and ascending aorta.   5. Moderate Calcified plaque in the distal LM and severe calcified plaque in the mid LAD. CAD-RADS 4b. Significant blooming may over estimate LAD stenosis.   6.  Recommend aggressive risk factor modification.   7.  Recommend cardiac catheterization.   8.  Study has been sent for [facility] analysis.   Traci Turner  -----------------------------------------------------------------------------------------  FFR FINDINGS: FFRCT analysis was performed on the original cardiac CT angiogram dataset. Diagrammatic representation of the FFRct analysis is provided in a separate PDF document in PACS. This dictation was created using the PDF document and an  interactive 3D model of the results. 3D model is not available in the EMR/PACS. Normal FFR range is >0.80.   1. No significant flow limiting lesion: LM FFR = 0.99.   2. LAD: No significant flow limiting lesion: Proximal FFR = 0.98, Mid FFR = 0.96, Distal FFR = 0.89.   3. LCX: No significant flow limiting lesion: Proximal FFR = 0.97, Mid FFR = 0.94, Distal FFR = 0.91.   4. RCA: No flow limiting lesion in non dominant small RCA: Proximal FFR = 1.00, Mid FFR = 0.97, distal FFR not obtained.   IMPRESSION: 1. CT FFR analysis show no hemodynamically significant flow limiting lesions.   ------------------------------------------------------------------------------------------- CARDIAC CATH 05/02/2019 Mid LM to Dist LM lesion is 30% stenosed. Prox LAD lesion is 30% stenosed. Mid LAD lesion is 20% stenosed. Dist LAD lesion is 40% stenosed.   There is evidence for mild to moderate coronary calcification without high-grade obstructive disease.  There is smooth distal tapering of the left main with 30% narrowing; 30% proximal and 20% mid ossified stenoses in the LAD with 40% diffuse stenosis in  a small caliber mid LAD vessel.  The left circumflex vessel is an angiographically normal dominant vessel; the RCA is normal nondominant vessel.  There is a small caliber ramus intermediate branch which appears to fill directly into the left ventricle resulting in ventricular opacification,   Mild transvalvular aortic gradient at 10 to 3mHg going from the left ventricle to the aorta consistent with his known very mild aortic valve stenosis.  There is mild aortic valve calcification.   RECOMMENDATION:  The patient will continue on aspirin therapy as well as his low-dose beta-blocker therapy.  A loop recorder will be placed in the short stay following his catheterization with careful close monitoring of his rhythm status.  Continue aggressive lipid-lowering therapy with target LDL ideally in the 50s or  below.  The patient was recently switched from simvastatin to rosuvastatin 40 mg.       LOOP RECORDER DEVICE CHECK: 06/08/2019 Carelink summary report received. Battery status OK. Normal device function. No new symptom episodes, tachy episodes, brady, or pause episodes. No new AF episodes. Monthly summary reports and ROV/PRN  ECHO 07/05/2019 IMPRESSIONS   1. Left ventricular ejection fraction, by estimation, is 65 to 70%. The  left ventricle has hyperdynamic function. The left ventrical has no  regional wall motion abnormalities. Left ventricular diastolic parameters  are consistent with Grade I diastolic  dysfunction (impaired relaxation).   2. Right ventricular systolic function is normal. The right ventricular  size is mildly enlarged. There is moderately elevated pulmonary artery  systolic pressure. The estimated right ventricular systolic pressure is  429.5mmHg.   3. No evidence of mitral valve regurgitation.   4. Tricuspid valve regurgitation is moderate.   5. Decreased motion of left and non coronary cusp. The aortic valve is  tricuspid. Aortic valve regurgitation is moderate. Mild aortic valve  stenosis. Aortic valve area, by VTI measures 1.78 cm. Aortic valve mean  gradient measures 10.5 mmHg. Aortic  valve Vmax measures 2.17 m/s.   6. Aortic dilatation noted. 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   7. The inferior vena cava is normal in size with greater than 50%  respiratory variability, suggesting right atrial pressure of 3 mmHg.   Comparison(s): No significant change from prior study. Prior images  reviewed side by side.    ECHO 08/09/2020: IMPRESSIONS   1. Left ventricular ejection fraction, by estimation, is 60 to 65%. The  left ventricle has normal function. The left ventricle has no regional  wall motion abnormalities. There is mild asymmetric left ventricular  hypertrophy of the basal-septal segment.  Left ventricular diastolic  parameters are consistent with Grade I  diastolic dysfunction (impaired relaxation).   2. Right ventricular systolic function is normal. The right ventricular  size is mildly enlarged.   3. The mitral valve is grossly normal. No evidence of mitral valve  regurgitation. No evidence of mitral stenosis.   4. The aortic valve is tricuspid. There is moderate calcification of the  aortic valve. There is moderate thickening of the aortic valve. Aortic  valve regurgitation is mild. Mild aortic valve stenosis. Aortic valve  area, by VTI measures 3.07 cm. Aortic  valve mean gradient measures 10.0 mmHg. Aortic valve Vmax measures 2.14  m/s.   Comparison(s): No significant change from prior study. LV function  unchanged. Aortic stenosis remains mild. Mild AI on this study.    IMPRESSION:  1. CAD in native artery   2. Mild aortic stenosis   3. Hyperlipidemia  with target LDL less than 70   4. NSVT (nonsustained ventricular tachycardia) (HCC)    ASSESSMENT AND PLAN: Mr. Finken is an active 88 year old gentleman who has a remote history of prior sarcoid disease with ultimate complete remisson.  He has a history of mild hyperlipidemia which has been aggressively controlled.  Remotely, he was found to have mild coronary calcification on CT imaging.  I have been aggressive with lipid therapy in attempt to potentially induce plaque regression and previously he has been on Zetia/simvastatin 10/40 with LDL cholesterols previously documented to be 70.  In 2016 he was found to have aortic stenosis which was mild with mild aortic insufficiency with a mean gradient of 9 and peak gradient of 18.  Around Labor Day in 2020 while [place] the beach [place] on a hot day he had a syncopal spell.  He has no recollection of falling and when he awakened he was in [facility] emergency room.  It does not appear that he had any significant awareness of rhythm disturbance and apparently when EMS arrived he must have had 6  stable cardiac rhythm.  In July 2020  a follow-up echo Doppler study confirmed just mild aortic stenosis with a valve area of 1.7 cm.  It is unlikely that the severity of his aortic stenosis accounts for his syncope.  Recently, his ECG showed definite right bundle branch block which in the past had just been incomplete right bundle.  On his ECG of February 14, 2019 he was in sinus rhythm, although bradycardic at 53 with right bundle branch block.  QTc interval was normal.  I was concerned potential ischemic etiology scheduled [name] to undergo a coronary CTA for further evaluation of potential significant coronary obstructive disease. I  also scheduling [name] for carotid duplex imaging to assess his carotid arteries and vertebral flow.  His Zio patch findings demonstrated predominant sinus rhythm but revealed several episodes of supraventricular tachycardia, as well as episodes of isolated ectopic in isolated ventricular ectopy.  His fastest heartbeat was 190 bpm which lasted 4 beats and was consistent with SVT.  The longest SVT episode was 12.8 seconds with an average rate of 138 bpm. 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  Definitive cardiac catheterization demonstrated mild to moderate coronary calcification with nonobstructive plaque in his left main and LAD of approximately 30%. He continues to remain asymptomatic and is on a more aggressive treatment to attempt plaque regression rosuvastatin 40 mg and Zetia 10 mg.  Lipid studies in February 2021 showed an LDL of 30.  Laboratory by Dr. Sandi Mariscal in December 2021 continued to show excellent labs with LDL cholesterol at 43, triglycerides 65 HDL 45 and total cholesterol 102.  LDL particle number was low at 732.  I have continued to be aggressive with lipid-lowering therapy and he is tolerating Zetia 10 mg and rosuvastatin 40 mg combination.  His most recent echo Doppler study in March 2022 essentially was  unchanged from his prior evaluation and continued to show an EF of 60 to 02%, grade 1 diastolic dysfunction, mild asymmetric LVH at the basal septum and moderate calcification of a trileaflet aortic valve with mild aortic stenosis (mean gradient 10 and peak gradient 18 mmHg).  Laboratory from September 2022 continue to be excellent with LDL cholesterol of 38 and attempt to induce plaque regression. He has tolerated rosuvastatin 40 mg and Zetia 10 mg.  Presently, his blood pressure is stable.  He is asymptomatic without chest pain, shortness of breath, or awareness of any palpitations.  His most recent implantable loop recorder download revealed a brief episode on December 2 at 6:25 PM which lasted for 15 seconds of self-limiting NSVT at a rate of 162.  He was completely asymptomatic and does not recall any sense of palpitations.  I reviewed his loop recorder results with Dr. Sallyanne Kuster.  With his bradycardia on ECG I will keep his current dose of metoprolol succinate at 12.5 mg which he takes every other day.  He continues to tolerate Zetia 10 mg and rosuvastatin 40 mg and is on aspirin 81 mg with his significant elevated calcium score and evidence for CAD at cath.  We discussed he can reduce this to every other day if there are issues with bleeding.  He will send me his laboratory that was done by Dr. Sandi Mariscal earlier this year.  I will see [name] in 6 months for reevaluation or sooner as needed.    His blood pressure today is stable without orthostatic change.  His resting pulse is 54.  I have suggested he try reducing metoprolol to 12.5 mg every other day rather than daily.  I have recommended continuation of 81 mg aspirin in light of his CAD and tolerability.  I reviewed his most recent loop recorder analysis and this has been stable without arrhythmia.  I will see [name] in 6 months for reevaluation   Troy Sine, MD, [facility]  05/15/2022 2:41 PM

## 2022-05-15 ENCOUNTER — Encounter: Payer: Self-pay | Admitting: Cardiovascular Disease

## 2022-05-25 NOTE — Telephone Encounter (Signed)
Reviewed with patient

## 2022-06-05 ENCOUNTER — Ambulatory Visit (INDEPENDENT_AMBULATORY_CARE_PROVIDER_SITE_OTHER): Payer: Medicare Other

## 2022-06-05 DIAGNOSIS — R55 Syncope and collapse: Secondary | ICD-10-CM

## 2022-06-05 LAB — CUP PACEART REMOTE DEVICE CHECK
Date Time Interrogation Session: 20240110231920
Implantable Pulse Generator Implant Date: 20201207

## 2022-06-12 NOTE — Progress Notes (Signed)
Carelink Summary Report / Loop Recorder

## 2022-06-13 ENCOUNTER — Other Ambulatory Visit: Payer: Self-pay | Admitting: Family Medicine

## 2022-06-13 ENCOUNTER — Ambulatory Visit
Admission: RE | Admit: 2022-06-13 | Discharge: 2022-06-13 | Disposition: A | Discharge status: Home / Self Care | Payer: Medicare Other | Source: Ambulatory Visit | Attending: Family Medicine | Admitting: Family Medicine

## 2022-06-13 DIAGNOSIS — R0602 Shortness of breath: Secondary | ICD-10-CM

## 2022-06-16 ENCOUNTER — Telehealth: Payer: Self-pay

## 2022-06-16 NOTE — Telephone Encounter (Signed)
LINQ alert received. 1 new AF event, 56mn in duration, mean HR 98 Burden 0.1%, no OAC, ASA only Route to triage LA  ILR implanted for syncope/no prior hx of afib.  56 mins of AF event triggered on ILR around 5:57am on 06/15/22. Patient states he feels fine and was asleep. No symptoms to note.  He is taking all of his medications as prescribed. No new meds or OTC meds to report.  No changes to diet, health or activity level.  Will follow up with patient after review with Dr. CSallyanne Kusterfor next steps.

## 2022-06-17 ENCOUNTER — Other Ambulatory Visit: Payer: Self-pay | Admitting: Family Medicine

## 2022-06-17 DIAGNOSIS — R270 Ataxia, unspecified: Secondary | ICD-10-CM

## 2022-06-17 NOTE — Telephone Encounter (Signed)
Will arrange f/u w Dr. Claiborne Billings to discuss possible anticoagulation.

## 2022-06-20 ENCOUNTER — Other Ambulatory Visit: Payer: Medicare Other

## 2022-06-23 NOTE — Telephone Encounter (Signed)
Agree with need for ov discussion concerning anti-coagulation

## 2022-06-25 ENCOUNTER — Ambulatory Visit
Admission: RE | Admit: 2022-06-25 | Discharge: 2022-06-25 | Disposition: A | Discharge status: Home / Self Care | Payer: Medicare Other | Source: Ambulatory Visit | Attending: Family Medicine | Admitting: Family Medicine

## 2022-06-25 DIAGNOSIS — R270 Ataxia, unspecified: Secondary | ICD-10-CM

## 2022-06-25 MED ORDER — GADOPICLENOL 0.5 MMOL/ML IV SOLN
9.0000 mL | Freq: Once | INTRAVENOUS | Status: AC | PRN
  Administered 2022-06-25: 9 mL via INTRAVENOUS

## 2022-06-25 NOTE — Progress Notes (Signed)
Carelink Summary Report / Loop Recorder

## 2022-06-27 NOTE — Telephone Encounter (Signed)
Patient was called by the scheduling team, scheduled for follow up on 02/14.  Will review starting medication at this visit.  Thanks!

## 2022-07-07 ENCOUNTER — Ambulatory Visit: Payer: Medicare Other

## 2022-07-07 DIAGNOSIS — R55 Syncope and collapse: Secondary | ICD-10-CM | POA: Diagnosis not present

## 2022-07-08 LAB — CUP PACEART REMOTE DEVICE CHECK
Date Time Interrogation Session: 20240212232315
Implantable Pulse Generator Implant Date: 20201207

## 2022-07-09 ENCOUNTER — Ambulatory Visit: Payer: Medicare Other | Attending: Cardiovascular Disease | Admitting: Cardiovascular Disease

## 2022-07-09 VITALS — BP 116/84 | HR 53 | Ht 69.0 in | Wt 185.0 lb

## 2022-07-09 DIAGNOSIS — Z7189 Other specified counseling: Secondary | ICD-10-CM | POA: Diagnosis present

## 2022-07-09 DIAGNOSIS — R0683 Snoring: Secondary | ICD-10-CM | POA: Diagnosis present

## 2022-07-09 DIAGNOSIS — E785 Hyperlipidemia, unspecified: Secondary | ICD-10-CM

## 2022-07-09 DIAGNOSIS — I480 Paroxysmal atrial fibrillation: Secondary | ICD-10-CM | POA: Insufficient documentation

## 2022-07-09 DIAGNOSIS — R7989 Other specified abnormal findings of blood chemistry: Secondary | ICD-10-CM | POA: Insufficient documentation

## 2022-07-09 DIAGNOSIS — I4729 Other ventricular tachycardia: Secondary | ICD-10-CM | POA: Insufficient documentation

## 2022-07-09 DIAGNOSIS — I452 Bifascicular block: Secondary | ICD-10-CM | POA: Diagnosis present

## 2022-07-09 DIAGNOSIS — I350 Nonrheumatic aortic (valve) stenosis: Secondary | ICD-10-CM | POA: Diagnosis not present

## 2022-07-09 DIAGNOSIS — I2510 Atherosclerotic heart disease of native coronary artery without angina pectoris: Secondary | ICD-10-CM | POA: Insufficient documentation

## 2022-07-09 DIAGNOSIS — I4891 Unspecified atrial fibrillation: Secondary | ICD-10-CM

## 2022-07-09 MED ORDER — APIXABAN 5 MG PO TABS: 5.0000 mg | ORAL_TABLET | Freq: Two times a day (BID) | ORAL | 3 refills | Status: DC

## 2022-07-09 NOTE — Patient Instructions (Signed)
Medication Instructions:  START Eliquis 5 mg twice daily   *If you need a refill on your cardiac medications before your next appointment, please call your pharmacy*   Testing/Procedures: Your physician has recommended that you have a sleep study. This test records several body functions during sleep, including: brain activity, eye movement, oxygen and carbon dioxide blood levels, heart rate and rhythm, breathing rate and rhythm, the flow of air through your mouth and nose, snoring, body muscle movements, and chest and belly movement.   Follow-Up: At [facility], you and your health needs are our priority.  As part of our continuing mission to provide you with exceptional heart care, we have created designated Provider Care Teams.  These Care Teams include your primary Cardiologist (physician) and Advanced Practice Providers (APPs -  Physician Assistants and Nurse Practitioners) who all work together to provide you with the care you need, when you need it.  We recommend signing up for the patient portal called "MyChart".  Sign up information is provided on this After Visit Summary.  MyChart is used to connect with patients for Virtual Visits (Telemedicine).  Patients are able to view lab/test results, encounter notes, upcoming appointments, etc.  Non-urgent messages can be sent to your provider as well.   To learn more about what you can do with MyChart, go to [email or web address].    Your next appointment:   3 month(s)  Provider:   Shelva Majestic, MD

## 2022-07-09 NOTE — Progress Notes (Signed)
Patient ID: Eugene [name], male   DOB: 1933-03-08, 89 y.o.   MRN: [id number]     Primary MD: Dr. Derinda Late  HPI: Eugene [name] is a 89 y.o. male who presents to the office today for a 2 month cardiology evaluation after developing an episode of atrial fibrillation detected on his loop recorder.  Eugene [name] has a remote history of sarcoidosis and had been followed at [facility] with complete remission. He has a history of mild hyperlipidemia and has been on Vytorin 10/40. He also has a history of BPH on Proscar.  An echo Doppler study which was done to evaluate a systolic murmur in the aortic region noted in December 2013 showed normal systolic function with mild focal basal hypertrophy of the septum. He had normal systolic function. There was evidence for a moderate diffuse calcification involving the noncoronary cusp of a trileaflet aortic valve. He did have sclerosis without stenosis. Valve area was 2.55 cm. RV size was upper normal to mildly dilated. A nuclear perfusion study done in 2013 was unchanged from 6 years previously and continued to show normal perfusion.  Laboratory done by Dr. Deforest Hoyles in August 2015 was normal with a hemoglobin of 14.5, hematocrit 43.8.  Renal function was excellent with a BUN of 19, Cr 0.99.  Fasting glucose was 78.  Lipid studies remained excellent with a total cholesterol 125, triglycerides 14, HDL cholesterol 44, and LDL cholesterol at 60.  Angiotensin converting enzyme was normal at 37 and last year was 33.  He underwent an echo Doppler study in 03/13/2015.  This revealed an ejection fraction at 60-65%. There was grade 1 diastolic dysfunction.  He had normal LV filling pressures.  His aortic valve was calcified and there was evidence for mild aortic stenosis with a mean gradient of 9, peak gradient of 18, and valve area of 1.87 cm and mild aortic insufficiency.  There was mild mitral annular calcification with trivial MR , mild LA  dilatation, and moderate TR with PA pressure 30 mm. He underwent right knee replacement surgery by Dr. Moshe Salisbury in November.  Postoperatively had some swelling and lower extremity venous studies were negative.  When I saw him in 2017, he was doing well from a cardiac standpoint.   Specifically, he denied any episodes of chest pain.  He was able to play tennis again following his knee surgery.  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  He had also undergone follow-up CT imaging asked and was found to have a new irregularly-shaped nodular density in the left lower lobe, alt to represent a possible focus of active infection/inflammation.  A follow-up CT was recommended.  After a course of antimicrobial therapy.  His CT also demonstrated a stable ectatic.  Borderline aneurysmal ascending thoracic aorta at 3.9 cm.  He had evidence for coronary calcification.   Laboratory in 2017 by his primary physician showed total cholesterol was 134, triglycerides 79, HDL 43, and LDL 75 on his current dose of ezetimibe 10 mg and simvastatin 40 mg.   When I saw him in March 2019 he was remaining active and was walking on elliptical machine at least 3-4 times per week and was playing tennis 2 times per week.  He denied any chest pain or shortness of breath.  He denies palpitations.   In 2019, he was  found to have a pulmonary infiltrate for which she was evaluated by Dr. Elsworth Soho.  He had undergone bronchoscopy with biopsy and ultimately underwent an evaluation at [facility] and this nodule had not significantly changed over the past 16 years.  I evaluated him in May 2020 and a telemedicine visit.  At that time he denied any chest pain, palpitations, presyncope or syncope.  He denied any PND orthopnea.  He had previously been followed by Dr. Ardeth Perfect but he had recently signed up for  concierge medicine with Dr. Derinda Late.  During that evaluation I recommended he undergo a follow-up echo Doppler study to reassess his aortic valve since in 2016 he was found to have mild aortic stenosis.  He underwent an echo Doppler study on December 21, 2018 which continues to show normal systolic function with EF at 60 to 65%, and moderate asymmetric left ventricular hypertrophy.  There was grade 2 diastolic dysfunction.  There was mild aortic stenosis with moderate calcification of the aortic valve.  He had a mean systolic gradient of 11 mm and his peak gradient was 20.8 mm.  Calculated aortic valve was 1.7 cm and was consistent with mild aortic stenosis.  On January 25, 2019 while biking at [place] he apparently had a syncopal spell.  He was found by bystanders.  Apparently he is amnesic to the event and when he woke up he was at [facility] in the emergency room.  It did not appear that he was aware when he had fallen since there was no apparent marks on his hands to break a fall.  A CT scan of the maxillofacial area demonstrated a comminuted displaced bilateral nasal bone fracture as well as fracture of the right zygomatic arch.  There was a frontal scalp hematoma.  He had extensive CT imaging of his chest abdomen and pelvis.  Of note, on his chest CT he was felt to have severe coronary artery atherosclerotic calcifications.  There was no evidence for acute traumatic injury within the chest abdomen or pelvis.  He had a 3.6 cm indeterminate liver mass, 4.3 cm indeterminate left renal lesion and had multiple bladder stones and bladder diverticula likely related to chronic outlet obstruction from an enlarged prostate and had nonobstructive bilateral nephrolithiasis.  Upon his return to [facility], he was evaluated by Hulan Saas in the [place] primary care.  He subsequently was evaluated by Dr. Al Pimple in our office for cardiology evaluation during my absence.  With his  memory absence, it is felt that he had a concussion.  When seen by Dr. Harrell Gave, she reviewed his extensive evaluation and recent echo which confirmed that he did not have significant aortic stenosis.  Because of his remote history of sarcoidosis, she scheduled him to undergo cardiac MRI imaging to assess for scar/infiltrative disease.  He was also scheduled to undergo 14-day Ziotelemetry monitor.    I saw him in follow-up of Dr. Harrell Gave on February 14, 2019.  At the time of his evaluation he had not yet received his Zio patch monitor.  I reviewed extensively his records from [facility] as well as the records of Dr. Harrell Gave.  His MR scan was reviewed and did not show any definitive myocardial late gadolinium enhancement so there was no definitive evidence for prior myocardial infarction, infiltrative disease or myocarditis.  LVEF was approximately 60%.  His RV EF was reported at 35% however when reviewed by Dr. Harrell Gave she had felt in 20 function was somewhat better.  I scheduled him to undergo coronary CTA to assess potential CAD disease with silent ischemia leading to his clinical event.  He also underwent carotid duplex imaging.Marland Kitchen  His 14-day ZIO monitor showed predominant sinus rhythm with right bundle branch block.  He did have an average heart rate at 62 bpm with a minimum of 42 while sleeping and maximal heart rate of 190.  He had several short bursts of SVT the fastest interval lasting 4 beats with a maximum rate of 190.  The longest SVT episode was 12.8 seconds with an average heart rate of 138 bpm.  There was no VT or atrial fibrillation or high degree block or pauses noted.  Carotid duplex imaging essentially normal with normal antegrade vertebral and subclavian flow and was without evidence for internal carotid stenoses.  I received an initial report of his CT angiogram which came back as normal.  We had notified him of the results.  Apparently, there was an addendum to the  report port when it was reinterpreted adjusted multivessel CAD with a calcium score of 1151 with moderate calcific plaque in the mid distal left main, severe calcific plaque in the mid LAD mild calcific plaque in the circumflex and RCA.  There was concern of possible significant blooming artifact which may have contributed to overestimate of LAD stenoses.  Subsequent FFR analysis was normal and did not reveal any hemodynamic significant stenoses.   I had a long conversation with Eugene [name] as well as called his daughter in [place] in follow-up of his April 08, 2019 office visit.  At that time I recommended initiation of aspirin 81 mg and initiated metoprolol succinate 12.5 mg daily.  I also discussed changing him to more aggressive lipid-lowering therapy and he recently was started on rosuvastatin 40 mg in place of simvastatin. When I last saw him in the office on April 18, 2019 I recommended definitive cardiac catheterization in light of his coronary CT angio findings also recommended insertion of a loop monitor for potential long-term monitoring of potential cardiac arrhythmia.   Eugene [name] underwent cardiac catheterization on May 02, 2019 by me. There was mild to moderate coronary calcification without high-grade obstructive disease. There was a smooth distal tapering of his left main with 30% narrowing, 30% proximal and 20% mid calcified stenoses in the LAD with 40% diffuse stenosis in a small caliber mid LAD vessel. The left circumflex vessel was angiographically normal and dominant. The RCA was a normal nondominant vessel. Of note, there was a small caliber ramus intermediate branch which appeared to fill directly into the left ventricle resulting in left ventricular opacification. He had only very mild aortic stenosis with a transvalvular aortic gradient at 10 to 12 mm. There was mild aortic valve calcification. There that day before going home he underwent insertion of a loop recorder by Dr.  Sallyanne Kuster.  I saw him in follow-up of his catheterization and loop recorder on May 19, 2019.  Over the last several weeks, Eugene [name] has felt well. He denies any episodes of chest pain or shortness of breath. Loop recorder interrogation to date has not shown any events.   Since I saw him in December 2020, he has been undergoing monthly device checks of his loop recorder.  He has normal device function.  There are no new symptom episodes or episodes of tachycardia, bradycardia or pauses.  There were no new AF episodes.  He underwent a follow-up echo Doppler study on July 05, 2019.  This essentially is unchanged  from his prior evaluation and continues to show hyperdynamic LV function.  He has grade 1 diastolic dysfunction.  There is decreased motion of his left and noncoronary cusp of a trileaflet aortic valve.  Aortic stenosis is mild with a mean gradient of 10.5 and peak gradient of 18.8.  There is moderate aortic insufficiency.  Ascending aorta is mildly dilated at 40 mm.  There is moderate tricuspid regurgitation.  I saw him in February 2021 at which time he continued to feel well.  He specifically denied any chest pain, PND orthopnea, presyncope or syncope or awareness of palpitations.    He underwent recent laboratory now on his regimen of Zetia plus rosuvastatin 40 mg, and LDL cholesterol is now markedly reduced from 72 down to 30 with combination treatment.  There is minimally elevated AST and ALT levels of 45 and 5 respectively.  He remains asymptomatic.  I saw him on February 10, 2020.  He had seen Dr. Derinda Late who is his primary provider andunderwent recent laboratory which I was able to obtain today after his office visit and called him to discuss the findings with him.  Laboratory from January 03, 2020 was excellent.  LDL cholesterol was 36.  He underwent particle assessment and LDL particle number was optimal at 905 as was small LDL particles at 138.  April lipoprotein B was excellent  at 49 and LP(a) was excellent at 27.  He continues to be active and plays tennis and does elliptical.  He also rides his bike.  He denies any anginal symptoms.  He continues to have monthly loop recorder checks.  His most recent evaluation did not reveal any events.   He was asymptomatic and continued to be on low-dose metoprolol succinate 12.5 mg daily with stable blood pressure.  I saw him on August 15, 2020.  Since his prior evaluation he had a follow-up echo Doppler study on August 09, 2020.  This essentially was unchanged from previously and showed an EF of 60 to 65% with grade 1 diastolic dysfunction.  There was mild asymmetric LVH at of the basal septal segment.  There was moderate calcification of a trileaflet aortic valve with mild aortic stenosis.  His mean gradient was 10 mmHg with a peak gradient of 18.3.  He continued to be on Zetia and rosuvastatin 40 mg for hyperlipidemia and metoprolol succinate 12.5 mg. He denied any palpitations.  He continues to undergo remote loop recorder check at 37-monthintervals.  He has not had any clinically significant events and has normal battery status.   I saw him on January 24, 2021 and since his prior evaluation he continued to be stable.  He specifically denied presyncope or syncope or awareness of palpitations.  He denied any chest pain or exertional dyspnea.  His resting pulse is in the 50s and he remains asymptomatic.  He had undergone laboratory by Dr. PDerinda Latein December 2021.  Total cholesterol was 102, HDL 45, triglycerides 65, and LDL 43.  Renal function was stable with a creatinine of 1.1.  He continues to be active and rides a bike.  [place]  I reviewed his recent implantable loop recorder downloads.  He has not had any clinically significant events.   I saw him on November 19, 2021 at which time he remained stable.  He continued to play tennis and ride his bike.  He was unaware of any palpitations.  He  has not  had any arrhythmias noted on his loop recorder interrogations.  He will be traveling to [place] the [place] in September.  He continues to be on Zetia 10 mg and rosuvastatin 40 mg for aggressive lipid management.  In September 2022 LDL was 38 with total cholesterol 101 and triglycerides 77.  He tells me that Dr. Sandi Mariscal checked complete laboratory in January.  I do not have these results and will contact his office so that they can be sent to me for my review.  He continues to be on metoprolol succinate 12.5 mg daily and aspirin 81 mg with his CAD.    I last saw him on May 09, 2022 at which time he continued to be asymptomatic and was still playing tennis.   His most recent loop recorder download for the first time picked up a 15-second period of self-limiting nonsustained VT at a rate of 162 which occurred on December 2 at 6:25 PM.  He was completely asymptomatic and was  unaware of any tachycardia at the time.  He denies any presyncope or syncope.  He denies any chest pain.  He had laboratory by Dr. Sandi Mariscal.  During that evaluation, we had a lengthy discussion.  His ECG showed sinus bradycardia and he was taking metoprolol succinate 12.5 mg every other day with his resting heart rate at 57.  Since I last saw him, Eugene [name] has continued to be asymptomatic.  However, on his most recent loop recorder read out, he had experienced an episode of atrial fibrillation on January 21 at 5:56 AM.  He was unaware of this episode but apparently it lasted for 56 minutes and had bursts of increased heart rate up to 156 bpm with average rate of 98.  This occurred while he was sleeping in the early morning.  Since he takes his metoprolol on even days he was 23 hours since his last dose of 12.5 mg.  Upon questioning, his wife states that he does snore.  He has not been evaluated for atrial fibrillation.  He was contacted to come to the office to discuss potential need for anticoagulation initiation  and he is here with his wife today for further discussion and recommendations.  Past Medical History:  Diagnosis Date   Bladder stones    BPH (benign prostatic hyperplasia)    Hernia of abdominal cavity    History of kidney stones    Hyperlipidemia    Incomplete right bundle branch block    Low O2 saturation 04/04/2015   Wife says O2 sats are in the low 90s base line.  Now in the 18s.  Will do incentive spirometry   Primary localized osteoarthritis of right knee 04/02/2015   Sarcoidosis    diagnosed 8-9 yrs ago   Upper GI bleed 04/04/2015   Patient had coffee ground emesis that was Guiac positive on Monday.  EGD scheduled for 04/04/2015. Started on IV Protonix    Past Surgical History:  Procedure Laterality Date   ANTERIOR CERVICAL DECOMP/DISCECTOMY FUSION  2007   BRONCHOSCOPY  2007   CARDIOVASCULAR STRESS TEST  04/29/2012   normal nuclear stress study.    CYSTOSCOPY  2004   DOPPLER ECHOCARDIOGRAPHY  04/29/2012   EF not noted. small perimembranous ventricular septal defect. small left to right ventricular shunt. moderate diffuse calcification involving noncoronary cusp of the aortic valve.    ESOPHAGOGASTRODUODENOSCOPY  04/04/2015   ESOPHAGOGASTRODUODENOSCOPY N/A 04/04/2015   Procedure: ESOPHAGOGASTRODUODENOSCOPY (EGD);  Surgeon: Manus Gunning, MD;  Location: [facility]  ENDOSCOPY;  Service: Gastroenterology;  Laterality: N/A;   INGUINAL HERNIA REPAIR Right 2009   LEFT HEART CATH AND CORONARY ANGIOGRAPHY N/A 05/02/2019   Procedure: LEFT HEART CATH AND CORONARY ANGIOGRAPHY;  Surgeon: Troy Sine, MD;  Location: [place] [facility];  Service: Cardiovascular;  Laterality: N/A;   LOOP RECORDER INSERTION N/A 05/02/2019   Procedure: LOOP RECORDER INSERTION;  Surgeon: Sanda Klein, MD;  Location: [place] [facility];  Service: Cardiovascular;  Laterality: N/A;   TOTAL KNEE ARTHROPLASTY Right 04/02/2015   Procedure: TOTAL KNEE ARTHROPLASTY;  Surgeon: Elsie Saas, MD;  Location: [facility] [facility];   Service: Orthopedics;  Laterality: Right;   VIDEO BRONCHOSCOPY Bilateral 09/23/2017   Procedure: VIDEO BRONCHOSCOPY WITH FLUORO;  Surgeon: Rigoberto Noel, MD;  Location: [facility] ENDOSCOPY;  Service: Cardiopulmonary;  Laterality: Bilateral;    No Known Allergies  Current Outpatient Medications  Medication Sig Dispense Refill   apixaban (ELIQUIS) 5 MG TABS tablet Take 1 tablet (5 mg total) by mouth 2 (two) times daily. 60 tablet 3   Cholecalciferol (VITAMIN D) 50 MCG (2000 UT) CAPS Take 2,000 Units by mouth daily.      ezetimibe (ZETIA) 10 MG tablet TAKE (1) TABLET DAILY AT BEDTIME. 90 tablet 3   finasteride (PROSCAR) 5 MG tablet Take 5 mg by mouth every evening.      metoprolol succinate (TOPROL-XL) 25 MG 24 hr tablet Take 0.5 tablets (12.5 mg total) by mouth every other day. 23 tablet 3   potassium citrate (UROCIT-K) 10 MEQ (1080 MG) SR tablet Take 10 mEq by mouth daily.     rosuvastatin (CRESTOR) 40 MG tablet Take 40 mg by mouth daily.     No current facility-administered medications for this visit.    Social History   Socioeconomic History   Marital status: Married    Spouse name: Not on file   Number of children: Not on file   Years of education: Not on file   Highest education level: Not on file  Occupational History   Not on file  Tobacco Use   Smoking status: Former    Types: Pipe    Quit date: 05/26/1972    Years since quitting: 50.1   Smokeless tobacco: Never   Tobacco comments:    Quit 43 year  Substance and Sexual Activity   Alcohol use: Yes    Alcohol/week: 4.0 standard drinks of alcohol    Types: 4 Standard drinks or equivalent per week   Drug use: No   Sexual activity: Not on file  Other Topics Concern   Not on file  Social History Narrative   Not on file   Social Determinants of Health   Financial Resource Strain: Not on file  Food Insecurity: Not on file  Transportation Needs: Not on file  Physical Activity: Not on file  Stress: Not on file  Social  Connections: Not on file  Intimate Partner Violence: Not on file   Socially he is married. He  9 grandchildren. One son lives in [place], a daughter lives in [place]. There is no tobacco use. He does take occasional alcohol. He does exercise.  Family History  Problem Relation Age of Onset   Ovarian cancer Mother    Heart attack Father 100    ROS General: Negative; No fevers, chills, or night sweats;  HEENT: Negative; No changes in vision or hearing, sinus congestion, difficulty swallowing Pulmonary: Negative; No cough, wheezing, shortness of breath, hemoptysis Cardiovascular: See HPI GI: Negative; No nausea, vomiting, diarrhea, or  abdominal pain GU: Negative; No dysuria, hematuria, or difficulty voiding Musculoskeletal: Recent bilateral nasal bone fractures and fracture of right zygomatic arch Hematologic/Oncology: Negative; no easy bruising, bleeding Remote history of sarcoidosis, completely resolved Endocrine: Negative; no heat/cold intolerance; no diabetes Neuro: Amnesic to his syncopal spell Skin: Negative; No rashes or skin lesions Psychiatric: Negative; No behavioral problems, depression Sleep: Negative; No snoring, daytime sleepiness, hypersomnolence, bruxism, restless legs, hypnogognic hallucinations, no cataplexy Other comprehensive 14 point system review is negative.   PE BP 116/84   Pulse (!) 53   Ht 5' 9"$  (1.753 m)   Wt 185 lb (83.9 kg)   SpO2 92%   BMI 27.32 kg/m    Repeat blood pressure by me was 128/70  Wt Readings from Last 3 Encounters:  07/09/22 185 lb (83.9 kg)  05/09/22 194 lb (88 kg)  11/19/21 194 lb 6.4 oz (88.2 kg)   General: Alert, oriented, no distress.  Skin: normal turgor, no rashes, warm and dry HEENT: Normocephalic, atraumatic. Pupils equal round and reactive to light; sclera anicteric; extraocular muscles intact;  Nose without nasal septal hypertrophy Mouth/Parynx benign; Mallinpatti scale 2 Neck: No JVD, no carotid bruits; normal carotid  upstroke Lungs: clear to ausculatation and percussion; no wheezing or rales Chest wall: without tenderness to palpitation Heart: PMI not displaced, RRR, s1 s2 normal, 1/6 systolic murmur in the aortic area, no diastolic murmur, no rubs, gallops, thrills, or heaves Abdomen: soft, nontender; no hepatosplenomehaly, BS+; abdominal aorta nontender and not dilated by palpation. Back: no CVA tenderness Pulses 2+ Musculoskeletal: full range of motion, normal strength, no joint deformities Extremities: no clubbing cyanosis or edema, Homan's sign negative  Neurologic: grossly nonfocal; Cranial nerves grossly wnl Psychologic: Normal mood and affect  July 09, 2022 ECG (independently read by me): Sinus bradycardia at 50 bpm.  Right bundle branch block with repolarization changes.  Left axis deviation, voltage criteria for LVH.  PR interval 174 ms; QTc interval 437 ms.   May 09, 2022 ECG (independently read by me):  Sinus bradycardia at 57, :AD, RBBB, LVH   November 19, 2021 ECG (independently read by me): Sinus bradycardia at 54, LAD, RBBB, LVH  January 24, 2021 ECG (independently read by me): Sinus bradycardia at 52; RBBB with repolarization, LVH  August 15, 2020 ECG (independently read by me): Sinus bradycardia at 57, RBBB with repolarization changes  September 2021 ECG (independently read by me): Sinus bradycardia at 56, RBBB with repolarization, LVH by voltage; normal intervals, no ectopy  February 12, 20212 ECG (independently read by me): Sinus bradycardia 58 bpm, right bundle branch block with repolarization changes.  Borderline criteria for LVH.  No ectopy.  QTc interval 447 ms  December 2020 ECG (independently read by me): Sinus bradycardia at 54 bpm, right bundle branch block with repolarization changes.  Left axis deviation.  Borderline voltage criteria for LVH  April 08, 2019 ECG (independently read by me): Sinus rhythm at 65 bpm, left axis deviation, right bundle branch block with  repolarization changes.  LVH by voltage.  QTc interval 453 ms.  PR interval 146 ms.  September 2020 ECG (independently read by me): Sinus bradycardia at 53 bpm, right bundle branch block with repolarization changes.  QRS duration 156 ms.  Mild LVH by voltage in aVL.  QTc interval 439 ms  March 2019 ECG (independently read by me): normal sinus rhythm at 65 bpm.  One isolated PVC.  Incomplete right bundle branch block.  Borderline LVH.  Normal intervals.  February 2018 ECG (independently  read by me): Sinus bradycardia at 56 bpm.  Mild sinus arrhythmia.  Mild LVH.  Normal intervals.  No ST segment changes.  February 2017 ECG (independently read by me):  Normal sinus rhythm at 63 bpm.  Early transition.  No ST segment changes.  December 2015 ECG (independently read by me): Normal sinus rhythm at 66 bpm.  Mild RV conduction delay.  Early transition.  December 2014 ECG: Sinus rhythm with an occasional PVC; normal intervals.  LABS: I personally reviewed the laboratory from [place] done on 02/19/2016.  BUN 18, creatinine 0.9.  Ingrown 14.7, hematocrit 44.9.  Normal LFTs.  TSH 2.08.  April lipoprotein B 66.  Normal PSA.  Lipid studies as noted above.  Lpid studies from March 03, 2017: Total cholesterol 134, HDL 39, LDL 70, triglycerides 124.     After he left the office I was able to obtain accurate from Dr. Sandi Mariscal from his blood work on Eugene [name] from December 2021: Cholesterol 102, HDL 45, triglycerides 65, and LDL 43. Hypoprotein particle analysis small LDL particles excellent at 281. CMP normal. CBC normal. A1c 5.8. Vitamin D 26.3.   I reviewed recent laboratory from Dr. Sandi Mariscal on June 03, 2022: Potassium 4.4.  Sodium 138.  Creatinine 1.13.  Mild transaminase elevation with ALT at 66 and AST at 56.  TSH 2.58.  Hemoglobin 14.3/hematocrit 44.8.  Platelets 167 K.  Lipid studies show total cholesterol 84, HDL 36, triglycerides 84, LDL 32.     Latest Ref Rng & Units  02/15/2021    8:12 AM 06/30/2019    8:59 AM 04/28/2019    2:20 PM  BMP  Glucose 65 - 99 mg/dL 83  88  106   BUN 8 - 27 mg/dL 19  16  21   $ Creatinine 0.76 - 1.27 mg/dL 0.98  1.03  0.91   BUN/Creat Ratio 10 - 24 19  16  23   $ Sodium 134 - 144 mmol/L 139  138  139   Potassium 3.5 - 5.2 mmol/L 5.0  4.8  4.9   Chloride 96 - 106 mmol/L 101  103  102   CO2 20 - 29 mmol/L 24  23  27   $ Calcium 8.6 - 10.2 mg/dL 10.1  10.0  10.0       Latest Ref Rng & Units 02/15/2021    8:12 AM 06/30/2019    8:59 AM 03/20/2015    8:57 AM  Hepatic Function  Total Protein 6.0 - 8.5 g/dL 6.6  6.6  6.6   Albumin 3.6 - 4.6 g/dL 4.3  4.3  4.0   AST 0 - 40 IU/L 33  45  29   ALT 0 - 44 IU/L 44  54  30   Alk Phosphatase 44 - 121 IU/L 77  73  68   Total Bilirubin 0.0 - 1.2 mg/dL 0.8  0.7  0.8       Latest Ref Rng & Units 02/15/2021    8:12 AM 06/30/2019    8:59 AM 04/28/2019    2:20 PM  CBC  WBC 3.4 - 10.8 x10E3/uL 8.2  6.7  9.5   Hemoglobin 13.0 - 17.7 g/dL 14.3  14.1  14.5   Hematocrit 37.5 - 51.0 % 43.4  43.6  44.1   Platelets 150 - 450 x10E3/uL 149  146  187    Lab Results  Component Value Date   MCV 92 02/15/2021   MCV 92 06/30/2019   MCV 91 04/28/2019   Additional  studies reviewed: I reviewed all the records from [facility] including his CT images of January 25, 2019 following his recent syncopal spell induced  Trauma.  ECHO 7//28/2020 IMPRESSIONS  1. The left ventricle has normal systolic function with an ejection fraction of 60-65%. The cavity size was normal. There is moderate asymmetric left ventricular hypertrophy. Left ventricular diastolic Doppler parameters are consistent with  pseudonormalization.  2. The right ventricle has normal systolic function. The cavity was normal. There is no increase in right ventricular wall thickness. Right ventricular systolic pressure could not be assessed.  3. Left atrial size was mildly dilated.  4. The aortic valve is abnormal. Moderate  calcification of the aortic valve. Aortic valve regurgitation is trivial by color flow Doppler. Mild stenosis with a mean systolic gradient of 11 mmHg of the aortic valve.  5. The aorta is normal in size and structure when indexed to body surface area (ascending aorta measures 38 mm).  CARDIAC MRI 02/11/2019 IMPRESSION: 1. Normal left ventricular size with mild LV hypertrophy. This appears concentric and not asymmetric. EF 60%, normal wall motion.   2. The RV is mildly dilated with mild to moderate hypokinesis, EF 35%.   3. There is no definite myocardial LGE, so no definitive evidence for prior MI, infiltrative disease, or myocarditis.   Abnormal right ventricle. However, there is no definitive evidence for cardiac sarcoidosis or hypertrophic cardiomyoapthy.  ----------------------------------------------------------------------------------------------------------- CTA CORONARY :  IMPRESSION: 03/07/2019  1. Coronary calcium score of 0. This was 0 percentile for age and sex matched control.   2. Normal coronary origin with right dominance.   3. No evidence of CAD.   Fransico Him  ADDENDUM/ Edited Result :  CTA  Aorta: Normal size. Calcifications of the aortic root and ascending aorta. No dissection.   Aortic Valve:  Trileaflet.  Moderate calcifications.   Coronary Arteries:  Normal coronary origin.  Left dominance.   RCA is a small non dominant artery that gives rise to PDA and PLVB. There is mild calcified plaque in the proximal RCA with associated stenosis of 25-49%. There is motion artifact in the mid and distal RCA.   Left main is a large artery that gives rise to LAD and LCX arteries. There is moderate calcified plaque in the mid to distal LM with associated 50-69% stenosis.   LAD is a large vessel that gives rise to a large D1 and moderate sized D2. There is moderate calcified plaque in the proximal LAD with associated 50-69% stenosis. There is severe calcified  plaque in the mid LAD with associated stenosis of 70-99%.   LCX is a large dominant artery that gives rise to one large OM1 branch. There is minimal calcified plaque in the proximal to mid LCx with associated stenosis of 0-24%. There is mild calcified plaque in the mid LCx at the takeoff of a moderate sized branching OM with associated stenosis of 25-49%. There is minimal atherosclerosis of the distal LCx with associated stenosis of 0 - 24%.   Other findings:   Normal pulmonary vein drainage into the left atrium.   Normal let atrial appendage without a thrombus.   Normal size of the pulmonary artery.   IMPRESSION: 1. Coronary calcium score of 1151. This was 66th percentile for age and sex matched control.   2.  Normal coronary origin with left dominance.   3.  Moderate to severe calcified AV cusps.   4.  Atherosclerosis of the aortic root and ascending aorta.   5.  Moderate Calcified plaque in the distal LM and severe calcified plaque in the mid LAD. CAD-RADS 4b. Significant blooming may over estimate LAD stenosis.   6.  Recommend aggressive risk factor modification.   7.  Recommend cardiac catheterization.   8.  Study has been sent for [facility] analysis.   Traci Turner  -----------------------------------------------------------------------------------------  FFR FINDINGS: FFRCT analysis was performed on the original cardiac CT angiogram dataset. Diagrammatic representation of the FFRct analysis is provided in a separate PDF document in PACS. This dictation was created using the PDF document and an interactive 3D model of the results. 3D model is not available in the EMR/PACS. Normal FFR range is >0.80.   1. No significant flow limiting lesion: LM FFR = 0.99.   2. LAD: No significant flow limiting lesion: Proximal FFR = 0.98, Mid FFR = 0.96, Distal FFR = 0.89.   3. LCX: No significant flow limiting lesion: Proximal FFR = 0.97, Mid FFR = 0.94, Distal FFR = 0.91.    4. RCA: No flow limiting lesion in non dominant small RCA: Proximal FFR = 1.00, Mid FFR = 0.97, distal FFR not obtained.   IMPRESSION: 1. CT FFR analysis show no hemodynamically significant flow limiting lesions.   ------------------------------------------------------------------------------------------- CARDIAC CATH 05/02/2019 Mid LM to Dist LM lesion is 30% stenosed. Prox LAD lesion is 30% stenosed. Mid LAD lesion is 20% stenosed. Dist LAD lesion is 40% stenosed.   There is evidence for mild to moderate coronary calcification without high-grade obstructive disease.  There is smooth distal tapering of the left main with 30% narrowing; 30% proximal and 20% mid ossified stenoses in the LAD with 40% diffuse stenosis in a small caliber mid LAD vessel.  The left circumflex vessel is an angiographically normal dominant vessel; the RCA is normal nondominant vessel.  There is a small caliber ramus intermediate branch which appears to fill directly into the left ventricle resulting in ventricular opacification,   Mild transvalvular aortic gradient at 10 to 35mHg going from the left ventricle to the aorta consistent with his known very mild aortic valve stenosis.  There is mild aortic valve calcification.   RECOMMENDATION:  The patient will continue on aspirin therapy as well as his low-dose beta-blocker therapy.  A loop recorder will be placed in the short stay following his catheterization with careful close monitoring of his rhythm status.  Continue aggressive lipid-lowering therapy with target LDL ideally in the 50s or below.  The patient was recently switched from simvastatin to rosuvastatin 40 mg.       LOOP RECORDER DEVICE CHECK: 06/08/2019 [facility] summary report received. Battery status OK. Normal device function. No new symptom episodes, tachy episodes, brady, or pause episodes. No new AF episodes. Monthly summary reports and ROV/PRN  ECHO 07/05/2019 IMPRESSIONS   1. Left ventricular  ejection fraction, by estimation, is 65 to 70%. The  left ventricle has hyperdynamic function. The left ventrical has no  regional wall motion abnormalities. Left ventricular diastolic parameters  are consistent with Grade I diastolic  dysfunction (impaired relaxation).   2. Right ventricular systolic function is normal. The right ventricular  size is mildly enlarged. There is moderately elevated pulmonary artery  systolic pressure. The estimated right ventricular systolic pressure is  499991111mmHg.   3. No evidence of mitral valve regurgitation.   4. Tricuspid valve regurgitation is moderate.   5. Decreased motion of left and non coronary cusp. The aortic valve is  tricuspid. Aortic valve regurgitation is moderate. Mild aortic valve  stenosis.  Aortic valve area, by VTI measures 1.78 cm. Aortic valve mean  gradient measures 10.5 mmHg. Aortic  valve Vmax measures 2.17 m/s.   6. Aortic dilatation noted. 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   7. The inferior vena cava is normal in size with greater than 50%  respiratory variability, suggesting right atrial pressure of 3 mmHg.   Comparison(s): No significant change from prior study. Prior images  reviewed side by side.    ECHO 08/09/2020: IMPRESSIONS   1. Left ventricular ejection fraction, by estimation, is 60 to 65%. The  left ventricle has normal function. The left ventricle has no regional  wall motion abnormalities. There is mild asymmetric left ventricular  hypertrophy of the basal-septal segment.  Left ventricular diastolic parameters are consistent with Grade I  diastolic dysfunction (impaired relaxation).   2. Right ventricular systolic function is normal. The right ventricular  size is mildly enlarged.   3. The mitral valve is grossly normal. No evidence of mitral valve  regurgitation. No evidence of mitral stenosis.   4. The aortic valve is tricuspid. There is moderate calcification of the   aortic valve. There is moderate thickening of the aortic valve. Aortic  valve regurgitation is mild. Mild aortic valve stenosis. Aortic valve  area, by VTI measures 3.07 cm. Aortic  valve mean gradient measures 10.0 mmHg. Aortic valve Vmax measures 2.14  m/s.   Comparison(s): No significant change from prior study. LV function  unchanged. Aortic stenosis remains mild. Mild AI on this study.    IMPRESSION:  1. Paroxysmal atrial fibrillation (HCC)   2. Snoring   3. CAD in native artery   4. Mild aortic stenosis   5. Hyperlipidemia with target LDL less than 70   6. RBBB (right bundle branch block)   7. Encounter for anticoagulation discussion and counseling   8. NSVT (nonsustained ventricular tachycardia) ([place]): 04/26/2022   9. Elevated LFTs     ASSESSMENT AND PLAN: Eugene [name] is an active 89 year old gentleman who has a remote history of prior sarcoid disease with ultimate complete remisson.  He has a history of mild hyperlipidemia which has been aggressively controlled.  Remotely, he was found to have mild coronary calcification on CT imaging.  I have been aggressive with lipid therapy in attempt to potentially induce plaque regression and previously he has been on Zetia/simvastatin 10/40 with LDL cholesterols previously documented to be 70.  In 2016 he was found to have aortic stenosis which was mild with mild aortic insufficiency with a mean gradient of 9 and peak gradient of 18.  Around Labor Day in 2020 while [place] the beach [place] on a hot day he had a syncopal spell.  He has no recollection of falling and when he awakened he was in [facility] emergency room.  It does not appear that he had any significant awareness of rhythm disturbance and apparently when EMS arrived he must have had 6 stable cardiac rhythm.  In July 2020  a follow-up echo Doppler study confirmed just mild aortic stenosis with a valve area of 1.7 cm.  It is unlikely that the severity of his aortic stenosis  accounts for his syncope.  Recently, his ECG showed definite right bundle branch block which in the past had just been incomplete right bundle.  On his ECG of February 14, 2019 he was in sinus rhythm, although bradycardic at 53 with right bundle branch block.  QTc interval was normal.  I was  concerned potential ischemic etiology scheduled him to undergo a coronary CTA for further evaluation of potential significant coronary obstructive disease. I  also scheduling him for carotid duplex imaging to assess his carotid arteries and vertebral flow.  His Zio patch findings demonstrated predominant sinus rhythm but revealed several episodes of supraventricular tachycardia, as well as episodes of isolated ectopic in isolated ventricular ectopy.  His fastest heartbeat was 190 bpm which lasted 4 beats and was consistent with SVT.  The longest SVT episode was 12.8 seconds with an average rate of 138 bpm. 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  Definitive cardiac catheterization demonstrated mild to moderate coronary calcification with nonobstructive plaque in his left main and LAD of approximately 30%. He continues to remain asymptomatic and is on a more aggressive treatment to attempt plaque regression rosuvastatin 40 mg and Zetia 10 mg.  Lipid studies in February 2021 showed an LDL of 30.  Laboratory by Dr. Sandi Mariscal in December 2021 continued to show excellent labs with LDL cholesterol at 43, triglycerides 65 HDL 45 and total cholesterol 102.  LDL particle number was low at 732.  I have continued to be aggressive with lipid-lowering therapy and he is tolerating Zetia 10 mg and rosuvastatin 40 mg combination.  His most recent echo Doppler study in March 2022 essentially was unchanged from his prior evaluation and continued to show an EF of 60 to 123456, grade 1 diastolic dysfunction, mild asymmetric LVH at the basal septum and moderate calcification of a trileaflet  aortic valve with mild aortic stenosis (mean gradient 10 and peak gradient 18 mmHg).  Laboratory from September 2022 continue to be excellent with LDL cholesterol of 38 and attempt to induce plaque regression. He has tolerated rosuvastatin 40 mg and Zetia 10 mg.  I last saw him on May 09, 2022 after his loop recorder revealed a brief episode of nonsustained VT lasting 15 seconds at a rate of 162 which occurred at 6:25 PM on April 26, 2022.  He was asymptomatic and did not recall any sense of palpitations.  The analysis was reviewed with Dr. Sallyanne Kuster.  At that time I kept him on his current dose of metoprolol succinate which she was taking 12.5 mg every other day due to bradycardia.  His most recent loop recorder download has demonstrated his first episode of atrial fibrillation which lasted 56 minutes on the morning of June 15, 2021 approximately 23 hours after he had taken the metoprolol dose on January 20.  Apparently there were some bursts up to 156 bpm.  His AF burden was very low at 0.1%.  In the office today I had a very lengthy discussion with him concerning risk benefits of initiation of anticoagulation therapy to reduce potential stroke particularly if recurrent AF episodes develop.  Of note, this most recent episode occurred while sleeping and today his wife admitted that he has been snoring raising concern for potential obstructive sleep apnea contributing to nocturnal hypoxemia contributing to his atrial fibrillation development.  After long discussion with shared decision making we have made the decision for him to discontinue aspirin and in its place start Eliquis 5 mg twice a day.  He will be continued to be monitored with his loop recorder in place.  I am also scheduling him for an in lab sleep study to assess for sleep apnea.  He tells me he had follow-up laboratory today Dr. Deboraha Sprang office to reassess his previous mild transaminase  elevation.  If still elevated, I would suggest slight  decrease of rosuvastatin to 20 mg from 40 mg particularly with his outstanding consistent LDL at 32.  He does have multivessel coronary calcification previously documented on his CTA and at cardiac catheterization.  I will see him in 3 to 4 months for follow-up evaluation or sooner as needed.   Troy Sine, MD, [facility]  07/11/2022 12:06 PM

## 2022-07-11 ENCOUNTER — Encounter: Payer: Self-pay | Admitting: Cardiovascular Disease

## 2022-07-18 ENCOUNTER — Ambulatory Visit
Admission: RE | Admit: 2022-07-18 | Discharge: 2022-07-18 | Disposition: A | Discharge status: Home / Self Care | Payer: Medicare Other | Source: Ambulatory Visit | Attending: Family Medicine | Admitting: Family Medicine

## 2022-07-18 ENCOUNTER — Other Ambulatory Visit: Payer: Self-pay | Admitting: Family Medicine

## 2022-07-18 DIAGNOSIS — M25551 Pain in right hip: Secondary | ICD-10-CM

## 2022-07-18 DIAGNOSIS — M545 Low back pain, unspecified: Secondary | ICD-10-CM

## 2022-08-07 ENCOUNTER — Ambulatory Visit (INDEPENDENT_AMBULATORY_CARE_PROVIDER_SITE_OTHER): Payer: Medicare Other

## 2022-08-07 DIAGNOSIS — I452 Bifascicular block: Secondary | ICD-10-CM

## 2022-08-08 LAB — CUP PACEART REMOTE DEVICE CHECK
Date Time Interrogation Session: 20240313232119
Implantable Pulse Generator Implant Date: 20201207

## 2022-08-20 NOTE — Progress Notes (Signed)
Carelink Summary Report / Loop Recorder 

## 2022-09-08 ENCOUNTER — Ambulatory Visit (INDEPENDENT_AMBULATORY_CARE_PROVIDER_SITE_OTHER): Payer: Medicare Other

## 2022-09-08 DIAGNOSIS — R55 Syncope and collapse: Secondary | ICD-10-CM | POA: Diagnosis not present

## 2022-09-09 LAB — CUP PACEART REMOTE DEVICE CHECK
Date Time Interrogation Session: 20240414231643
Implantable Pulse Generator Implant Date: 20201207

## 2022-09-09 NOTE — Progress Notes (Signed)
Carelink Summary Report / Loop Recorder 

## 2022-10-08 ENCOUNTER — Ambulatory Visit: Payer: Medicare Other | Attending: Cardiovascular Disease | Admitting: Cardiovascular Disease

## 2022-10-08 VITALS — BP 108/62 | HR 56 | Ht 69.0 in | Wt 190.0 lb

## 2022-10-08 DIAGNOSIS — R7989 Other specified abnormal findings of blood chemistry: Secondary | ICD-10-CM | POA: Diagnosis present

## 2022-10-08 DIAGNOSIS — I2510 Atherosclerotic heart disease of native coronary artery without angina pectoris: Secondary | ICD-10-CM | POA: Diagnosis present

## 2022-10-08 DIAGNOSIS — R0683 Snoring: Secondary | ICD-10-CM | POA: Diagnosis present

## 2022-10-08 DIAGNOSIS — E785 Hyperlipidemia, unspecified: Secondary | ICD-10-CM | POA: Diagnosis present

## 2022-10-08 DIAGNOSIS — I452 Bifascicular block: Secondary | ICD-10-CM

## 2022-10-08 DIAGNOSIS — I480 Paroxysmal atrial fibrillation: Secondary | ICD-10-CM | POA: Diagnosis present

## 2022-10-08 DIAGNOSIS — I350 Nonrheumatic aortic (valve) stenosis: Secondary | ICD-10-CM

## 2022-10-08 DIAGNOSIS — I4729 Other ventricular tachycardia: Secondary | ICD-10-CM

## 2022-10-08 NOTE — Patient Instructions (Signed)
Medication Instructions:  No changes *If you need a refill on your cardiac medications before your next appointment, please call your pharmacy*  Follow-Up: At [facility], you and your health needs are our priority.  As part of our continuing mission to provide you with exceptional heart care, we have created designated Provider Care Teams.  These Care Teams include your primary Cardiologist (physician) and Advanced Practice Providers (APPs -  Physician Assistants and Nurse Practitioners) who all work together to provide you with the care you need, when you need it.  We recommend signing up for the patient portal called "MyChart".  Sign up information is provided on this After Visit Summary.  MyChart is used to connect with patients for Virtual Visits (Telemedicine).  Patients are able to view lab/test results, encounter notes, upcoming appointments, etc.  Non-urgent messages can be sent to your provider as well.   To learn more about what you can do with MyChart, go to [email or web address].    Your next appointment:    03/31/23 at 0800  Provider:   Dr Tresa Endo

## 2022-10-08 NOTE — Progress Notes (Unsigned)
Patient ID: Eugene [name], male   DOB: 10/03/1932, 89 y.o.   MRN: [id number]     Primary MD: Dr. Mosetta Putt  HPI: Eugene [name] is a 89 y.o. male who presents to the office today for a 3 month cardiology evaluation.  [name] has a remote history of sarcoidosis and had been followed at [facility] with complete remission. He has a history of mild hyperlipidemia and has been on Vytorin 10/40. He also has a history of BPH on Proscar.  An echo Doppler study which was done to evaluate a systolic murmur in the aortic region noted in December 2013 showed normal systolic function with mild focal basal hypertrophy of the septum. He had normal systolic function. There was evidence for a moderate diffuse calcification involving the noncoronary cusp of a trileaflet aortic valve. He did have sclerosis without stenosis. Valve area was 2.55 cm. RV size was upper normal to mildly dilated. A nuclear perfusion study done in 2013 was unchanged from 6 years previously and continued to show normal perfusion.  Laboratory done by Dr. Eula Listen in August 2015 was normal with a hemoglobin of 14.5, hematocrit 43.8.  Renal function was excellent with a BUN of 19, Cr 0.99.  Fasting glucose was 78.  Lipid studies remained excellent with a total cholesterol 125, triglycerides 14, HDL cholesterol 44, and LDL cholesterol at 60.  Angiotensin converting enzyme was normal at 37 and last year was 33.  He underwent an echo Doppler study in 03/13/2015.  This revealed an ejection fraction at 60-65%. There was grade 1 diastolic dysfunction.  He had normal LV filling pressures.  His aortic valve was calcified and there was evidence for mild aortic stenosis with a mean gradient of 9, peak gradient of 18, and valve area of 1.87 cm and mild aortic insufficiency.  There was mild mitral annular calcification with trivial MR , mild LA dilatation, and moderate TR with PA pressure 30 mm. He underwent right knee replacement  surgery by Dr. Hadassah Pais in November.  Postoperatively had some swelling and lower extremity venous studies were negative.  When I saw him in 2017, he was doing well from a cardiac standpoint.   Specifically, he denied any episodes of chest pain.  He was able to play tennis again following his knee surgery.  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  He had also undergone follow-up CT imaging asked and was found to have a new irregularly-shaped nodular density in the left lower lobe, alt to represent a possible focus of active infection/inflammation.  A follow-up CT was recommended.  After a course of antimicrobial therapy.  His CT also demonstrated a stable ectatic.  Borderline aneurysmal ascending thoracic aorta at 3.9 cm.  He had evidence for coronary calcification.   Laboratory in 2017 by his primary physician showed total cholesterol was 134, triglycerides 79, HDL 43, and LDL 75 on his current dose of ezetimibe 10 mg and simvastatin 40 mg.   When I saw him in March 2019 he was remaining active and was walking on elliptical machine at least 3-4 times per week and was playing tennis 2 times per week.  He denied any chest pain or shortness of breath.  He denies palpitations.   In 2019, he was found to have a pulmonary infiltrate for which she was evaluated by  Dr. Vassie Loll.  He had undergone bronchoscopy with biopsy and ultimately underwent an evaluation at [facility] and this nodule had not significantly changed over the past 16 years.  I evaluated him in May 2020 and a telemedicine visit.  At that time he denied any chest pain, palpitations, presyncope or syncope.  He denied any PND orthopnea.  He had previously been followed by Dr. Link Snuffer but he had recently signed up for concierge medicine with Dr. Mosetta Putt.  During that evaluation I recommended he undergo a  follow-up echo Doppler study to reassess his aortic valve since in 2016 he was found to have mild aortic stenosis.  He underwent an echo Doppler study on December 21, 2018 which continues to show normal systolic function with EF at 60 to 65%, and moderate asymmetric left ventricular hypertrophy.  There was grade 2 diastolic dysfunction.  There was mild aortic stenosis with moderate calcification of the aortic valve.  He had a mean systolic gradient of 11 mm and his peak gradient was 20.8 mm.  Calculated aortic valve was 1.7 cm and was consistent with mild aortic stenosis.  On January 25, 2019 while biking at [address] he apparently had a syncopal spell.  He was found by bystanders.  Apparently he is amnesic to the event and when he woke up he was at [facility] in the emergency room.  It did not appear that he was aware when he had fallen since there was no apparent marks on his hands to break a fall.  A CT scan of the maxillofacial area demonstrated a comminuted displaced bilateral nasal bone fracture as well as fracture of the right zygomatic arch.  There was a frontal scalp hematoma.  He had extensive CT imaging of his chest abdomen and pelvis.  Of note, on his chest CT he was felt to have severe coronary artery atherosclerotic calcifications.  There was no evidence for acute traumatic injury within the chest abdomen or pelvis.  He had a 3.6 cm indeterminate liver mass, 4.3 cm indeterminate left renal lesion and had multiple bladder stones and bladder diverticula likely related to chronic outlet obstruction from an enlarged prostate and had nonobstructive bilateral nephrolithiasis.  Upon his return to [facility], he was evaluated by Antoine Primas in the [place] primary care.  He subsequently was evaluated by Dr. Janne Napoleon in our office for cardiology evaluation during my absence.  With his memory absence, it is felt that he had a concussion.  When seen by Dr. Cristal Deer, she  reviewed his extensive evaluation and recent echo which confirmed that he did not have significant aortic stenosis.  Because of his remote history of sarcoidosis, she scheduled him to undergo cardiac MRI imaging to assess for scar/infiltrative disease.  He was also scheduled to undergo 14-day Ziotelemetry monitor.    I saw him in follow-up of Dr. Cristal Deer on February 14, 2019.  At the time of his evaluation he had not yet received his Zio patch monitor.  I reviewed extensively his records from [facility] as well as the records of Dr. Cristal Deer.  His MR scan was reviewed and did not show any definitive myocardial late gadolinium enhancement so there was no definitive evidence for prior myocardial infarction, infiltrative disease or myocarditis.  LVEF was approximately 60%.  His RV EF was reported at 35% however when reviewed by Dr. Cristal Deer she had felt in 20 function was somewhat better.  I scheduled him to undergo coronary CTA to assess potential CAD  disease with silent ischemia leading to his clinical event.  He also underwent carotid duplex imaging.Marland Kitchen  His 14-day ZIO monitor showed predominant sinus rhythm with right bundle branch block.  He did have an average heart rate at 62 bpm with a minimum of 42 while sleeping and maximal heart rate of 190.  He had several short bursts of SVT the fastest interval lasting 4 beats with a maximum rate of 190.  The longest SVT episode was 12.8 seconds with an average heart rate of 138 bpm.  There was no VT or atrial fibrillation or high degree block or pauses noted.  Carotid duplex imaging essentially normal with normal antegrade vertebral and subclavian flow and was without evidence for internal carotid stenoses.  I received an initial report of his CT angiogram which came back as normal.  We had notified him of the results.  Apparently, there was an addendum to the report port when it was reinterpreted adjusted multivessel CAD with a calcium score of  1151 with moderate calcific plaque in the mid distal left main, severe calcific plaque in the mid LAD mild calcific plaque in the circumflex and RCA.  There was concern of possible significant blooming artifact which may have contributed to overestimate of LAD stenoses.  Subsequent FFR analysis was normal and did not reveal any hemodynamic significant stenoses.   I had a long conversation with Mr. Brkic as well as called his daughter in [place] in follow-up of his April 08, 2019 office visit.  At that time I recommended initiation of aspirin 81 mg and initiated metoprolol succinate 12.5 mg daily.  I also discussed changing him to more aggressive lipid-lowering therapy and he recently was started on rosuvastatin 40 mg in place of simvastatin. When I last saw him in the office on April 18, 2019 I recommended definitive cardiac catheterization in light of his coronary CT angio findings also recommended insertion of a loop monitor for potential long-term monitoring of potential cardiac arrhythmia.   Mr. Klusman underwent cardiac catheterization on May 02, 2019 by me. There was mild to moderate coronary calcification without high-grade obstructive disease. There was a smooth distal tapering of his left main with 30% narrowing, 30% proximal and 20% mid calcified stenoses in the LAD with 40% diffuse stenosis in a small caliber mid LAD vessel. The left circumflex vessel was angiographically normal and dominant. The RCA was a normal nondominant vessel. Of note, there was a small caliber ramus intermediate branch which appeared to fill directly into the left ventricle resulting in left ventricular opacification. He had only very mild aortic stenosis with a transvalvular aortic gradient at 10 to 12 mm. There was mild aortic valve calcification. There that day before going home he underwent insertion of a loop recorder by Dr. Royann Shivers.  I saw him in follow-up of his catheterization and loop recorder on May 19, 2019.  Over the last several weeks, Mr. Mctier has felt well. He denies any episodes of chest pain or shortness of breath. Loop recorder interrogation to date has not shown any events.   Since I saw him in December 2020, he has been undergoing monthly device checks of his loop recorder.  He has normal device function.  There are no new symptom episodes or episodes of tachycardia, bradycardia or pauses.  There were no new AF episodes.  He underwent a follow-up echo Doppler study on July 05, 2019.  This essentially is unchanged from his prior evaluation and continues to show hyperdynamic LV function.  He has grade 1 diastolic dysfunction.  There is decreased motion of his left and noncoronary cusp of a trileaflet aortic valve.  Aortic stenosis is mild with a mean gradient of 10.5 and peak gradient of 18.8.  There is moderate aortic insufficiency.  Ascending aorta is mildly dilated at 40 mm.  There is moderate tricuspid regurgitation.  I saw him in February 2021 at which time he continued to feel well.  He specifically denied any chest pain, PND orthopnea, presyncope or syncope or awareness of palpitations.    He underwent recent laboratory now on his regimen of Zetia plus rosuvastatin 40 mg, and LDL cholesterol is now markedly reduced from 72 down to 30 with combination treatment.  There is minimally elevated AST and ALT levels of 45 and 5 respectively.  He remains asymptomatic.  I saw him on February 10, 2020.  He had seen Dr. Mosetta Putt who is his primary provider andunderwent recent laboratory which I was able to obtain today after his office visit and called him to discuss the findings with him.  Laboratory from January 03, 2020 was excellent.  LDL cholesterol was 36.  He underwent particle assessment and LDL particle number was optimal at 905 as was small LDL particles at 138.  April lipoprotein B was excellent at 49 and LP(a) was excellent at 27.  He continues to be active and plays tennis and does  elliptical.  He also rides his bike.  He denies any anginal symptoms.  He continues to have monthly loop recorder checks.  His most recent evaluation did not reveal any events.   He was asymptomatic and continued to be on low-dose metoprolol succinate 12.5 mg daily with stable blood pressure.  I saw him on August 15, 2020.  Since his prior evaluation he had a follow-up echo Doppler study on August 09, 2020.  This essentially was unchanged from previously and showed an EF of 60 to 65% with grade 1 diastolic dysfunction.  There was mild asymmetric LVH at of the basal septal segment.  There was moderate calcification of a trileaflet aortic valve with mild aortic stenosis.  His mean gradient was 10 mmHg with a peak gradient of 18.3.  He continued to be on Zetia and rosuvastatin 40 mg for hyperlipidemia and metoprolol succinate 12.5 mg. He denied any palpitations.  He continues to undergo remote loop recorder check at 79-month intervals.  He has not had any clinically significant events and has normal battery status.   I saw him on January 24, 2021 and since his prior evaluation he continued to be stable.  He specifically denied presyncope or syncope or awareness of palpitations.  He denied any chest pain or exertional dyspnea.  His resting pulse is in the 50s and he remains asymptomatic.  He had undergone laboratory by Dr. Mosetta Putt in December 2021.  Total cholesterol was 102, HDL 45, triglycerides 65, and LDL 43.  Renal function was stable with a creatinine of 1.1.  He continues to be active and rides a bike.  He will be going to [place] at the end of next week.  I reviewed his recent implantable loop recorder downloads.  He has not had any clinically significant events.   I saw him on November 19, 2021 at which time he remained stable.  He continued to play tennis and ride his bike.  He was unaware of any palpitations.  He has not had any arrhythmias noted on his loop recorder interrogations.  He will be  traveling to [place] in [place].  He continues to be on Zetia 10 mg and rosuvastatin 40 mg for aggressive lipid management.  In September 2022 LDL was 38 with total cholesterol 101 and triglycerides 77.  He tells me that Dr. Duaine Dredge checked complete laboratory in January.  I do not have these results and will contact his office so that they can be sent to me for my review.  He continues to be on metoprolol succinate 12.5 mg daily and aspirin 81 mg with his CAD.    I last saw him on May 09, 2022 at which time he continued to be asymptomatic and was still playing tennis.   His most recent loop recorder download for the first time picked up a 15-second period of self-limiting nonsustained VT at a rate of 162 which occurred on December 2 at 6:25 PM.  He was completely asymptomatic and was  unaware of any tachycardia at the time.  He denies any presyncope or syncope.  He denies any chest pain.  He had laboratory by Dr. Duaine Dredge.  During that evaluation, we had a lengthy discussion.  His ECG showed sinus bradycardia and he was taking metoprolol succinate 12.5 mg every other day with his resting heart rate at 57.  Since I last saw him, Mr. Berretta has continued to be asymptomatic.  However, on his most recent loop recorder read out, he had experienced an episode of atrial fibrillation on January 21 at 5:56 AM.  He was unaware of this episode but apparently it lasted for 56 minutes and had bursts of increased heart rate up to 156 bpm with average rate of 98.  This occurred while he was sleeping in the early morning.  Since he takes his metoprolol on even days he was 23 hours since his last dose of 12.5 mg.  Upon questioning, his wife states that he does snore.  He has not been evaluated for atrial fibrillation.  He was contacted to come to the office to discuss potential need for anticoagulation initiation and he is here with his wife today for further discussion and  recommendations.  Past Medical History:  Diagnosis Date   Bladder stones    BPH (benign prostatic hyperplasia)    Hernia of abdominal cavity    History of kidney stones    Hyperlipidemia    Incomplete right bundle branch block    Low O2 saturation 04/04/2015   Wife says O2 sats are in the low 90s base line.  Now in the 80s.  Will do incentive spirometry   Primary localized osteoarthritis of right knee 04/02/2015   Sarcoidosis    diagnosed 8-9 yrs ago   Upper GI bleed 04/04/2015   Patient had coffee ground emesis that was Guiac positive on Monday.  EGD scheduled for 04/04/2015. Started on IV Protonix    Past Surgical History:  Procedure Laterality Date   ANTERIOR CERVICAL DECOMP/DISCECTOMY FUSION  2007   BRONCHOSCOPY  2007   CARDIOVASCULAR STRESS TEST  04/29/2012   normal nuclear stress study.    CYSTOSCOPY  2004   DOPPLER ECHOCARDIOGRAPHY  04/29/2012   EF not noted. small perimembranous ventricular septal defect. small left to right ventricular shunt. moderate diffuse calcification involving noncoronary cusp of the aortic valve.    ESOPHAGOGASTRODUODENOSCOPY  04/04/2015   ESOPHAGOGASTRODUODENOSCOPY N/A 04/04/2015   Procedure: ESOPHAGOGASTRODUODENOSCOPY (EGD);  Surgeon: Ruffin Frederick, MD;  Location: [facility] ENDOSCOPY;  Service: Gastroenterology;  Laterality: N/A;   INGUINAL HERNIA REPAIR Right  2009   LEFT HEART CATH AND CORONARY ANGIOGRAPHY N/A 05/02/2019   Procedure: LEFT HEART CATH AND CORONARY ANGIOGRAPHY;  Surgeon: Lennette Bihari, MD;  Location: [facility] INVASIVE CV LAB;  Service: Cardiovascular;  Laterality: N/A;   LOOP RECORDER INSERTION N/A 05/02/2019   Procedure: LOOP RECORDER INSERTION;  Surgeon: Thurmon Fair, MD;  Location: [facility] INVASIVE CV LAB;  Service: Cardiovascular;  Laterality: N/A;   TOTAL KNEE ARTHROPLASTY Right 04/02/2015   Procedure: TOTAL KNEE ARTHROPLASTY;  Surgeon: Salvatore Marvel, MD;  Location: [facility] OR;  Service: Orthopedics;  Laterality: Right;   VIDEO BRONCHOSCOPY  Bilateral 09/23/2017   Procedure: VIDEO BRONCHOSCOPY WITH FLUORO;  Surgeon: Oretha Milch, MD;  Location: [facility] ENDOSCOPY;  Service: Cardiopulmonary;  Laterality: Bilateral;    No Known Allergies  Current Outpatient Medications  Medication Sig Dispense Refill   apixaban (ELIQUIS) 5 MG TABS tablet Take 1 tablet (5 mg total) by mouth 2 (two) times daily. 60 tablet 3   Cholecalciferol (VITAMIN D) 50 MCG (2000 UT) CAPS Take 2,000 Units by mouth daily.     ezetimibe (ZETIA) 10 MG tablet TAKE (1) TABLET DAILY AT BEDTIME. 90 tablet 3   finasteride (PROSCAR) 5 MG tablet Take 5 mg by mouth every evening.      metoprolol succinate (TOPROL-XL) 25 MG 24 hr tablet Take 0.5 tablets (12.5 mg total) by mouth every other day. 23 tablet 3   potassium citrate (UROCIT-K) 10 MEQ (1080 MG) SR tablet Take 10 mEq by mouth daily.     rosuvastatin (CRESTOR) 40 MG tablet Take 40 mg by mouth daily.     No current facility-administered medications for this visit.    Social History   Socioeconomic History   Marital status: Married    Spouse name: Not on file   Number of children: Not on file   Years of education: Not on file   Highest education level: Not on file  Occupational History   Not on file  Tobacco Use   Smoking status: Former    Types: Pipe    Quit date: 05/26/1972    Years since quitting: 50.4   Smokeless tobacco: Never   Tobacco comments:    Quit 43 year  Substance and Sexual Activity   Alcohol use: Yes    Alcohol/week: 4.0 standard drinks of alcohol    Types: 4 Standard drinks or equivalent per week   Drug use: No   Sexual activity: Not on file  Other Topics Concern   Not on file  Social History Narrative   Not on file   Social Determinants of Health   Financial Resource Strain: Not on file  Food Insecurity: Not on file  Transportation Needs: Not on file  Physical Activity: Not on file  Stress: Not on file  Social Connections: Not on file  Intimate Partner Violence: Not on file    Socially he is married. He  9 grandchildren. One son lives in [place], a daughter lives in [place]. There is no tobacco use. He does take occasional alcohol. He does exercise.  Family History  Problem Relation Age of Onset   Ovarian cancer Mother    Heart attack Father 100    ROS General: Negative; No fevers, chills, or night sweats;  HEENT: Negative; No changes in vision or hearing, sinus congestion, difficulty swallowing Pulmonary: Negative; No cough, wheezing, shortness of breath, hemoptysis Cardiovascular: See HPI GI: Negative; No nausea, vomiting, diarrhea, or abdominal pain GU: Negative; No dysuria, hematuria, or difficulty voiding Musculoskeletal: Recent bilateral nasal  bone fractures and fracture of right zygomatic arch Hematologic/Oncology: Negative; no easy bruising, bleeding Remote history of sarcoidosis, completely resolved Endocrine: Negative; no heat/cold intolerance; no diabetes Neuro: Amnesic to his syncopal spell Skin: Negative; No rashes or skin lesions Psychiatric: Negative; No behavioral problems, depression Sleep: Negative; No snoring, daytime sleepiness, hypersomnolence, bruxism, restless legs, hypnogognic hallucinations, no cataplexy Other comprehensive 14 point system review is negative.   PE BP 108/62   Pulse (!) 56   Ht 5\' 9"  (1.753 m)   Wt 190 lb (86.2 kg)   SpO2 92%   BMI 28.06 kg/m    Repeat blood pressure by me was 128/70  Wt Readings from Last 3 Encounters:  10/08/22 190 lb (86.2 kg)  07/09/22 185 lb (83.9 kg)  05/09/22 194 lb (88 kg)   General: Alert, oriented, no distress.  Skin: normal turgor, no rashes, warm and dry HEENT: Normocephalic, atraumatic. Pupils equal round and reactive to light; sclera anicteric; extraocular muscles intact;  Nose without nasal septal hypertrophy Mouth/Parynx benign; Mallinpatti scale 2 Neck: No JVD, no carotid bruits; normal carotid upstroke Lungs: clear to ausculatation and percussion; no wheezing or  rales Chest wall: without tenderness to palpitation Heart: PMI not displaced, RRR, s1 s2 normal, 1/6 systolic murmur in the aortic area, no diastolic murmur, no rubs, gallops, thrills, or heaves Abdomen: soft, nontender; no hepatosplenomehaly, BS+; abdominal aorta nontender and not dilated by palpation. Back: no CVA tenderness Pulses 2+ Musculoskeletal: full range of motion, normal strength, no joint deformities Extremities: no clubbing cyanosis or edema, Homan's sign negative  Neurologic: grossly nonfocal; Cranial nerves grossly wnl Psychologic: Normal mood and affect  ECG (independently read by me):  Sinus bradycardia at 56, RBBB/, LVH, no ectopy  July 09, 2022 ECG (independently read by me): Sinus bradycardia at 50 bpm.  Right bundle branch block with repolarization changes.  Left axis deviation, voltage criteria for LVH.  PR interval 174 ms; QTc interval 437 ms.   May 09, 2022 ECG (independently read by me):  Sinus bradycardia at 57, :AD, RBBB, LVH   November 19, 2021 ECG (independently read by me): Sinus bradycardia at 54, LAD, RBBB, LVH  January 24, 2021 ECG (independently read by me): Sinus bradycardia at 52; RBBB with repolarization, LVH  August 15, 2020 ECG (independently read by me): Sinus bradycardia at 57, RBBB with repolarization changes  September 2021 ECG (independently read by me): Sinus bradycardia at 56, RBBB with repolarization, LVH by voltage; normal intervals, no ectopy  February 12, 20212 ECG (independently read by me): Sinus bradycardia 58 bpm, right bundle branch block with repolarization changes.  Borderline criteria for LVH.  No ectopy.  QTc interval 447 ms  December 2020 ECG (independently read by me): Sinus bradycardia at 54 bpm, right bundle branch block with repolarization changes.  Left axis deviation.  Borderline voltage criteria for LVH  April 08, 2019 ECG (independently read by me): Sinus rhythm at 65 bpm, left axis deviation, right bundle branch  block with repolarization changes.  LVH by voltage.  QTc interval 453 ms.  PR interval 146 ms.  September 2020 ECG (independently read by me): Sinus bradycardia at 53 bpm, right bundle branch block with repolarization changes.  QRS duration 156 ms.  Mild LVH by voltage in aVL.  QTc interval 439 ms  March 2019 ECG (independently read by me): normal sinus rhythm at 65 bpm.  One isolated PVC.  Incomplete right bundle branch block.  Borderline LVH.  Normal intervals.  February 2018 ECG (independently read  by me): Sinus bradycardia at 56 bpm.  Mild sinus arrhythmia.  Mild LVH.  Normal intervals.  No ST segment changes.  February 2017 ECG (independently read by me):  Normal sinus rhythm at 63 bpm.  Early transition.  No ST segment changes.  December 2015 ECG (independently read by me): Normal sinus rhythm at 66 bpm.  Mild RV conduction delay.  Early transition.  December 2014 ECG: Sinus rhythm with an occasional PVC; normal intervals.  LABS: I personally reviewed the laboratory from [facility] done on 02/19/2016.  BUN 18, creatinine 0.9.  Ingrown 14.7, hematocrit 44.9.  Normal LFTs.  TSH 2.08.  April lipoprotein B 66.  Normal PSA.  Lipid studies as noted above.  Lpid studies from March 03, 2017: Total cholesterol 134, HDL 39, LDL 70, triglycerides 124.     After he left the office I was able to obtain accurate from Dr. Duaine Dredge from his blood work on Mr. Maceda from December 2021: Cholesterol 102, HDL 45, triglycerides 65, and LDL 43. Hypoprotein particle analysis small LDL particles excellent at 281. CMP normal. CBC normal. A1c 5.8. Vitamin D 26.3.   I reviewed recent laboratory from Dr. Duaine Dredge on June 03, 2022: Potassium 4.4.  Sodium 138.  Creatinine 1.13.  Mild transaminase elevation with ALT at 66 and AST at 56.  TSH 2.58.  Hemoglobin 14.3/hematocrit 44.8.  Platelets 167 K.  Lipid studies show total cholesterol 84, HDL 36, triglycerides 84, LDL 32.     Latest Ref  Rng & Units 02/15/2021    8:12 AM 06/30/2019    8:59 AM 04/28/2019    2:20 PM  BMP  Glucose 65 - 99 mg/dL 83  88  161   BUN 8 - 27 mg/dL 19  16  21    Creatinine 0.76 - 1.27 mg/dL 0.96  0.45  4.09   BUN/Creat Ratio 10 - 24 19  16  23    Sodium 134 - 144 mmol/L 139  138  139   Potassium 3.5 - 5.2 mmol/L 5.0  4.8  4.9   Chloride 96 - 106 mmol/L 101  103  102   CO2 20 - 29 mmol/L 24  23  27    Calcium 8.6 - 10.2 mg/dL 81.1  91.4  78.2       Latest Ref Rng & Units 02/15/2021    8:12 AM 06/30/2019    8:59 AM 03/20/2015    8:57 AM  Hepatic Function  Total Protein 6.0 - 8.5 g/dL 6.6  6.6  6.6   Albumin 3.6 - 4.6 g/dL 4.3  4.3  4.0   AST 0 - 40 IU/L 33  45  29   ALT 0 - 44 IU/L 44  54  30   Alk Phosphatase 44 - 121 IU/L 77  73  68   Total Bilirubin 0.0 - 1.2 mg/dL 0.8  0.7  0.8       Latest Ref Rng & Units 02/15/2021    8:12 AM 06/30/2019    8:59 AM 04/28/2019    2:20 PM  CBC  WBC 3.4 - 10.8 x10E3/uL 8.2  6.7  9.5   Hemoglobin 13.0 - 17.7 g/dL 95.6  21.3  08.6   Hematocrit 37.5 - 51.0 % 43.4  43.6  44.1   Platelets 150 - 450 x10E3/uL 149  146  187    Lab Results  Component Value Date   MCV 92 02/15/2021   MCV 92 06/30/2019   MCV 91 04/28/2019   Additional studies  reviewed: I reviewed all the records from [facility] including his CT images of January 25, 2019 following his recent syncopal spell induced  Trauma.  ECHO 7//28/2020 IMPRESSIONS  1. The left ventricle has normal systolic function with an ejection fraction of 60-65%. The cavity size was normal. There is moderate asymmetric left ventricular hypertrophy. Left ventricular diastolic Doppler parameters are consistent with  pseudonormalization.  2. The right ventricle has normal systolic function. The cavity was normal. There is no increase in right ventricular wall thickness. Right ventricular systolic pressure could not be assessed.  3. Left atrial size was mildly dilated.  4. The aortic valve is abnormal.  Moderate calcification of the aortic valve. Aortic valve regurgitation is trivial by color flow Doppler. Mild stenosis with a mean systolic gradient of 11 mmHg of the aortic valve.  5. The aorta is normal in size and structure when indexed to body surface area (ascending aorta measures 38 mm).  CARDIAC MRI 02/11/2019 IMPRESSION: 1. Normal left ventricular size with mild LV hypertrophy. This appears concentric and not asymmetric. EF 60%, normal wall motion.   2. The RV is mildly dilated with mild to moderate hypokinesis, EF 35%.   3. There is no definite myocardial LGE, so no definitive evidence for prior MI, infiltrative disease, or myocarditis.   Abnormal right ventricle. However, there is no definitive evidence for cardiac sarcoidosis or hypertrophic cardiomyoapthy.  ----------------------------------------------------------------------------------------------------------- CTA CORONARY :  IMPRESSION: 03/07/2019  1. Coronary calcium score of 0. This was 0 percentile for age and sex matched control.   2. Normal coronary origin with right dominance.   3. No evidence of CAD.   Armanda Magic  ADDENDUM/ Edited Result :  CTA  Aorta: Normal size. Calcifications of the aortic root and ascending aorta. No dissection.   Aortic Valve:  Trileaflet.  Moderate calcifications.   Coronary Arteries:  Normal coronary origin.  Left dominance.   RCA is a small non dominant artery that gives rise to PDA and PLVB. There is mild calcified plaque in the proximal RCA with associated stenosis of 25-49%. There is motion artifact in the mid and distal RCA.   Left main is a large artery that gives rise to LAD and LCX arteries. There is moderate calcified plaque in the mid to distal LM with associated 50-69% stenosis.   LAD is a large vessel that gives rise to a large D1 and moderate sized D2. There is moderate calcified plaque in the proximal LAD with associated 50-69% stenosis. There is severe  calcified plaque in the mid LAD with associated stenosis of 70-99%.   LCX is a large dominant artery that gives rise to one large OM1 branch. There is minimal calcified plaque in the proximal to mid LCx with associated stenosis of 0-24%. There is mild calcified plaque in the mid LCx at the takeoff of a moderate sized branching OM with associated stenosis of 25-49%. There is minimal atherosclerosis of the distal LCx with associated stenosis of 0 - 24%.   Other findings:   Normal pulmonary vein drainage into the left atrium.   Normal let atrial appendage without a thrombus.   Normal size of the pulmonary artery.   IMPRESSION: 1. Coronary calcium score of 1151. This was 66th percentile for age and sex matched control.   2.  Normal coronary origin with left dominance.   3.  Moderate to severe calcified AV cusps.   4.  Atherosclerosis of the aortic root and ascending aorta.   5. Moderate  Calcified plaque in the distal LM and severe calcified plaque in the mid LAD. CAD-RADS 4b. Significant blooming may over estimate LAD stenosis.   6.  Recommend aggressive risk factor modification.   7.  Recommend cardiac catheterization.   8.  Study has been sent for [facility] analysis.   Traci Turner  -----------------------------------------------------------------------------------------  FFR FINDINGS: FFRCT analysis was performed on the original cardiac CT angiogram dataset. Diagrammatic representation of the FFRct analysis is provided in a separate PDF document in PACS. This dictation was created using the PDF document and an interactive 3D model of the results. 3D model is not available in the EMR/PACS. Normal FFR range is >0.80.   1. No significant flow limiting lesion: LM FFR = 0.99.   2. LAD: No significant flow limiting lesion: Proximal FFR = 0.98, Mid FFR = 0.96, Distal FFR = 0.89.   3. LCX: No significant flow limiting lesion: Proximal FFR = 0.97, Mid FFR = 0.94, Distal FFR =  0.91.   4. RCA: No flow limiting lesion in non dominant small RCA: Proximal FFR = 1.00, Mid FFR = 0.97, distal FFR not obtained.   IMPRESSION: 1. CT FFR analysis show no hemodynamically significant flow limiting lesions.   ------------------------------------------------------------------------------------------- CARDIAC CATH 05/02/2019 Mid LM to Dist LM lesion is 30% stenosed. Prox LAD lesion is 30% stenosed. Mid LAD lesion is 20% stenosed. Dist LAD lesion is 40% stenosed.   There is evidence for mild to moderate coronary calcification without high-grade obstructive disease.  There is smooth distal tapering of the left main with 30% narrowing; 30% proximal and 20% mid ossified stenoses in the LAD with 40% diffuse stenosis in a small caliber mid LAD vessel.  The left circumflex vessel is an angiographically normal dominant vessel; the RCA is normal nondominant vessel.  There is a small caliber ramus intermediate branch which appears to fill directly into the left ventricle resulting in ventricular opacification,   Mild transvalvular aortic gradient at 10 to going from the left ventricle to the aorta consistent with his known very mild aortic valve stenosis.  There is mild aortic valve calcification.   RECOMMENDATION:  The patient will continue on aspirin therapy as well as his low-dose beta-blocker therapy.  A loop recorder will be placed in the short stay following his catheterization with careful close monitoring of his rhythm status.  Continue aggressive lipid-lowering therapy with target LDL ideally in the 50s or below.  The patient was recently switched from simvastatin to rosuvastatin 40 mg.       LOOP RECORDER DEVICE CHECK: 06/08/2019 [facility] summary report received. Battery status OK. Normal device function. No new symptom episodes, tachy episodes, brady, or pause episodes. No new AF episodes. Monthly summary reports and ROV/PRN  ECHO 07/05/2019 IMPRESSIONS   1. Left  ventricular ejection fraction, by estimation, is 65 to 70%. The  left ventricle has hyperdynamic function. The left ventrical has no  regional wall motion abnormalities. Left ventricular diastolic parameters  are consistent with Grade I diastolic  dysfunction (impaired relaxation).   2. Right ventricular systolic function is normal. The right ventricular  size is mildly enlarged. There is moderately elevated pulmonary artery  systolic pressure. The estimated right ventricular systolic pressure is  40.9 mmHg.   3. No evidence of mitral valve regurgitation.   4. Tricuspid valve regurgitation is moderate.   5. Decreased motion of left and non coronary cusp. The aortic valve is  tricuspid. Aortic valve regurgitation is moderate. Mild aortic valve  stenosis. Aortic  valve area, by VTI measures 1.78 cm. Aortic valve mean  gradient measures 10.5 mmHg. Aortic  valve Vmax measures 2.17 m/s.   6. Aortic dilatation noted. 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   7. The inferior vena cava is normal in size with greater than 50%  respiratory variability, suggesting right atrial pressure of 3 mmHg.   Comparison(s): No significant change from prior study. Prior images  reviewed side by side.    ECHO 08/09/2020: IMPRESSIONS   1. Left ventricular ejection fraction, by estimation, is 60 to 65%. The  left ventricle has normal function. The left ventricle has no regional  wall motion abnormalities. There is mild asymmetric left ventricular  hypertrophy of the basal-septal segment.  Left ventricular diastolic parameters are consistent with Grade I  diastolic dysfunction (impaired relaxation).   2. Right ventricular systolic function is normal. The right ventricular  size is mildly enlarged.   3. The mitral valve is grossly normal. No evidence of mitral valve  regurgitation. No evidence of mitral stenosis.   4. The aortic valve is tricuspid. There is moderate calcification of  the  aortic valve. There is moderate thickening of the aortic valve. Aortic  valve regurgitation is mild. Mild aortic valve stenosis. Aortic valve  area, by VTI measures 3.07 cm. Aortic  valve mean gradient measures 10.0 mmHg. Aortic valve Vmax measures 2.14  m/s.   Comparison(s): No significant change from prior study. LV function  unchanged. Aortic stenosis remains mild. Mild AI on this study.    IMPRESSION:  No diagnosis found.   ASSESSMENT AND PLAN: Mr. Litterio is an active 89 year old gentleman who has a remote history of prior sarcoid disease with ultimate complete remisson.  He has a history of mild hyperlipidemia which has been aggressively controlled.  Remotely, he was found to have mild coronary calcification on CT imaging.  I have been aggressive with lipid therapy in attempt to potentially induce plaque regression and previously he has been on Zetia/simvastatin 10/40 with LDL cholesterols previously documented to be 70.  In 2016 he was found to have aortic stenosis which was mild with mild aortic insufficiency with a mean gradient of 9 and peak gradient of 18.  Around Labor Day in 2020 while [place] the beach [place] on a hot day he had a syncopal spell.  He has no recollection of falling and when he awakened he was in [facility] emergency room.  It does not appear that he had any significant awareness of rhythm disturbance and apparently when EMS arrived he must have had 6 stable cardiac rhythm.  In July 2020  a follow-up echo Doppler study confirmed just mild aortic stenosis with a valve area of 1.7 cm.  It is unlikely that the severity of his aortic stenosis accounts for his syncope.  Recently, his ECG showed definite right bundle branch block which in the past had just been incomplete right bundle.  On his ECG of February 14, 2019 he was in sinus rhythm, although bradycardic at 53 with right bundle branch block.  QTc interval was normal.  I was concerned potential ischemic  etiology scheduled him to undergo a coronary CTA for further evaluation of potential significant coronary obstructive disease. I  also scheduling him for carotid duplex imaging to assess his carotid arteries and vertebral flow.  His Zio patch findings demonstrated predominant sinus rhythm but revealed several episodes of supraventricular tachycardia, as well as episodes of isolated ectopic in isolated ventricular  ectopy.  His fastest heartbeat was 190 bpm which lasted 4 beats and was consistent with SVT.  The longest SVT episode was 12.8 seconds with an average rate of 138 bpm. 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  Definitive cardiac catheterization demonstrated mild to moderate coronary calcification with nonobstructive plaque in his left main and LAD of approximately 30%. He continues to remain asymptomatic and is on a more aggressive treatment to attempt plaque regression rosuvastatin 40 mg and Zetia 10 mg.  Lipid studies in February 2021 showed an LDL of 30.  Laboratory by Dr. Duaine Dredge in December 2021 continued to show excellent labs with LDL cholesterol at 43, triglycerides 65 HDL 45 and total cholesterol 102.  LDL particle number was low at 732.  I have continued to be aggressive with lipid-lowering therapy and he is tolerating Zetia 10 mg and rosuvastatin 40 mg combination.  His most recent echo Doppler study in March 2022 essentially was unchanged from his prior evaluation and continued to show an EF of 60 to 65%, grade 1 diastolic dysfunction, mild asymmetric LVH at the basal septum and moderate calcification of a trileaflet aortic valve with mild aortic stenosis (mean gradient 10 and peak gradient 18 mmHg).  Laboratory from September 2022 continue to be excellent with LDL cholesterol of 38 and attempt to induce plaque regression. He has tolerated rosuvastatin 40 mg and Zetia 10 mg.  I last saw him on May 09, 2022 after his loop recorder  revealed a brief episode of nonsustained VT lasting 15 seconds at a rate of 162 which occurred at 6:25 PM on April 26, 2022.  He was asymptomatic and did not recall any sense of palpitations.  The analysis was reviewed with Dr. Royann Shivers.  At that time I kept him on his current dose of metoprolol succinate which she was taking 12.5 mg every other day due to bradycardia.  His most recent loop recorder download has demonstrated his first episode of atrial fibrillation which lasted 56 minutes on the morning of June 15, 2021 approximately 23 hours after he had taken the metoprolol dose on January 20.  Apparently there were some bursts up to 156 bpm.  His AF burden was very low at 0.1%.  In the office today I had a very lengthy discussion with him concerning risk benefits of initiation of anticoagulation therapy to reduce potential stroke particularly if recurrent AF episodes develop.  Of note, this most recent episode occurred while sleeping and today his wife admitted that he has been snoring raising concern for potential obstructive sleep apnea contributing to nocturnal hypoxemia contributing to his atrial fibrillation development.  After long discussion with shared decision making we have made the decision for him to discontinue aspirin and in its place start Eliquis 5 mg twice a day.  He will be continued to be monitored with his loop recorder in place.  I am also scheduling him for an in lab sleep study to assess for sleep apnea.  He tells me he had follow-up laboratory today Dr. Geoffery Lyons office to reassess his previous mild transaminase elevation.  If still elevated, I would suggest slight decrease of rosuvastatin to 20 mg from 40 mg particularly with his outstanding consistent LDL at 32.  He does have multivessel coronary calcification previously documented on his CTA and at cardiac catheterization.  I will see him in 3 to 4 months for follow-up evaluation or sooner as needed.   Geanine Vandekamp A.  Tresa Endo, MD, [facility]   10/08/2022 9:22 AM

## 2022-10-09 ENCOUNTER — Ambulatory Visit (INDEPENDENT_AMBULATORY_CARE_PROVIDER_SITE_OTHER): Payer: Medicare Other

## 2022-10-09 ENCOUNTER — Encounter: Payer: Self-pay | Admitting: Cardiovascular Disease

## 2022-10-09 DIAGNOSIS — R55 Syncope and collapse: Secondary | ICD-10-CM | POA: Diagnosis not present

## 2022-10-13 LAB — CUP PACEART REMOTE DEVICE CHECK
Date Time Interrogation Session: 20240517230635
Implantable Pulse Generator Implant Date: 20201207

## 2022-10-14 NOTE — Progress Notes (Signed)
Carelink Summary Report / Loop Recorder 

## 2022-10-15 ENCOUNTER — Encounter: Payer: Self-pay | Admitting: Family Medicine

## 2022-10-23 NOTE — Progress Notes (Signed)
Carelink Summary Report / Loop Recorder 

## 2022-11-10 ENCOUNTER — Ambulatory Visit (INDEPENDENT_AMBULATORY_CARE_PROVIDER_SITE_OTHER): Payer: Medicare Other

## 2022-11-10 DIAGNOSIS — R55 Syncope and collapse: Secondary | ICD-10-CM | POA: Diagnosis not present

## 2022-11-10 LAB — CUP PACEART REMOTE DEVICE CHECK
Date Time Interrogation Session: 20240616231534
Implantable Pulse Generator Implant Date: 20201207

## 2022-11-14 ENCOUNTER — Ambulatory Visit: Payer: Medicare Other | Admitting: Cardiovascular Disease

## 2022-11-30 ENCOUNTER — Other Ambulatory Visit: Payer: Self-pay | Admitting: Cardiovascular Disease

## 2022-12-01 NOTE — Progress Notes (Signed)
Carelink Summary Report / Loop Recorder 

## 2022-12-01 NOTE — Telephone Encounter (Signed)
Prescription refill request for Eliquis received. Indication:afib Last office visit:5/24 Scr:1.16  2/24 Age: 89 Weight:86.2  kg  Prescription refilled

## 2022-12-11 ENCOUNTER — Ambulatory Visit (INDEPENDENT_AMBULATORY_CARE_PROVIDER_SITE_OTHER): Payer: Medicare Other

## 2022-12-11 DIAGNOSIS — R55 Syncope and collapse: Secondary | ICD-10-CM

## 2022-12-15 LAB — CUP PACEART REMOTE DEVICE CHECK
Date Time Interrogation Session: 20240719230329
Implantable Pulse Generator Implant Date: 20201207

## 2022-12-25 NOTE — Progress Notes (Signed)
Carelink Summary Report / Loop Recorder 

## 2023-01-07 ENCOUNTER — Other Ambulatory Visit: Payer: Self-pay | Admitting: Cardiovascular Disease

## 2023-01-12 ENCOUNTER — Ambulatory Visit (INDEPENDENT_AMBULATORY_CARE_PROVIDER_SITE_OTHER): Payer: Medicare Other

## 2023-01-12 DIAGNOSIS — R55 Syncope and collapse: Secondary | ICD-10-CM | POA: Diagnosis not present

## 2023-01-12 LAB — CUP PACEART REMOTE DEVICE CHECK
Date Time Interrogation Session: 20240818231807
Implantable Pulse Generator Implant Date: 20201207

## 2023-01-22 NOTE — Progress Notes (Signed)
Carelink Summary Report / Loop Recorder 

## 2023-02-12 ENCOUNTER — Ambulatory Visit (INDEPENDENT_AMBULATORY_CARE_PROVIDER_SITE_OTHER): Payer: Medicare Other

## 2023-02-12 DIAGNOSIS — R55 Syncope and collapse: Secondary | ICD-10-CM | POA: Diagnosis not present

## 2023-02-16 LAB — CUP PACEART REMOTE DEVICE CHECK
Date Time Interrogation Session: 20240920230850
Implantable Pulse Generator Implant Date: 20201207

## 2023-02-24 NOTE — Progress Notes (Signed)
Carelink Summary Report / Loop Recorder 

## 2023-03-16 ENCOUNTER — Ambulatory Visit: Payer: Medicare Other

## 2023-03-16 DIAGNOSIS — R55 Syncope and collapse: Secondary | ICD-10-CM | POA: Diagnosis not present

## 2023-03-17 LAB — CUP PACEART REMOTE DEVICE CHECK
Date Time Interrogation Session: 20241020230320
Implantable Pulse Generator Implant Date: 20201207

## 2023-03-31 ENCOUNTER — Ambulatory Visit: Payer: Medicare Other | Attending: Cardiovascular Disease | Admitting: Cardiovascular Disease

## 2023-03-31 ENCOUNTER — Encounter: Payer: Self-pay | Admitting: Cardiovascular Disease

## 2023-03-31 DIAGNOSIS — I350 Nonrheumatic aortic (valve) stenosis: Secondary | ICD-10-CM | POA: Diagnosis present

## 2023-03-31 DIAGNOSIS — I452 Bifascicular block: Secondary | ICD-10-CM | POA: Diagnosis present

## 2023-03-31 DIAGNOSIS — E785 Hyperlipidemia, unspecified: Secondary | ICD-10-CM | POA: Insufficient documentation

## 2023-03-31 DIAGNOSIS — D6869 Other thrombophilia: Secondary | ICD-10-CM | POA: Insufficient documentation

## 2023-03-31 DIAGNOSIS — I2510 Atherosclerotic heart disease of native coronary artery without angina pectoris: Secondary | ICD-10-CM | POA: Diagnosis present

## 2023-03-31 DIAGNOSIS — I480 Paroxysmal atrial fibrillation: Secondary | ICD-10-CM | POA: Insufficient documentation

## 2023-03-31 DIAGNOSIS — I4729 Other ventricular tachycardia: Secondary | ICD-10-CM | POA: Insufficient documentation

## 2023-03-31 NOTE — Progress Notes (Signed)
Patient ID: Eugene [name], male   DOB: February 07, 1933, 89 y.o.   MRN: [id number]     Primary MD: Dr. Mosetta Putt  HPI: Eugene [name] is a 89 y.o. male who presents to the office today for a 6 month cardiology evaluation.  Eugene [name] has a remote history of sarcoidosis and had been followed at [facility] with complete remission. He has a history of mild hyperlipidemia and has been on Vytorin 10/40. He also has a history of BPH on Proscar.  An echo Doppler study which was done to evaluate a systolic murmur in the aortic region noted in December 2013 showed normal systolic function with mild focal basal hypertrophy of the septum. He had normal systolic function. There was evidence for a moderate diffuse calcification involving the noncoronary cusp of a trileaflet aortic valve. He did have sclerosis without stenosis. Valve area was 2.55 cm. RV size was upper normal to mildly dilated. A nuclear perfusion study done in 2013 was unchanged from 6 years previously and continued to show normal perfusion.  Laboratory done by Dr. Eula Listen in August 2015 was normal with a hemoglobin of 14.5, hematocrit 43.8.  Renal function was excellent with a BUN of 19, Cr 0.99.  Fasting glucose was 78.  Lipid studies remained excellent with a total cholesterol 125, triglycerides 14, HDL cholesterol 44, and LDL cholesterol at 60.  Angiotensin converting enzyme was normal at 37 and last year was 33.  He underwent an echo Doppler study in 03/13/2015.  This revealed an ejection fraction at 60-65%. There was grade 1 diastolic dysfunction.  He had normal LV filling pressures.  His aortic valve was calcified and there was evidence for mild aortic stenosis with a mean gradient of 9, peak gradient of 18, and valve area of 1.87 cm and mild aortic insufficiency.  There was mild mitral annular calcification with trivial MR , mild LA dilatation, and moderate TR with PA pressure 30 mm. He underwent right knee replacement  surgery by Dr. Hadassah Pais in November.  Postoperatively had some swelling and lower extremity venous studies were negative.  When I saw him in 2017, he was doing well from a cardiac standpoint.   Specifically, he denied any episodes of chest pain.  He was able to play tennis again following his knee surgery.  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  He had also undergone follow-up CT imaging asked and was found to have a new irregularly-shaped nodular density in the left lower lobe, alt to represent a possible focus of active infection/inflammation.  A follow-up CT was recommended.  After a course of antimicrobial therapy.  His CT also demonstrated a stable ectatic.  Borderline aneurysmal ascending thoracic aorta at 3.9 cm.  He had evidence for coronary calcification.   Laboratory in 2017 by his primary physician showed total cholesterol was 134, triglycerides 79, HDL 43, and LDL 75 on his current dose of ezetimibe 10 mg and simvastatin 40 mg.   When I saw him in March 2019 he was remaining active and was walking on elliptical machine at least 3-4 times per week and was playing tennis 2 times per week.  He denied any chest pain or shortness of breath.  He denies palpitations.   In 2019, he was found to have a pulmonary infiltrate for which she was evaluated by  Dr. Vassie Loll.  He had undergone bronchoscopy with biopsy and ultimately underwent an evaluation at [facility] and this nodule had not significantly changed over the past 16 years.  I evaluated him in May 2020 and a telemedicine visit.  At that time he denied any chest pain, palpitations, presyncope or syncope.  He denied any PND orthopnea.  He had previously been followed by Dr. Link Snuffer but he had recently signed up for concierge medicine with Dr. Mosetta Putt.  During that evaluation I recommended he undergo a  follow-up echo Doppler study to reassess his aortic valve since in 2016 he was found to have mild aortic stenosis.  He underwent an echo Doppler study on December 21, 2018 which continues to show normal systolic function with EF at 60 to 65%, and moderate asymmetric left ventricular hypertrophy.  There was grade 2 diastolic dysfunction.  There was mild aortic stenosis with moderate calcification of the aortic valve.  He had a mean systolic gradient of 11 mm and his peak gradient was 20.8 mm.  Calculated aortic valve was 1.7 cm and was consistent with mild aortic stenosis.  On January 25, 2019 while biking at [address] he apparently had a syncopal spell.  He was found by bystanders.  Apparently he is amnesic to the event and when he woke up he was at [facility] in the emergency room.  It did not appear that he was aware when he had fallen since there was no apparent marks on his hands to break a fall.  A CT scan of the maxillofacial area demonstrated a comminuted displaced bilateral nasal bone fracture as well as fracture of the right zygomatic arch.  There was a frontal scalp hematoma.  He had extensive CT imaging of his chest abdomen and pelvis.  Of note, on his chest CT he was felt to have severe coronary artery atherosclerotic calcifications.  There was no evidence for acute traumatic injury within the chest abdomen or pelvis.  He had a 3.6 cm indeterminate liver mass, 4.3 cm indeterminate left renal lesion and had multiple bladder stones and bladder diverticula likely related to chronic outlet obstruction from an enlarged prostate and had nonobstructive bilateral nephrolithiasis.  Upon his return to [facility], he was evaluated by Antoine Primas in the [facility] primary care.  He subsequently was evaluated by Dr. Janne Napoleon in our office for cardiology evaluation during my absence.  With his memory absence, it is felt that he had a concussion.  When seen by Dr. Cristal Deer, she  reviewed his extensive evaluation and recent echo which confirmed that he did not have significant aortic stenosis.  Because of his remote history of sarcoidosis, she scheduled him to undergo cardiac MRI imaging to assess for scar/infiltrative disease.  He was also scheduled to undergo 14-day Ziotelemetry monitor.    I saw him in follow-up of Dr. Cristal Deer on February 14, 2019.  At the time of his evaluation he had not yet received his Zio patch monitor.  I reviewed extensively his records from [facility] as well as the records of Dr. Cristal Deer.  His MR scan was reviewed and did not show any definitive myocardial late gadolinium enhancement so there was no definitive evidence for prior myocardial infarction, infiltrative disease or myocarditis.  LVEF was approximately 60%.  His RV EF was reported at 35% however when reviewed by Dr. Cristal Deer she had felt in 20 function was somewhat better.  I scheduled him to undergo coronary CTA to assess potential CAD  disease with silent ischemia leading to his clinical event.  He also underwent carotid duplex imaging.Marland Kitchen  His 14-day ZIO monitor showed predominant sinus rhythm with right bundle branch block.  He did have an average heart rate at 62 bpm with a minimum of 42 while sleeping and maximal heart rate of 190.  He had several short bursts of SVT the fastest interval lasting 4 beats with a maximum rate of 190.  The longest SVT episode was 12.8 seconds with an average heart rate of 138 bpm.  There was no VT or atrial fibrillation or high degree block or pauses noted.  Carotid duplex imaging essentially normal with normal antegrade vertebral and subclavian flow and was without evidence for internal carotid stenoses.  I received an initial report of his CT angiogram which came back as normal.  We had notified him of the results.  Apparently, there was an addendum to the report port when it was reinterpreted adjusted multivessel CAD with a calcium score of  1151 with moderate calcific plaque in the mid distal left main, severe calcific plaque in the mid LAD mild calcific plaque in the circumflex and RCA.  There was concern of possible significant blooming artifact which may have contributed to overestimate of LAD stenoses.  Subsequent FFR analysis was normal and did not reveal any hemodynamic significant stenoses.   I had a long conversation with Mr. Vizzini as well as called his daughter in [place] in follow-up of his April 08, 2019 office visit.  At that time I recommended initiation of aspirin 81 mg and initiated metoprolol succinate 12.5 mg daily.  I also discussed changing him to more aggressive lipid-lowering therapy and he recently was started on rosuvastatin 40 mg in place of simvastatin. When I last saw him in the office on April 18, 2019 I recommended definitive cardiac catheterization in light of his coronary CT angio findings also recommended insertion of a loop monitor for potential long-term monitoring of potential cardiac arrhythmia.   Mr. Ribelin underwent cardiac catheterization on May 02, 2019 by me. There was mild to moderate coronary calcification without high-grade obstructive disease. There was a smooth distal tapering of his left main with 30% narrowing, 30% proximal and 20% mid calcified stenoses in the LAD with 40% diffuse stenosis in a small caliber mid LAD vessel. The left circumflex vessel was angiographically normal and dominant. The RCA was a normal nondominant vessel. Of note, there was a small caliber ramus intermediate branch which appeared to fill directly into the left ventricle resulting in left ventricular opacification. He had only very mild aortic stenosis with a transvalvular aortic gradient at 10 to 12 mm. There was mild aortic valve calcification. There that day before going home he underwent insertion of a loop recorder by Dr. Royann Shivers.  I saw him in follow-up of his catheterization and loop recorder on May 19, 2019.  Over the last several weeks, Mr. Millender has felt well. He denies any episodes of chest pain or shortness of breath. Loop recorder interrogation to date has not shown any events.   Since I saw him in December 2020, he has been undergoing monthly device checks of his loop recorder.  He has normal device function.  There are no new symptom episodes or episodes of tachycardia, bradycardia or pauses.  There were no new AF episodes.  He underwent a follow-up echo Doppler study on July 05, 2019.  This essentially is unchanged from his prior evaluation and continues to show hyperdynamic LV function.  He has grade 1 diastolic dysfunction.  There is decreased motion of his left and noncoronary cusp of a trileaflet aortic valve.  Aortic stenosis is mild with a mean gradient of 10.5 and peak gradient of 18.8.  There is moderate aortic insufficiency.  Ascending aorta is mildly dilated at 40 mm.  There is moderate tricuspid regurgitation.  I saw him in February 2021 at which time he continued to feel well.  He specifically denied any chest pain, PND orthopnea, presyncope or syncope or awareness of palpitations.    He underwent recent laboratory now on his regimen of Zetia plus rosuvastatin 40 mg, and LDL cholesterol is now markedly reduced from 72 down to 30 with combination treatment.  There is minimally elevated AST and ALT levels of 45 and 5 respectively.  He remains asymptomatic.  I saw him on February 10, 2020.  He had seen Dr. Mosetta Putt who is his primary provider andunderwent recent laboratory which I was able to obtain today after his office visit and called him to discuss the findings with him.  Laboratory from January 03, 2020 was excellent.  LDL cholesterol was 36.  He underwent particle assessment and LDL particle number was optimal at 905 as was small LDL particles at 138.  April lipoprotein B was excellent at 49 and LP(a) was excellent at 27.  He continues to be active and plays tennis and does  elliptical.  He also rides his bike.  He denies any anginal symptoms.  He continues to have monthly loop recorder checks.  His most recent evaluation did not reveal any events.   He was asymptomatic and continued to be on low-dose metoprolol succinate 12.5 mg daily with stable blood pressure.  I saw him on August 15, 2020.  Since his prior evaluation he had a follow-up echo Doppler study on August 09, 2020.  This essentially was unchanged from previously and showed an EF of 60 to 65% with grade 1 diastolic dysfunction.  There was mild asymmetric LVH at of the basal septal segment.  There was moderate calcification of a trileaflet aortic valve with mild aortic stenosis.  His mean gradient was 10 mmHg with a peak gradient of 18.3.  He continued to be on Zetia and rosuvastatin 40 mg for hyperlipidemia and metoprolol succinate 12.5 mg. He denied any palpitations.  He continues to undergo remote loop recorder check at 85-month intervals.  He has not had any clinically significant events and has normal battery status.   I saw him on January 24, 2021 and since his prior evaluation he continued to be stable.  He specifically denied presyncope or syncope or awareness of palpitations.  He denied any chest pain or exertional dyspnea.  His resting pulse is in the 50s and he remains asymptomatic.  He had undergone laboratory by Dr. Mosetta Putt in December 2021.  Total cholesterol was 102, HDL 45, triglycerides 65, and LDL 43.  Renal function was stable with a creatinine of 1.1.  He continues to be active and rides a bike.  He will be going to [place] at the end of next week.  I reviewed his recent implantable loop recorder downloads.  He has not had any clinically significant events.   I saw him on November 19, 2021 at which time he remained stable.  He continued to play tennis and ride his bike.  He was unaware of any palpitations.  He has not had any arrhythmias noted on his loop recorder interrogations.  He will be  traveling to [place] in [place].  He continues to be on Zetia 10 mg and rosuvastatin 40 mg for aggressive lipid management.  In September 2022 LDL was 38 with total cholesterol 101 and triglycerides 77.  He tells me that Dr. Duaine Dredge checked complete laboratory in January.  I do not have these results and will contact his office so that they can be sent to me for my review.  He continues to be on metoprolol succinate 12.5 mg daily and aspirin 81 mg with his CAD.    When I saw him on May 09, 2022 he continued to be asymptomatic and was still playing tennis.   His most recent loop recorder download for the first time picked up a 15-second period of self-limiting nonsustained VT at a rate of 162 which occurred on December 2 at 6:25 PM.  He was completely asymptomatic and was  unaware of any tachycardia at the time.  He denies any presyncope or syncope.  He denies any chest pain.  He had laboratory by Dr. Duaine Dredge.  During that evaluation, we had a lengthy discussion.  His ECG showed sinus bradycardia and he was taking metoprolol succinate 12.5 mg every other day with his resting heart rate at 57.  I saw him for evaluation on July 09, 2022.  He continued to be asymptomatic.  However on his most recent loop recorder read out, he had experienced an episode of atrial fibrillation on January 21 at 5:56 AM.  He was unaware of this episode but apparently it lasted for 56 minutes and had bursts of increased heart rate up to 156 bpm with average rate of 98.  This occurred while he was sleeping in the early morning.  Since he takes his metoprolol on even days he was.  He presents for evaluation hours since his last dose of 12.5 mg.  Upon questioning, his wife states that he does snore.  He has not been evaluated for atrial fibrillation.  He was contacted to come to the office to discuss potential need for anticoagulation initiation and he is here with his wife today for further discussion  and recommendations.  During that evaluation, I reviewed his loop recorder findings in detail.  I discussed potential thromboembolic risk associated with PAF.  He was completely asymptomatic of this episode.  After much discussion with shared decision making, I recommended he discontinue aspirin and in its place initiate Eliquis 5 mg twice a day.  He will be continued to be monitored with his loop recorder.  I also discussed potential sleep apnea as a contributor to his nocturnal atrial fibrillation.  He had undergone recent laboratory with Dr. Duaine Dredge and we have aggressively been treating his lipids with most recent LDL cholesterol at 32.  I last saw him on Oct 08, 2022. Since his prior evaluation Mr. Gumaer continued to be entirely asymptomatic.  He is tolerating Eliquis without recent bleeding.  For several days he did experience some very mild nosebleeding which resolved.  He never pursued a sleep study.  He continues to work.  He is on metoprolol succinate at 12.5 mg every other day, Zetia 10 mg and rosuvastatin 40 mg in addition to Eliquis 5 mg twice a day.  Most recent renal function was stable with creatinine at 1.13.   Since I last saw him, he he continues to feel well.  His loop recorder has shown stable rhythm with no episodes of atrial fibrillation, tachycardia bradycardia or pauses.  He  continues to play tennis.  He is tolerating Eliquis 5 mg twice a day without bleeding.  He is on Zetia 10 mg and rosuvastatin 40 mg for aggressive lipid-lowering therapy.  Laboratory by Dr. Lynnette Caffey in January 2024 showed total cholesterol 84, triglycerides 84, and LDL cholesterol at 32.  He is here with his wife for follow-up evaluation.     Past Medical History:  Diagnosis Date   Bladder stones    BPH (benign prostatic hyperplasia)    Hernia of abdominal cavity    History of kidney stones    Hyperlipidemia    Incomplete right bundle branch block    Low O2 saturation 04/04/2015   Wife says O2 sats are in  the low 90s base line.  Now in the 80s.  Will do incentive spirometry   Primary localized osteoarthritis of right knee 04/02/2015   Sarcoidosis    diagnosed 8-9 yrs ago   Upper GI bleed 04/04/2015   Patient had coffee ground emesis that was Guiac positive on Monday.  EGD scheduled for 04/04/2015. Started on IV Protonix    Past Surgical History:  Procedure Laterality Date   ANTERIOR CERVICAL DECOMP/DISCECTOMY FUSION  2007   BRONCHOSCOPY  2007   CARDIOVASCULAR STRESS TEST  04/29/2012   normal nuclear stress study.    CYSTOSCOPY  2004   DOPPLER ECHOCARDIOGRAPHY  04/29/2012   EF not noted. small perimembranous ventricular septal defect. small left to right ventricular shunt. moderate diffuse calcification involving noncoronary cusp of the aortic valve.    ESOPHAGOGASTRODUODENOSCOPY  04/04/2015   ESOPHAGOGASTRODUODENOSCOPY N/A 04/04/2015   Procedure: ESOPHAGOGASTRODUODENOSCOPY (EGD);  Surgeon: Ruffin Frederick, MD;  Location: [facility] ENDOSCOPY;  Service: Gastroenterology;  Laterality: N/A;   INGUINAL HERNIA REPAIR Right 2009   LEFT HEART CATH AND CORONARY ANGIOGRAPHY N/A 05/02/2019   Procedure: LEFT HEART CATH AND CORONARY ANGIOGRAPHY;  Surgeon: Lennette Bihari, MD;  Location: [facility] INVASIVE CV LAB;  Service: Cardiovascular;  Laterality: N/A;   LOOP RECORDER INSERTION N/A 05/02/2019   Procedure: LOOP RECORDER INSERTION;  Surgeon: Thurmon Fair, MD;  Location: [facility] INVASIVE CV LAB;  Service: Cardiovascular;  Laterality: N/A;   TOTAL KNEE ARTHROPLASTY Right 04/02/2015   Procedure: TOTAL KNEE ARTHROPLASTY;  Surgeon: Salvatore Marvel, MD;  Location: [facility] OR;  Service: Orthopedics;  Laterality: Right;   VIDEO BRONCHOSCOPY Bilateral 09/23/2017   Procedure: VIDEO BRONCHOSCOPY WITH FLUORO;  Surgeon: Oretha Milch, MD;  Location: [facility] ENDOSCOPY;  Service: Cardiopulmonary;  Laterality: Bilateral;    No Known Allergies  Current Outpatient Medications  Medication Sig Dispense Refill   Cholecalciferol (VITAMIN D) 50  MCG (2000 UT) CAPS Take 2,000 Units by mouth daily.     ELIQUIS 5 MG TABS tablet Take 1 tablet (5 mg total) by mouth 2 (two) times daily. 60 tablet 3   ezetimibe (ZETIA) 10 MG tablet TAKE (1) TABLET DAILY AT BEDTIME. 90 tablet 3   finasteride (PROSCAR) 5 MG tablet Take 5 mg by mouth every evening.      metoprolol succinate (TOPROL-XL) 25 MG 24 hr tablet Take 1/2 tablet (12.5 mg total) by mouth every other day. 23 tablet 3   potassium citrate (UROCIT-K) 10 MEQ (1080 MG) SR tablet Take 10 mEq by mouth daily.     rosuvastatin (CRESTOR) 40 MG tablet Take 40 mg by mouth daily.     No current facility-administered medications for this visit.    Social History   Socioeconomic History   Marital status: Married    Spouse name: Not on file  Number of children: Not on file   Years of education: Not on file   Highest education level: Not on file  Occupational History   Not on file  Tobacco Use   Smoking status: Former    Types: Pipe    Quit date: 05/26/1972    Years since quitting: 50.8   Smokeless tobacco: Never   Tobacco comments:    Quit 43 year  Substance and Sexual Activity   Alcohol use: Yes    Alcohol/week: 4.0 standard drinks of alcohol    Types: 4 Standard drinks or equivalent per week   Drug use: No   Sexual activity: Not on file  Other Topics Concern   Not on file  Social History Narrative   Not on file   Social Determinants of Health   Financial Resource Strain: Not on file  Food Insecurity: Not on file  Transportation Needs: Not on file  Physical Activity: Not on file  Stress: Not on file  Social Connections: Unknown (10/08/2021)   [facility]   Social Network    Social Network: Not on file  Intimate Partner Violence: Not At Risk (11/27/2022)   Received from [facility]   HITS    Over the last 12 months how often did your partner physically hurt you?: Never    Over the last 12 months how often did your partner insult  you or talk down to you?: Never    Over the last 12 months how often did your partner threaten you with physical harm?: Never    Over the last 12 months how often did your partner scream or curse at you?: Never   Socially he is married. He  9 grandchildren. One son lives in [place], a daughter lives in [place]. There is no tobacco use. He does take occasional alcohol. He does exercise.  Family History  Problem Relation Age of Onset   Ovarian cancer Mother    Heart attack Father 100    ROS General: Negative; No fevers, chills, or night sweats;  HEENT: Negative; No changes in vision or hearing, sinus congestion, difficulty swallowing Pulmonary: Negative; No cough, wheezing, shortness of breath, hemoptysis Cardiovascular: See HPI GI: Negative; No nausea, vomiting, diarrhea, or abdominal pain GU: Negative; No dysuria, hematuria, or difficulty voiding Musculoskeletal: Recent bilateral nasal bone fractures and fracture of right zygomatic arch Hematologic/Oncology: Negative; no easy bruising, bleeding Remote history of sarcoidosis, completely resolved Endocrine: Negative; no heat/cold intolerance; no diabetes Neuro: Amnesic to his syncopal spell Skin: Negative; No rashes or skin lesions Psychiatric: Negative; No behavioral problems, depression Sleep: Positive for snoring, no daytime sleepiness, hypersomnolence, bruxism, restless legs, hypnogognic hallucinations, no cataplexy.  He often falls asleep on the couch around 8 PM and wakes up later in the night and often goes back to bed at 1 AM until 6 AM/ Other comprehensive 14 point system review is negative.   PE BP (!) 106/58 (BP Location: Left Arm, Patient Position: Sitting, Cuff Size: Normal)   Pulse (!) 57   Ht 5\' 9"  (1.753 m)   Wt 187 lb (84.8 kg)   BMI 27.62 kg/m    Repeat blood pressure by me was 120/64.  Wt Readings from Last 3 Encounters:  03/31/23 187 lb (84.8 kg)  10/08/22 190 lb (86.2 kg)  07/09/22 185 lb (83.9 kg)    General: Alert, oriented, no distress.  Skin: normal turgor, no rashes, warm and dry HEENT: Normocephalic, atraumatic; sclera anicteric; extraocular muscles intact;  Nose without nasal septal hypertrophy Mouth/Parynx benign; Mallinpatti scale 2 Neck: No JVD, no carotid bruits; normal carotid upstroke Lungs: clear to ausculatation and percussion; no wheezing or rales Chest wall: without tenderness to palpitation Heart: PMI not displaced, RRR, s1 s2 normal, 1/6 systolic murmur, no diastolic murmur, no rubs, gallops, thrills, or heaves Abdomen: soft, nontender; no hepatosplenomehaly, BS+; abdominal aorta nontender and not dilated by palpation. Back: no CVA tenderness Pulses 2+ Musculoskeletal: full range of motion, normal strength, no joint deformities Extremities: no clubbing cyanosis or edema, Homan's sign negative  Neurologic: grossly nonfocal; Cranial nerves grossly wnl Psychologic: Normal mood and affect  EKG Interpretation Date/Time:  Tuesday March 31 2023 08:02:18 EST Ventricular Rate:  57 PR Interval:  148 QRS Duration:  156 QT Interval:  444 QTC Calculation: 432 R Axis:   -29  Text Interpretation: Sinus bradycardia Right bundle branch block Minimal voltage criteria for LVH, may be normal variant ( R in aVL ) When compared with ECG of 26-Jan-2019 18:09, PREVIOUS ECG IS PRESENT Confirmed by Nicki Guadalajara ([id number]) on 04/05/2023 2:26:24 PM    Oct 08, 2022 ECG (independently read by me):  Sinus bradycardia at 56, RBBB/, LVH, no ectopy  July 09, 2022 ECG (independently read by me): Sinus bradycardia at 50 bpm.  Right bundle branch block with repolarization changes.  Left axis deviation, voltage criteria for LVH.  PR interval 174 ms; QTc interval 437 ms.   May 09, 2022 ECG (independently read by me):  Sinus bradycardia at 57, :AD, RBBB, LVH   November 19, 2021 ECG (independently read by me): Sinus bradycardia at 54, LAD, RBBB, LVH  January 24, 2021 ECG (independently  read by me): Sinus bradycardia at 52; RBBB with repolarization, LVH  August 15, 2020 ECG (independently read by me): Sinus bradycardia at 57, RBBB with repolarization changes  September 2021 ECG (independently read by me): Sinus bradycardia at 56, RBBB with repolarization, LVH by voltage; normal intervals, no ectopy  February 12, 20212 ECG (independently read by me): Sinus bradycardia 58 bpm, right bundle branch block with repolarization changes.  Borderline criteria for LVH.  No ectopy.  QTc interval 447 ms  December 2020 ECG (independently read by me): Sinus bradycardia at 54 bpm, right bundle branch block with repolarization changes.  Left axis deviation.  Borderline voltage criteria for LVH  April 08, 2019 ECG (independently read by me): Sinus rhythm at 65 bpm, left axis deviation, right bundle branch block with repolarization changes.  LVH by voltage.  QTc interval 453 ms.  PR interval 146 ms.  September 2020 ECG (independently read by me): Sinus bradycardia at 53 bpm, right bundle branch block with repolarization changes.  QRS duration 156 ms.  Mild LVH by voltage in aVL.  QTc interval 439 ms  March 2019 ECG (independently read by me): normal sinus rhythm at 65 bpm.  One isolated PVC.  Incomplete right bundle branch block.  Borderline LVH.  Normal intervals.  February 2018 ECG (independently read by me): Sinus bradycardia at 56 bpm.  Mild sinus arrhythmia.  Mild LVH.  Normal intervals.  No ST segment changes.  February 2017 ECG (independently read by me):  Normal sinus rhythm at 63 bpm.  Early transition.  No ST segment changes.  December 2015 ECG (independently read by me): Normal sinus rhythm at 66 bpm.  Mild RV conduction delay.  Early transition.  December 2014 ECG: Sinus rhythm with an occasional PVC; normal intervals.  LABS: I personally reviewed the laboratory from [facility]  done on 02/19/2016.  BUN 18, creatinine 0.9.  Ingrown 14.7, hematocrit 44.9.  Normal  LFTs.  TSH 2.08.  April lipoprotein B 66.  Normal PSA.  Lipid studies as noted above.  Lpid studies from March 03, 2017: Total cholesterol 134, HDL 39, LDL 70, triglycerides 124.     After he left the office I was able to obtain accurate from Dr. Duaine Dredge from his blood work on Mr. Nestor from December 2021: Cholesterol 102, HDL 45, triglycerides 65, and LDL 43. Hypoprotein particle analysis small LDL particles excellent at 281. CMP normal. CBC normal. A1c 5.8. Vitamin D 26.3.   I reviewed recent laboratory from Dr. Duaine Dredge on June 03, 2022: Potassium 4.4.  Sodium 138.  Creatinine 1.13.  Mild transaminase elevation with ALT at 66 and AST at 56.  TSH 2.58.  Hemoglobin 14.3/hematocrit 44.8.  Platelets 167 K.  Lipid studies show total cholesterol 84, HDL 36, triglycerides 84, LDL 32.     Latest Ref Rng & Units 02/15/2021    8:12 AM 06/30/2019    8:59 AM 04/28/2019    2:20 PM  BMP  Glucose 65 - 99 mg/dL 83  88  829   BUN 8 - 27 mg/dL 19  16  21    Creatinine 0.76 - 1.27 mg/dL 5.62  1.30  8.65   BUN/Creat Ratio 10 - 24 19  16  23    Sodium 134 - 144 mmol/L 139  138  139   Potassium 3.5 - 5.2 mmol/L 5.0  4.8  4.9   Chloride 96 - 106 mmol/L 101  103  102   CO2 20 - 29 mmol/L 24  23  27    Calcium 8.6 - 10.2 mg/dL 78.4  69.6  29.5       Latest Ref Rng & Units 02/15/2021    8:12 AM 06/30/2019    8:59 AM 03/20/2015    8:57 AM  Hepatic Function  Total Protein 6.0 - 8.5 g/dL 6.6  6.6  6.6   Albumin 3.6 - 4.6 g/dL 4.3  4.3  4.0   AST 0 - 40 IU/L 33  45  29   ALT 0 - 44 IU/L 44  54  30   Alk Phosphatase 44 - 121 IU/L 77  73  68   Total Bilirubin 0.0 - 1.2 mg/dL 0.8  0.7  0.8       Latest Ref Rng & Units 02/15/2021    8:12 AM 06/30/2019    8:59 AM 04/28/2019    2:20 PM  CBC  WBC 3.4 - 10.8 x10E3/uL 8.2  6.7  9.5   Hemoglobin 13.0 - 17.7 g/dL 28.4  13.2  44.0   Hematocrit 37.5 - 51.0 % 43.4  43.6  44.1   Platelets 150 - 450 x10E3/uL 149  146  187    Lab Results  Component Value Date    MCV 92 02/15/2021   MCV 92 06/30/2019   MCV 91 04/28/2019   Additional studies reviewed: I reviewed all the records from [facility] including his CT images of January 25, 2019 following his recent syncopal spell induced  Trauma.  ECHO 7//28/2020 IMPRESSIONS  1. The left ventricle has normal systolic function with an ejection fraction of 60-65%. The cavity size was normal. There is moderate asymmetric left ventricular hypertrophy. Left ventricular diastolic Doppler parameters are consistent with  pseudonormalization.  2. The right ventricle has normal systolic function. The cavity was normal. There is no increase in right ventricular wall  thickness. Right ventricular systolic pressure could not be assessed.  3. Left atrial size was mildly dilated.  4. The aortic valve is abnormal. Moderate calcification of the aortic valve. Aortic valve regurgitation is trivial by color flow Doppler. Mild stenosis with a mean systolic gradient of 11 mmHg of the aortic valve.  5. The aorta is normal in size and structure when indexed to body surface area (ascending aorta measures 38 mm).  CARDIAC MRI 02/11/2019 IMPRESSION: 1. Normal left ventricular size with mild LV hypertrophy. This appears concentric and not asymmetric. EF 60%, normal wall motion.   2. The RV is mildly dilated with mild to moderate hypokinesis, EF 35%.   3. There is no definite myocardial LGE, so no definitive evidence for prior MI, infiltrative disease, or myocarditis.   Abnormal right ventricle. However, there is no definitive evidence for cardiac sarcoidosis or hypertrophic cardiomyoapthy.  ----------------------------------------------------------------------------------------------------------- CTA CORONARY :  IMPRESSION: 03/07/2019  1. Coronary calcium score of 0. This was 0 percentile for age and sex matched control.   2. Normal coronary origin with right dominance.   3. No evidence of CAD.   Armanda Magic  ADDENDUM/ Edited Result :  CTA  Aorta: Normal size. Calcifications of the aortic root and ascending aorta. No dissection.   Aortic Valve:  Trileaflet.  Moderate calcifications.   Coronary Arteries:  Normal coronary origin.  Left dominance.   RCA is a small non dominant artery that gives rise to PDA and PLVB. There is mild calcified plaque in the proximal RCA with associated stenosis of 25-49%. There is motion artifact in the mid and distal RCA.   Left main is a large artery that gives rise to LAD and LCX arteries. There is moderate calcified plaque in the mid to distal LM with associated 50-69% stenosis.   LAD is a large vessel that gives rise to a large D1 and moderate sized D2. There is moderate calcified plaque in the proximal LAD with associated 50-69% stenosis. There is severe calcified plaque in the mid LAD with associated stenosis of 70-99%.   LCX is a large dominant artery that gives rise to one large OM1 branch. There is minimal calcified plaque in the proximal to mid LCx with associated stenosis of 0-24%. There is mild calcified plaque in the mid LCx at the takeoff of a moderate sized branching OM with associated stenosis of 25-49%. There is minimal atherosclerosis of the distal LCx with associated stenosis of 0 - 24%.   Other findings:   Normal pulmonary vein drainage into the left atrium.   Normal let atrial appendage without a thrombus.   Normal size of the pulmonary artery.   IMPRESSION: 1. Coronary calcium score of 1151. This was 66th percentile for age and sex matched control.   2.  Normal coronary origin with left dominance.   3.  Moderate to severe calcified AV cusps.   4.  Atherosclerosis of the aortic root and ascending aorta.   5. Moderate Calcified plaque in the distal LM and severe calcified plaque in the mid LAD. CAD-RADS 4b. Significant blooming may over estimate LAD stenosis.   6.  Recommend aggressive risk factor modification.    7.  Recommend cardiac catheterization.   8.  Study has been sent for [facility] analysis.   Traci Turner  -----------------------------------------------------------------------------------------  FFR FINDINGS: FFRCT analysis was performed on the original cardiac CT angiogram dataset. Diagrammatic representation of the FFRct analysis is provided in a separate PDF document in PACS. This dictation was created  using the PDF document and an interactive 3D model of the results. 3D model is not available in the EMR/PACS. Normal FFR range is >0.80.   1. No significant flow limiting lesion: LM FFR = 0.99.   2. LAD: No significant flow limiting lesion: Proximal FFR = 0.98, Mid FFR = 0.96, Distal FFR = 0.89.   3. LCX: No significant flow limiting lesion: Proximal FFR = 0.97, Mid FFR = 0.94, Distal FFR = 0.91.   4. RCA: No flow limiting lesion in non dominant small RCA: Proximal FFR = 1.00, Mid FFR = 0.97, distal FFR not obtained.   IMPRESSION: 1. CT FFR analysis show no hemodynamically significant flow limiting lesions.   ------------------------------------------------------------------------------------------- CARDIAC CATH 05/02/2019 Mid LM to Dist LM lesion is 30% stenosed. Prox LAD lesion is 30% stenosed. Mid LAD lesion is 20% stenosed. Dist LAD lesion is 40% stenosed.   There is evidence for mild to moderate coronary calcification without high-grade obstructive disease.  There is smooth distal tapering of the left main with 30% narrowing; 30% proximal and 20% mid ossified stenoses in the LAD with 40% diffuse stenosis in a small caliber mid LAD vessel.  The left circumflex vessel is an angiographically normal dominant vessel; the RCA is normal nondominant vessel.  There is a small caliber ramus intermediate branch which appears to fill directly into the left ventricle resulting in ventricular opacification,   Mild transvalvular aortic gradient at 10 to going from the left ventricle  to the aorta consistent with his known very mild aortic valve stenosis.  There is mild aortic valve calcification.   RECOMMENDATION:  The patient will continue on aspirin therapy as well as his low-dose beta-blocker therapy.  A loop recorder will be placed in the short stay following his catheterization with careful close monitoring of his rhythm status.  Continue aggressive lipid-lowering therapy with target LDL ideally in the 50s or below.  The patient was recently switched from simvastatin to rosuvastatin 40 mg.       LOOP RECORDER DEVICE CHECK: 06/08/2019 Carelink summary report received. Battery status OK. Normal device function. No new symptom episodes, tachy episodes, brady, or pause episodes. No new AF episodes. Monthly summary reports and ROV/PRN  ECHO 07/05/2019 IMPRESSIONS   1. Left ventricular ejection fraction, by estimation, is 65 to 70%. The  left ventricle has hyperdynamic function. The left ventrical has no  regional wall motion abnormalities. Left ventricular diastolic parameters  are consistent with Grade I diastolic  dysfunction (impaired relaxation).   2. Right ventricular systolic function is normal. The right ventricular  size is mildly enlarged. There is moderately elevated pulmonary artery  systolic pressure. The estimated right ventricular systolic pressure is  40.9 mmHg.   3. No evidence of mitral valve regurgitation.   4. Tricuspid valve regurgitation is moderate.   5. Decreased motion of left and non coronary cusp. The aortic valve is  tricuspid. Aortic valve regurgitation is moderate. Mild aortic valve  stenosis. Aortic valve area, by VTI measures 1.78 cm. Aortic valve mean  gradient measures 10.5 mmHg. Aortic  valve Vmax measures 2.17 m/s.   6. Aortic dilatation noted. 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   7. The inferior vena cava is normal in size with greater than 50%  respiratory variability, suggesting right atrial  pressure of 3 mmHg.   Comparison(s): No significant change from prior study. Prior images  reviewed side by side.    ECHO 08/09/2020: IMPRESSIONS  1. Left ventricular ejection fraction, by estimation, is 60 to 65%. The  left ventricle has normal function. The left ventricle has no regional  wall motion abnormalities. There is mild asymmetric left ventricular  hypertrophy of the basal-septal segment.  Left ventricular diastolic parameters are consistent with Grade I  diastolic dysfunction (impaired relaxation).   2. Right ventricular systolic function is normal. The right ventricular  size is mildly enlarged.   3. The mitral valve is grossly normal. No evidence of mitral valve  regurgitation. No evidence of mitral stenosis.   4. The aortic valve is tricuspid. There is moderate calcification of the  aortic valve. There is moderate thickening of the aortic valve. Aortic  valve regurgitation is mild. Mild aortic valve stenosis. Aortic valve  area, by VTI measures 3.07 cm. Aortic  valve mean gradient measures 10.0 mmHg. Aortic valve Vmax measures 2.14  m/s.   Comparison(s): No significant change from prior study. LV function  unchanged. Aortic stenosis remains mild. Mild AI on this study.    IMPRESSION:  1. CAD in native artery   2. Paroxysmal atrial fibrillation (HCC)   3. Hyperlipidemia with target low density lipoprotein (LDL) cholesterol less than 70 mg/dL   4. 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 NSVT (nonsustained ventricular tachycardia) (HCC): 04/26/2022   7. Hypercoagulable state due to paroxysmal atrial fibrillation [facility])     ASSESSMENT AND PLAN: Mr. Swagerty is an active 89 year old gentleman who has a remote history of prior sarcoid disease with ultimate complete remisson.  He has a history of mild hyperlipidemia which has been aggressively controlled.  Remotely, he was found to have mild coronary calcification  on CT imaging.  I have been aggressive with lipid therapy in attempt to potentially induce plaque regression and previously he has been on Zetia/simvastatin 10/40 with LDL cholesterols previously documented to be 70.  In 2016 he was found to have aortic stenosis which was mild with mild aortic insufficiency with a mean gradient of 9 and peak gradient of 18.  Around Labor Day in 2020 while [place] the beach [place] on a hot day he had a syncopal spell.  He has no recollection of falling and when he awakened he was in [facility] emergency room.  He was not aware of any rhythm abnormality.  In July 2020  a follow-up echo Doppler study confirmed just mild aortic stenosis with a valve area of 1.7 cm.  It is unlikely that the severity of his aortic stenosis accounts for his syncope.  Recently, his ECG showed definite right bundle branch block which in the past had just been incomplete right bundle.  On his ECG of February 14, 2019 he was in sinus rhythm, although bradycardic at 53 with right bundle branch block.  QTc interval was normal.  I was concerned potential ischemic etiology scheduled him to undergo a coronary CTA for further evaluation of potential significant coronary obstructive disease. I  also scheduling him for carotid duplex imaging to assess his carotid arteries and vertebral flow.  His Zio patch findings demonstrated predominant sinus rhythm but revealed several episodes of supraventricular tachycardia, as well as episodes of isolated ectopic in isolated ventricular ectopy.  His fastest heartbeat was 190 bpm which lasted 4 beats and was consistent with SVT.  The longest SVT episode was 12.8 seconds with an average rate of 138 bpm. His amended CTA report showed a calcium score of 1151 multivessel  CAD and FFR analysis was normal.  Definitive cardiac catheterization demonstrated mild to moderate coronary calcification with nonobstructive plaque in his left main and LAD of approximately 30%. He continues  to remain asymptomatic and is on a more aggressive treatment to attempt plaque regression rosuvastatin 40 mg and Zetia 10 mg.  Lipid studies in February 2021 showed an LDL of 30.  Laboratory by Dr. Duaine Dredge in December 2021 continued to show excellent labs with LDL cholesterol at 43, triglycerides 65 HDL 45 and total cholesterol 102.  LDL particle number was low at 732.  I have continued to be aggressive with lipid-lowering therapy and he is tolerating Zetia 10 mg and rosuvastatin 40 mg combination.  His most recent echo Doppler study in March 2022 essentially was unchanged from his prior evaluation and continued to show an EF of 60 to 65%, grade 1 diastolic dysfunction, mild asymmetric LVH at the basal septum and moderate calcification of a trileaflet aortic valve with mild aortic stenosis (mean gradient 10 and peak gradient 18 mmHg).  Laboratory from September 2022 continue to be excellent with LDL cholesterol of 38 and attempt to induce plaque regression. He has tolerated rosuvastatin 40 mg and Zetia 10 mg.  I  saw him on May 09, 2022 after his loop recorder revealed a brief episode of nonsustained VT lasting 15 seconds at a rate of 162 which occurred at 6:25 PM on April 26, 2022.  He was asymptomatic and did not recall any sense of palpitations.  The analysis was reviewed with Dr. Royann Shivers.  At that time I kept him on his current dose of metoprolol succinate which she was taking 12.5 mg every other day due to bradycardia.  His most recent loop recorder download has demonstrated his first episode of atrial fibrillation which lasted 56 minutes on the morning of June 15, 2021 approximately 23 hours after he had taken the metoprolol dose on January 20.  Apparently there were some bursts up to 156 bpm.  His AF burden was very low at 0.1%.  I worked him in for office evaluation on July 09, 2022.  I reviewed his loop recorder findings in detail and ultimately with shared decision making the decision was  made for him to discontinue aspirin and initiate Eliquis for stroke prevention.  With his normal renal function and weight despite his age he was started on Eliquis 5 mg twice a day.  Subsequent loop recorder monitors since his February assessment have been entirely normal without any ectopy VT or A-fib.  He continues to be asymptomatic.  Presently he continues to be asymptomatic.  His ECG today continues to show mild sinus bradycardia with right bundle branch block.  He believes he is sleeping well.  He is followed by Dr. Duaine Dredge.  Most recent LDL cholesterol was outstanding at 32 on Zetia 10 mg in addition to rosuvastatin 40 mg.  He continues to be on low-dose metoprolol succinate 12.5 mg every other day and is without palpitations or recurrent AF.  There is no bleeding on Eliquis.  He believes he is sleeping well and remotely we had discussed possible sleep evaluation which he wished to defer.  I have recommended he undergo a follow-up echo Doppler study in March 2026 for reassessment of LV function and his aortic valve murmur.  I will see him in April 2026 for follow-up evaluation.   Lennette Bihari, MD, [facility]  04/05/2023 2:34 PM

## 2023-03-31 NOTE — Patient Instructions (Addendum)
Medication Instructions:  No  changes  *If you need a refill on your cardiac medications before your next appointment, please call your pharmacy*   Lab Work: Not needed    Testing/Procedures: Will be schedule at [facility] [address]- March 2025 Your physician has requested that you have an echocardiogram. Echocardiography is a painless test that uses sound waves to create images of your heart. It provides your doctor with information about the size and shape of your heart and how well your heart's chambers and valves are working. This procedure takes approximately one hour. There are no restrictions for this procedure. Please do NOT wear cologne, perfume, aftershave, or lotions (deodorant is allowed). Please arrive 15 minutes prior to your appointment time.  Please note: We ask at that you not bring children with you during ultrasound (echo/ vascular) testing. Due to room size and safety concerns, children are not allowed in the ultrasound rooms during exams. Our front office staff cannot provide observation of children in our lobby area while testing is being conducted. An adult accompanying a patient to their appointment will only be allowed in the ultrasound room at the discretion of the ultrasound technician under special circumstances. We apologize for any inconvenience.    Follow-Up: At [facility], you and your health needs are our priority.  As part of our continuing mission to provide you with exceptional heart care, we have created designated Provider Care Teams.  These Care Teams include your primary Cardiologist (physician) and Advanced Practice Providers (APPs -  Physician Assistants and Nurse Practitioners) who all work together to provide you with the care you need, when you need it.     Your next appointment:   5 month(s)  The format for your next appointment:   In Person  Provider:   Nicki Guadalajara, MD

## 2023-04-02 NOTE — Progress Notes (Signed)
Carelink Summary Report / Loop Recorder 

## 2023-04-05 ENCOUNTER — Encounter: Payer: Self-pay | Admitting: Cardiovascular Disease

## 2023-04-12 ENCOUNTER — Other Ambulatory Visit: Payer: Self-pay | Admitting: Cardiovascular Disease

## 2023-04-13 NOTE — Telephone Encounter (Signed)
Prescription refill request for Eliquis received. Indication:afib Last office visit:11/24 Scr:1.16  2/24 Age: 89 Weight:84.8  kg  Prescription refilled

## 2023-04-16 ENCOUNTER — Ambulatory Visit (INDEPENDENT_AMBULATORY_CARE_PROVIDER_SITE_OTHER): Payer: Medicare Other

## 2023-04-16 DIAGNOSIS — I452 Bifascicular block: Secondary | ICD-10-CM

## 2023-04-16 LAB — CUP PACEART REMOTE DEVICE CHECK
Date Time Interrogation Session: 20241120230204
Implantable Pulse Generator Implant Date: 20201207

## 2023-05-12 NOTE — Progress Notes (Signed)
Carelink Summary Report / Loop Recorder 

## 2023-05-21 ENCOUNTER — Ambulatory Visit (INDEPENDENT_AMBULATORY_CARE_PROVIDER_SITE_OTHER): Payer: Medicare Other

## 2023-05-21 DIAGNOSIS — R55 Syncope and collapse: Secondary | ICD-10-CM | POA: Diagnosis not present

## 2023-05-25 LAB — CUP PACEART REMOTE DEVICE CHECK
Date Time Interrogation Session: 20241225230022
Implantable Pulse Generator Implant Date: 20201207

## 2023-06-24 LAB — LAB REPORT - SCANNED
A1c: 6.1
EGFR: 55
PSA, Total: 1.02

## 2023-06-25 ENCOUNTER — Ambulatory Visit: Payer: Medicare Other

## 2023-06-25 DIAGNOSIS — R55 Syncope and collapse: Secondary | ICD-10-CM

## 2023-06-25 LAB — CUP PACEART REMOTE DEVICE CHECK
Date Time Interrogation Session: 20250129230036
Implantable Pulse Generator Implant Date: 20201207

## 2023-06-27 ENCOUNTER — Encounter: Payer: Self-pay | Admitting: Cardiovascular Disease

## 2023-07-13 NOTE — Progress Notes (Signed)
 Acknowledged.

## 2023-07-30 ENCOUNTER — Ambulatory Visit: Payer: Medicare Other

## 2023-07-30 DIAGNOSIS — R55 Syncope and collapse: Secondary | ICD-10-CM | POA: Diagnosis not present

## 2023-07-31 NOTE — Progress Notes (Signed)
 Carelink Summary Report / Loop Recorder

## 2023-08-01 LAB — CUP PACEART REMOTE DEVICE CHECK
Date Time Interrogation Session: 20250305230118
Implantable Pulse Generator Implant Date: 20201207

## 2023-08-02 ENCOUNTER — Encounter: Payer: Self-pay | Admitting: Cardiovascular Disease

## 2023-08-05 ENCOUNTER — Ambulatory Visit (HOSPITAL_COMMUNITY): Payer: Medicare Other | Attending: Cardiovascular Disease

## 2023-08-05 DIAGNOSIS — I350 Nonrheumatic aortic (valve) stenosis: Secondary | ICD-10-CM | POA: Diagnosis not present

## 2023-08-05 DIAGNOSIS — I452 Bifascicular block: Secondary | ICD-10-CM | POA: Insufficient documentation

## 2023-08-05 DIAGNOSIS — I2510 Atherosclerotic heart disease of native coronary artery without angina pectoris: Secondary | ICD-10-CM | POA: Diagnosis not present

## 2023-08-05 DIAGNOSIS — I480 Paroxysmal atrial fibrillation: Secondary | ICD-10-CM | POA: Diagnosis not present

## 2023-08-05 LAB — ECHOCARDIOGRAM COMPLETE
AR max vel: 1.32 cm2
AV Area VTI: 1.27 cm2
AV Area mean vel: 1.23 cm2
AV Mean grad: 13 mmHg
AV Peak grad: 24.4 mmHg
Ao pk vel: 2.47 m/s
Area-P 1/2: 2.4 cm2
P 1/2 time: 563 ms
S' Lateral: 2.5 cm

## 2023-08-12 ENCOUNTER — Telehealth: Payer: Self-pay | Admitting: Cardiovascular Disease

## 2023-08-12 NOTE — Telephone Encounter (Signed)
 Patient stated he is returning staff call.

## 2023-08-12 NOTE — Telephone Encounter (Signed)
   Per chart review patient contacted  No further nursing outreach needed at this time

## 2023-09-03 ENCOUNTER — Ambulatory Visit (INDEPENDENT_AMBULATORY_CARE_PROVIDER_SITE_OTHER): Payer: Medicare Other

## 2023-09-03 DIAGNOSIS — I452 Bifascicular block: Secondary | ICD-10-CM

## 2023-09-03 LAB — CUP PACEART REMOTE DEVICE CHECK
Date Time Interrogation Session: 20250409230231
Implantable Pulse Generator Implant Date: 20201207

## 2023-09-04 NOTE — Addendum Note (Signed)
 Addended by: Elease Etienne A on: 09/04/2023 11:55 AM   Modules accepted: Orders

## 2023-09-04 NOTE — Progress Notes (Signed)
 Carelink Summary Report / Loop Recorder

## 2023-09-12 ENCOUNTER — Encounter: Payer: Self-pay | Admitting: Cardiovascular Disease

## 2023-09-14 ENCOUNTER — Encounter: Payer: Self-pay | Admitting: Cardiovascular Disease

## 2023-09-14 ENCOUNTER — Ambulatory Visit: Payer: Medicare Other | Attending: Cardiovascular Disease | Admitting: Cardiovascular Disease

## 2023-09-14 DIAGNOSIS — I480 Paroxysmal atrial fibrillation: Secondary | ICD-10-CM | POA: Insufficient documentation

## 2023-09-14 DIAGNOSIS — I452 Bifascicular block: Secondary | ICD-10-CM | POA: Insufficient documentation

## 2023-09-14 DIAGNOSIS — I4729 Other ventricular tachycardia: Secondary | ICD-10-CM | POA: Insufficient documentation

## 2023-09-14 DIAGNOSIS — I350 Nonrheumatic aortic (valve) stenosis: Secondary | ICD-10-CM | POA: Insufficient documentation

## 2023-09-14 DIAGNOSIS — I2510 Atherosclerotic heart disease of native coronary artery without angina pectoris: Secondary | ICD-10-CM | POA: Insufficient documentation

## 2023-09-14 DIAGNOSIS — E785 Hyperlipidemia, unspecified: Secondary | ICD-10-CM | POA: Insufficient documentation

## 2023-09-14 DIAGNOSIS — D6869 Other thrombophilia: Secondary | ICD-10-CM | POA: Insufficient documentation

## 2023-09-14 NOTE — Patient Instructions (Signed)
 Medication Instructions:  NO CHANGES *If you need a refill on your cardiac medications before your next appointment, please call your pharmacy*  Lab Work: NO LABS If you have labs (blood work) drawn today and your tests are completely normal, you will receive your results only by: MyChart Message (if you have MyChart) OR A paper copy in the mail If you have any lab test that is abnormal or we need to change your treatment, we will call you to review the results.  Testing/Procedures: NO TESTING  Follow-Up: At [facility], you and your health needs are our priority.  As part of our continuing mission to provide you with exceptional heart care, our providers are all part of one team.  This team includes your primary Cardiologist (physician) and Advanced Practice Providers or APPs (Physician Assistants and Nurse Practitioners) who all work together to provide you with the care you need, when you need it.  Your next appointment:   6 month(s)  Provider:   Luana Rumple, MD    Other Instructions   1st Floor: - Lobby - Registration  - Pharmacy  - Lab - Cafe  2nd Floor: - PV Lab - Diagnostic Testing (echo, CT, nuclear med)  3rd Floor: - Vacant  4th Floor: - TCTS (cardiothoracic surgery) - AFib Clinic - Structural Heart Clinic - Vascular Surgery  - Vascular Ultrasound  5th Floor: - [facility] (general and EP) - Clinical Pharmacy for coumadin, hypertension, lipid, weight-loss medications, and med management appointments    Valet parking services will be available as well.

## 2023-09-14 NOTE — Progress Notes (Signed)
 Patient ID: Eugene [name], male   DOB: 18-Mar-1933, 89 y.o.   MRN: [id number]     Primary MD: Dr. Candise Chambers  HPI: [name] is a 89 y.o. male who presents to the office today for a 6 month cardiology evaluation.  [name] has a remote history of sarcoidosis and had been followed at [facility] with complete remission. He has a history of mild hyperlipidemia and has been on Vytorin  10/40. He also has a history of BPH on Proscar .  An echo Doppler study which was done to evaluate a systolic murmur in the aortic region noted in December 2013 showed normal systolic function with mild focal basal hypertrophy of the septum. He had normal systolic function. There was evidence for a moderate diffuse calcification involving the noncoronary cusp of a trileaflet aortic valve. He did have sclerosis without stenosis. Valve area was 2.55 cm. RV size was upper normal to mildly dilated. A nuclear perfusion study done in 2013 was unchanged from 6 years previously and continued to show normal perfusion.  Laboratory done by Dr. Lorena Rolling in August 2015 was normal with a hemoglobin of 14.5, hematocrit 43.8.  Renal function was excellent with a BUN of 19, Cr 0.99.  Fasting glucose was 78.  Lipid studies remained excellent with a total cholesterol 125, triglycerides 14, HDL cholesterol 44, and LDL cholesterol at 60.  Angiotensin converting enzyme was normal at 37 and last year was 33.  He underwent an echo Doppler study in 03/13/2015.  This revealed an ejection fraction at 60-65%. There was grade 1 diastolic dysfunction.  He had normal LV filling pressures.  His aortic valve was calcified and there was evidence for mild aortic stenosis with a mean gradient of 9, peak gradient of 18, and valve area of 1.87 cm and mild aortic insufficiency.  There was mild mitral annular calcification with trivial MR , mild LA dilatation, and moderate TR with PA pressure 30 mm. He underwent right knee replacement  surgery by Dr. Judieth Nova in November.  Postoperatively had some swelling and lower extremity venous studies were negative.  When I saw him in 2017, he was doing well from a cardiac standpoint.   Specifically, he denied any episodes of chest pain.  He was able to play tennis again following his knee surgery.  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  He had also undergone follow-up CT imaging asked and was found to have a new irregularly-shaped nodular density in the left lower lobe, alt to represent a possible focus of active infection/inflammation.  A follow-up CT was recommended.  After a course of antimicrobial therapy.  His CT also demonstrated a stable ectatic.  Borderline aneurysmal ascending thoracic aorta at 3.9 cm.  He had evidence for coronary calcification.   Laboratory in 2017 by his primary physician showed total cholesterol was 134, triglycerides 79, HDL 43, and LDL 75 on his current dose of ezetimibe  10 mg and simvastatin  40 mg.   When I saw him in March 2019 he was remaining active and was walking on elliptical machine at least 3-4 times per week and was playing tennis 2 times per week.  He denied any chest pain or shortness of breath.  He denies palpitations.   In 2019, he was found to have a pulmonary infiltrate for which she was evaluated by  Dr. Alva.  He had undergone bronchoscopy with biopsy and ultimately underwent an evaluation at [facility] and this nodule had not significantly changed over the past 16 years.  I evaluated him in May 2020 and a telemedicine visit.  At that time he denied any chest pain, palpitations, presyncope or syncope.  He denied any PND orthopnea.  He had previously been followed by Dr. Jolee Naval but he had recently signed up for concierge medicine with Dr. Candise Chambers.  During that evaluation I recommended he undergo a  follow-up echo Doppler study to reassess his aortic valve since in 2016 he was found to have mild aortic stenosis.  He underwent an echo Doppler study on December 21, 2018 which continues to show normal systolic function with EF at 60 to 65%, and moderate asymmetric left ventricular hypertrophy.  There was grade 2 diastolic dysfunction.  There was mild aortic stenosis with moderate calcification of the aortic valve.  He had a mean systolic gradient of 11 mm and his peak gradient was 20.8 mm.  Calculated aortic valve was 1.7 cm and was consistent with mild aortic stenosis.  On January 25, 2019 while biking at [address] he apparently had a syncopal spell.  He was found by bystanders.  Apparently he is amnesic to the event and when he woke up he was at [facility] in the emergency room.  It did not appear that he was aware when he had fallen since there was no apparent marks on his hands to break a fall.  A CT scan of the maxillofacial area demonstrated a comminuted displaced bilateral nasal bone fracture as well as fracture of the right zygomatic arch.  There was a frontal scalp hematoma.  He had extensive CT imaging of his chest abdomen and pelvis.  Of note, on his chest CT he was felt to have severe coronary artery atherosclerotic calcifications.  There was no evidence for acute traumatic injury within the chest abdomen or pelvis.  He had a 3.6 cm indeterminate liver mass, 4.3 cm indeterminate left renal lesion and had multiple bladder stones and bladder diverticula likely related to chronic outlet obstruction from an enlarged prostate and had nonobstructive bilateral nephrolithiasis.  Upon his return to [facility], he was evaluated by Ronnell Coins in the [place] primary care.  He subsequently was evaluated by Dr. Janeece Mechanic in our office for cardiology evaluation during my absence.  With his memory absence, it is felt that he had a concussion.  When seen by Dr. Veryl Gottron, she  reviewed his extensive evaluation and recent echo which confirmed that he did not have significant aortic stenosis.  Because of his remote history of sarcoidosis, she scheduled him to undergo cardiac MRI imaging to assess for scar/infiltrative disease.  He was also scheduled to undergo 14-day Ziotelemetry monitor.    I saw him in follow-up of Dr. Veryl Gottron on February 14, 2019.  At the time of his evaluation he had not yet received his Zio patch monitor.  I reviewed extensively his records from [facility] as well as the records of Dr. Veryl Gottron.  His MR scan was reviewed and did not show any definitive myocardial late gadolinium enhancement so there was no definitive evidence for prior myocardial infarction, infiltrative disease or myocarditis.  LVEF was approximately 60%.  His RV EF was reported at 35% however when reviewed by Dr. Veryl Gottron she had felt in 20 function was somewhat better.  I scheduled him to undergo coronary CTA to assess potential CAD  disease with silent ischemia leading to his clinical event.  He also underwent carotid duplex imaging.Aaron Aas  His 14-day ZIO monitor showed predominant sinus rhythm with right bundle branch block.  He did have an average heart rate at 62 bpm with a minimum of 42 while sleeping and maximal heart rate of 190.  He had several short bursts of SVT the fastest interval lasting 4 beats with a maximum rate of 190.  The longest SVT episode was 12.8 seconds with an average heart rate of 138 bpm.  There was no VT or atrial fibrillation or high degree block or pauses noted.  Carotid duplex imaging essentially normal with normal antegrade vertebral and subclavian flow and was without evidence for internal carotid stenoses.  I received an initial report of his CT angiogram which came back as normal.  We had notified him of the results.  Apparently, there was an addendum to the report port when it was reinterpreted adjusted multivessel CAD with a calcium  score of  1151 with moderate calcific plaque in the mid distal left main, severe calcific plaque in the mid LAD mild calcific plaque in the circumflex and RCA.  There was concern of possible significant blooming artifact which may have contributed to overestimate of LAD stenoses.  Subsequent FFR analysis was normal and did not reveal any hemodynamic significant stenoses.   I had a long conversation with Mr. Sermon as well as called his daughter in [place] in follow-up of his April 08, 2019 office visit.  At that time I recommended initiation of aspirin  81 mg and initiated metoprolol  succinate 12.5 mg daily.  I also discussed changing him to more aggressive lipid-lowering therapy and he recently was started on rosuvastatin  40 mg in place of simvastatin . When I last saw him in the office on April 18, 2019 I recommended definitive cardiac catheterization in light of his coronary CT angio findings also recommended insertion of a loop monitor for potential long-term monitoring of potential cardiac arrhythmia.   Mr. Malacara underwent cardiac catheterization on May 02, 2019 by me. There was mild to moderate coronary calcification without high-grade obstructive disease. There was a smooth distal tapering of his left main with 30% narrowing, 30% proximal and 20% mid calcified stenoses in the LAD with 40% diffuse stenosis in a small caliber mid LAD vessel. The left circumflex vessel was angiographically normal and dominant. The RCA was a normal nondominant vessel. Of note, there was a small caliber ramus intermediate branch which appeared to fill directly into the left ventricle resulting in left ventricular opacification. He had only very mild aortic stenosis with a transvalvular aortic gradient at 10 to 12 mm. There was mild aortic valve calcification. There that day before going home he underwent insertion of a loop recorder by Dr. Alvis Ba.  I saw him in follow-up of his catheterization and loop recorder on May 19, 2019.  Over the last several weeks, Mr. Costlow has felt well. He denies any episodes of chest pain or shortness of breath. Loop recorder interrogation to date has not shown any events.   Since I saw him in December 2020, he has been undergoing monthly device checks of his loop recorder.  He has normal device function.  There are no new symptom episodes or episodes of tachycardia, bradycardia or pauses.  There were no new AF episodes.  He underwent a follow-up echo Doppler study on July 05, 2019.  This essentially is unchanged from his prior evaluation and continues to show hyperdynamic LV function.  He has grade 1 diastolic dysfunction.  There is decreased motion of his left and noncoronary cusp of a trileaflet aortic valve.  Aortic stenosis is mild with a mean gradient of 10.5 and peak gradient of 18.8.  There is moderate aortic insufficiency.  Ascending aorta is mildly dilated at 40 mm.  There is moderate tricuspid regurgitation.  I saw him in February 2021 at which time he continued to feel well.  He specifically denied any chest pain, PND orthopnea, presyncope or syncope or awareness of palpitations.    He underwent recent laboratory now on his regimen of Zetia  plus rosuvastatin  40 mg, and LDL cholesterol is now markedly reduced from 72 down to 30 with combination treatment.  There is minimally elevated AST and ALT levels of 45 and 5 respectively.  He remains asymptomatic.  I saw him on February 10, 2020.  He had seen Dr. Candise Chambers who is his primary provider andunderwent recent laboratory which I was able to obtain today after his office visit and called him to discuss the findings with him.  Laboratory from January 03, 2020 was excellent.  LDL cholesterol was 36.  He underwent particle assessment and LDL particle number was optimal at 905 as was small LDL particles at 138.  April lipoprotein B was excellent at 49 and LP(a) was excellent at 27.  He continues to be active and plays tennis and does  elliptical.  He also rides his bike.  He denies any anginal symptoms.  He continues to have monthly loop recorder checks.  His most recent evaluation did not reveal any events.   He was asymptomatic and continued to be on low-dose metoprolol  succinate 12.5 mg daily with stable blood pressure.  I saw him on August 15, 2020.  Since his prior evaluation he had a follow-up echo Doppler study on August 09, 2020.  This essentially was unchanged from previously and showed an EF of 60 to 65% with grade 1 diastolic dysfunction.  There was mild asymmetric LVH at of the basal septal segment.  There was moderate calcification of a trileaflet aortic valve with mild aortic stenosis.  His mean gradient was 10 mmHg with a peak gradient of 18.3.  He continued to be on Zetia  and rosuvastatin  40 mg for hyperlipidemia and metoprolol  succinate 12.5 mg. He denied any palpitations.  He continues to undergo remote loop recorder check at 22-month intervals.  He has not had any clinically significant events and has normal battery status.   I saw him on January 24, 2021 and since his prior evaluation he continued to be stable.  He specifically denied presyncope or syncope or awareness of palpitations.  He denied any chest pain or exertional dyspnea.  His resting pulse is in the 50s and he remains asymptomatic.  He had undergone laboratory by Dr. Candise Chambers in December 2021.  Total cholesterol was 102, HDL 45, triglycerides 65, and LDL 43.  Renal function was stable with a creatinine of 1.1.  He continues to be active and rides a bike.  He will be going to [place] at the end of next week.  I reviewed his recent implantable loop recorder downloads.  He has not had any clinically significant events.   I saw him on November 19, 2021 at which time he remained stable.  He continued to play tennis and ride his bike.  He was unaware of any palpitations.  He has not had any arrhythmias noted on his loop recorder interrogations.  He will be  traveling to [place] in [place].  He continues to be on Zetia  10 mg and rosuvastatin  40 mg for aggressive lipid management.  In September 2022 LDL was 38 with total cholesterol 101 and triglycerides 77.  He tells me that Dr. Vela Gerhard checked complete laboratory in January.  I do not have these results and will contact his office so that they can be sent to me for my review.  He continues to be on metoprolol  succinate 12.5 mg daily and aspirin  81 mg with his CAD.    When I saw him on May 09, 2022 he continued to be asymptomatic and was still playing tennis.   His most recent loop recorder download for the first time picked up a 15-second period of self-limiting nonsustained VT at a rate of 162 which occurred on December 2 at 6:25 PM.  He was completely asymptomatic and was  unaware of any tachycardia at the time.  He denies any presyncope or syncope.  He denies any chest pain.  He had laboratory by Dr. Vela Gerhard.  During that evaluation, we had a lengthy discussion.  His ECG showed sinus bradycardia and he was taking metoprolol  succinate 12.5 mg every other day with his resting heart rate at 57.  I saw him for evaluation on July 09, 2022.  He continued to be asymptomatic.  However on his most recent loop recorder read out, he had experienced an episode of atrial fibrillation on January 21 at 5:56 AM.  He was unaware of this episode but apparently it lasted for 56 minutes and had bursts of increased heart rate up to 156 bpm with average rate of 98.  This occurred while he was sleeping in the early morning.  Since he takes his metoprolol  on even days he was.  He presents for evaluation hours since his last dose of 12.5 mg.  Upon questioning, his wife states that he does snore.  He has not been evaluated for atrial fibrillation.  He was contacted to come to the office to discuss potential need for anticoagulation initiation and he is here with his wife today for further discussion  and recommendations.  During that evaluation, I reviewed his loop recorder findings in detail.  I discussed potential thromboembolic risk associated with PAF.  He was completely asymptomatic of this episode.  After much discussion with shared decision making, I recommended he discontinue aspirin  and in its place initiate Eliquis  5 mg twice a day.  He will be continued to be monitored with his loop recorder.  I also discussed potential sleep apnea as a contributor to his nocturnal atrial fibrillation.  He had undergone recent laboratory with Dr. Vela Gerhard and we have aggressively been treating his lipids with most recent LDL cholesterol at 32.  I  saw him on Oct 08, 2022. Since his prior evaluation Mr. Flatt continued to be entirely asymptomatic.  He is tolerating Eliquis  without recent bleeding.  For several days he did experience some very mild nosebleeding which resolved.  He never pursued a sleep study.  He continues to work.  He is on metoprolol  succinate at 12.5 mg every other day, Zetia  10 mg and rosuvastatin  40 mg in addition to Eliquis  5 mg twice a day.  Most recent renal function was stable with creatinine at 1.13.   I last saw him on March 31, 2019 for at which time he continued to feel well.  His loop recorder has shown stable rhythm with no episodes of atrial fibrillation,  tachycardia bradycardia or pauses.  He continues to play tennis.  He is tolerating Eliquis  5 mg twice a day without bleeding.  He is on Zetia  10 mg and rosuvastatin  40 mg for aggressive lipid-lowering therapy.  Laboratory by Dr. Myrle Aspen in January 2024 showed total cholesterol 84, triglycerides 84, and LDL cholesterol at 32.    Since I last saw him, he underwent complete laboratory by Dr. Candise Chambers.  CBC was stable.  Creatinine was 1.26.  GFR was 5.  Hemoglobin A1c was 6.1.  Lipids were excellent with total cholesterol 93 HDL 44 triglycerides 57 and LDL cholesterol 36.  He continues to be active and plays tennis.  He is  unaware of any atrial fibrillation.  He denies any presyncope or syncope.  He denies any  chest pain or awareness of palpitations.  He underwent a follow-up echo Doppler study on August 05, 2023 which showed normal LV function with EF 60 to 65%.  There was mild asymmetric LVH.  Left atrium was moderately dilated and there was mild to moderate dilation of his right atrium.  He had mild MR, moderate TR, and mild to moderate aortic stenosis with a mean gradient of 13, peak gradient of 24.4 and aortic valve area at 1.27 cm.  I reviewed his most recent loop recorder from March 6 through September 03, 2023 which was stable without heart rate irregularity.  He presents for evaluation.  Past Medical History:  Diagnosis Date   Bladder stones    BPH (benign prostatic hyperplasia)    Hernia of abdominal cavity    History of kidney stones    Hyperlipidemia    Incomplete right bundle branch block    Low O2 saturation 04/04/2015   Wife says O2 sats are in the low 90s base line.  Now in the 80s.  Will do incentive spirometry   Primary localized osteoarthritis of right knee 04/02/2015   Sarcoidosis    diagnosed 8-9 yrs ago   Upper GI bleed 04/04/2015   Patient had coffee ground emesis that was Guiac positive on Monday.  EGD scheduled for 04/04/2015. Started on IV Protonix     Past Surgical History:  Procedure Laterality Date   ANTERIOR CERVICAL DECOMP/DISCECTOMY FUSION  2007   BRONCHOSCOPY  2007   CARDIOVASCULAR STRESS TEST  04/29/2012   normal nuclear stress study.    CYSTOSCOPY  2004   DOPPLER ECHOCARDIOGRAPHY  04/29/2012   EF not noted. small perimembranous ventricular septal defect. small left to right ventricular shunt. moderate diffuse calcification involving noncoronary cusp of the aortic valve.    ESOPHAGOGASTRODUODENOSCOPY  04/04/2015   ESOPHAGOGASTRODUODENOSCOPY N/A 04/04/2015   Procedure: ESOPHAGOGASTRODUODENOSCOPY (EGD);  Surgeon: Danette Duos, MD;  Location: [facility] ENDOSCOPY;  Service:  Gastroenterology;  Laterality: N/A;   INGUINAL HERNIA REPAIR Right 2009   LEFT HEART CATH AND CORONARY ANGIOGRAPHY N/A 05/02/2019   Procedure: LEFT HEART CATH AND CORONARY ANGIOGRAPHY;  Surgeon: Millicent Ally, MD;  Location: [facility] INVASIVE CV LAB;  Service: Cardiovascular;  Laterality: N/A;   LOOP RECORDER INSERTION N/A 05/02/2019   Procedure: LOOP RECORDER INSERTION;  Surgeon: Luana Rumple, MD;  Location: [facility] INVASIVE CV LAB;  Service: Cardiovascular;  Laterality: N/A;   TOTAL KNEE ARTHROPLASTY Right 04/02/2015   Procedure: TOTAL KNEE ARTHROPLASTY;  Surgeon: Elly Habermann, MD;  Location: [facility] OR;  Service: Orthopedics;  Laterality: Right;   VIDEO BRONCHOSCOPY Bilateral 09/23/2017   Procedure: VIDEO BRONCHOSCOPY WITH FLUORO;  Surgeon: Lind Repine, MD;  Location: [facility] ENDOSCOPY;  Service: Cardiopulmonary;  Laterality: Bilateral;    No Known Allergies  Current Outpatient Medications  Medication Sig Dispense Refill   apixaban  (ELIQUIS ) 5 MG TABS tablet Take 1 tablet (5 mg total) by mouth 2 (two) times daily. 60 tablet 5   Cholecalciferol (VITAMIN D ) 50 MCG (2000 UT) CAPS Take 2,000 Units by mouth daily.     ezetimibe  (ZETIA ) 10 MG tablet TAKE (1) TABLET DAILY AT BEDTIME. 90 tablet 3   finasteride  (PROSCAR ) 5 MG tablet Take 5 mg by mouth every evening.      metoprolol  succinate (TOPROL -XL) 25 MG 24 hr tablet Take 1/2 tablet (12.5 mg total) by mouth every other day. 23 tablet 3   potassium citrate  (UROCIT-K ) 10 MEQ (1080 MG) SR tablet Take 10 mEq by mouth daily.     rosuvastatin  (CRESTOR ) 40 MG tablet Take 40 mg by mouth daily.     No current facility-administered medications for this visit.    Social History   Socioeconomic History   Marital status: Married    Spouse name: Not on file   Number of children: Not on file   Years of education: Not on file   Highest education level: Not on file  Occupational History   Not on file  Tobacco Use   Smoking status: Former    Types: Pipe    Quit  date: 05/26/1972    Years since quitting: 51.3   Smokeless tobacco: Never   Tobacco comments:    Quit 43 year  Substance and Sexual Activity   Alcohol  use: Yes    Alcohol /week: 4.0 standard drinks of alcohol     Types: 4 Standard drinks or equivalent per week   Drug use: No   Sexual activity: Not on file  Other Topics Concern   Not on file  Social History Narrative   Not on file   Social Drivers of Health   Financial Resource Strain: Not on file  Food Insecurity: Not on file  Transportation Needs: Not on file  Physical Activity: Not on file  Stress: Not on file  Social Connections: Unknown (10/08/2021)   [facility], [facility]   Social Network    Social Network: Not on file  Intimate Partner Violence: Not At Risk (11/27/2022)   [facility], [facility]   HITS    Over the last 12 months how often did your partner physically hurt you?: Never    Over the last 12 months how often did your partner insult you or talk down to you?: Never    Over the last 12 months how often did your partner threaten you with physical harm?: Never    Over the last 12 months how often did your partner scream or curse at you?: Never   Socially he is married. He  9 grandchildren. One son lives in [place], a daughter lives in [place]. There is no tobacco use. He does take occasional alcohol . He does exercise.  Family History  Problem Relation Age of Onset   Ovarian cancer Mother    Heart attack Father 100    ROS General: Negative; No fevers, chills, or night sweats;  HEENT: Negative; No changes in vision or hearing, sinus congestion, difficulty swallowing Pulmonary: Negative; No cough, wheezing, shortness of breath, hemoptysis Cardiovascular: See HPI GI: Negative; No nausea, vomiting, diarrhea, or abdominal pain GU: Negative; No dysuria, hematuria, or difficulty voiding Musculoskeletal: Recent bilateral nasal bone fractures and fracture of right zygomatic  arch Hematologic/Oncology: Negative; no easy bruising, bleeding Remote history  of sarcoidosis, completely resolved Endocrine: Negative; no heat/cold intolerance; no diabetes Neuro: Amnesic to his syncopal spell Skin: Negative; No rashes or skin lesions Psychiatric: Negative; No behavioral problems, depression Sleep: Positive for snoring, no daytime sleepiness, hypersomnolence, bruxism, restless legs, hypnogognic hallucinations, no cataplexy.  He often falls asleep on the couch around 8 PM and wakes up later in the night and often goes back to bed at 1 AM until 6 AM/ Other comprehensive 14 point system review is negative.   PE BP (!) 118/58 (BP Location: Left Arm, Patient Position: Sitting, Cuff Size: Normal)   Pulse (!) 57   Ht 5\' 9"  (1.753 m)   Wt 186 lb 3.2 oz (84.5 kg)   SpO2 94%   BMI 27.50 kg/m    Repeat blood pressure by me was 128/64  Wt Readings from Last 3 Encounters:  09/14/23 186 lb 3.2 oz (84.5 kg)  03/31/23 187 lb (84.8 kg)  10/08/22 190 lb (86.2 kg)   General: Alert, oriented, no distress.  Skin: normal turgor, no rashes, warm and dry HEENT: Normocephalic, atraumatic. Pupils equal round and reactive to light; sclera anicteric; extraocular muscles intact;  Nose without nasal septal hypertrophy Mouth/Parynx benign; Mallinpatti scale 2 Neck: No JVD, no carotid bruits; normal carotid upstroke Lungs: clear to ausculatation and percussion; no wheezing or rales Chest wall: without tenderness to palpitation Heart: PMI not displaced, RRR, s1 s2 normal, 1/6 systolic murmur, no diastolic murmur, no rubs, gallops, thrills, or heaves Abdomen: soft, nontender; no hepatosplenomehaly, BS+; abdominal aorta nontender and not dilated by palpation. Back: no CVA tenderness Pulses 2+ Musculoskeletal: full range of motion, normal strength, no joint deformities Extremities: no clubbing cyanosis or edema, Homan's sign negative  Neurologic: grossly nonfocal; Cranial nerves grossly  wnl Psychologic: Normal mood and affect  EKG Interpretation Date/Time:  Monday September 14 2023 13:51:46 EDT Ventricular Rate:  58 PR Interval:  158 QRS Duration:  160 QT Interval:  436 QTC Calculation: 428 R Axis:   -33  Text Interpretation: Sinus bradycardia Left axis deviation Right bundle branch block Minimal voltage criteria for LVH, may be normal variant ( R in aVL ) Septal infarct , age undetermined When compared with ECG of 31-Mar-2023 08:02, No significant change was found Confirmed by Magnus Schuller ([id number]) on 09/14/2023 5:09:11 PM    March 31, 2023 ECG (independently read by me): Sinus bradycardia at 57, right bundle branch block,  Oct 08, 2022 ECG (independently read by me):  Sinus bradycardia at 56, RBBB/, LVH, no ectopy  July 09, 2022 ECG (independently read by me): Sinus bradycardia at 50 bpm.  Right bundle branch block with repolarization changes.  Left axis deviation, voltage criteria for LVH.  PR interval 174 ms; QTc interval 437 ms.   May 09, 2022 ECG (independently read by me):  Sinus bradycardia at 57, :AD, RBBB, LVH   November 19, 2021 ECG (independently read by me): Sinus bradycardia at 54, LAD, RBBB, LVH  January 24, 2021 ECG (independently read by me): Sinus bradycardia at 52; RBBB with repolarization, LVH  August 15, 2020 ECG (independently read by me): Sinus bradycardia at 57, RBBB with repolarization changes  September 2021 ECG (independently read by me): Sinus bradycardia at 56, RBBB with repolarization, LVH by voltage; normal intervals, no ectopy  February 12, 20212 ECG (independently read by me): Sinus bradycardia 58 bpm, right bundle branch block with repolarization changes.  Borderline criteria for LVH.  No ectopy.  QTc interval 447 ms  December 2020 ECG (independently read by  me): Sinus bradycardia at 54 bpm, right bundle branch block with repolarization changes.  Left axis deviation.  Borderline voltage criteria for LVH  April 08, 2019 ECG  (independently read by me): Sinus rhythm at 65 bpm, left axis deviation, right bundle branch block with repolarization changes.  LVH by voltage.  QTc interval 453 ms.  PR interval 146 ms.  September 2020 ECG (independently read by me): Sinus bradycardia at 53 bpm, right bundle branch block with repolarization changes.  QRS duration 156 ms.  Mild LVH by voltage in aVL.  QTc interval 439 ms  March 2019 ECG (independently read by me): normal sinus rhythm at 65 bpm.  One isolated PVC.  Incomplete right bundle branch block.  Borderline LVH.  Normal intervals.  February 2018 ECG (independently read by me): Sinus bradycardia at 56 bpm.  Mild sinus arrhythmia.  Mild LVH.  Normal intervals.  No ST segment changes.  February 2017 ECG (independently read by me):  Normal sinus rhythm at 63 bpm.  Early transition.  No ST segment changes.  December 2015 ECG (independently read by me): Normal sinus rhythm at 66 bpm.  Mild RV conduction delay.  Early transition.  December 2014 ECG: Sinus rhythm with an occasional PVC; normal intervals.  LABS: I personally reviewed the laboratory from [facility] done on 02/19/2016.  BUN 18, creatinine 0.9.  Ingrown 14.7, hematocrit 44.9.  Normal LFTs.  TSH 2.08.  April lipoprotein B 66.  Normal PSA.  Lipid studies as noted above.  Lpid studies from March 03, 2017: Total cholesterol 134, HDL 39, LDL 70, triglycerides 124.     After he left the office I was able to obtain accurate from Dr. Vela Gerhard from his blood work on Mr. Rideaux from December 2021: Cholesterol 102, HDL 45, triglycerides 65, and LDL 43. Hypoprotein particle analysis small LDL particles excellent at 281. CMP normal. CBC normal. A1c 5.8. Vitamin D  26.3.   I reviewed recent laboratory from Dr. Vela Gerhard on June 03, 2022: Potassium 4.4.  Sodium 138.  Creatinine 1.13.  Mild transaminase elevation with ALT at 66 and AST at 56.  TSH 2.58.  Hemoglobin 14.3/hematocrit 44.8.  Platelets 167 K.   Lipid studies show total cholesterol 84, HDL 36, triglycerides 84, LDL 32.     Latest Ref Rng & Units 02/15/2021    8:12 AM 06/30/2019    8:59 AM 04/28/2019    2:20 PM  BMP  Glucose 65 - 99 mg/dL 83  88  409   BUN 8 - 27 mg/dL 19  16  21    Creatinine 0.76 - 1.27 mg/dL 8.11  9.14  7.82   BUN/Creat Ratio 10 - 24 19  16  23    Sodium 134 - 144 mmol/L 139  138  139   Potassium 3.5 - 5.2 mmol/L 5.0  4.8  4.9   Chloride 96 - 106 mmol/L 101  103  102   CO2 20 - 29 mmol/L 24  23  27    Calcium  8.6 - 10.2 mg/dL 95.6  21.3  08.6       Latest Ref Rng & Units 02/15/2021    8:12 AM 06/30/2019    8:59 AM 03/20/2015    8:57 AM  Hepatic Function  Total Protein 6.0 - 8.5 g/dL 6.6  6.6  6.6   Albumin 3.6 - 4.6 g/dL 4.3  4.3  4.0   AST 0 - 40 IU/L 33  45  29   ALT 0 - 44 IU/L 44  54  30   Alk Phosphatase 44 - 121 IU/L 77  73  68   Total Bilirubin 0.0 - 1.2 mg/dL 0.8  0.7  0.8       Latest Ref Rng & Units 02/15/2021    8:12 AM 06/30/2019    8:59 AM 04/28/2019    2:20 PM  CBC  WBC 3.4 - 10.8 x10E3/uL 8.2  6.7  9.5   Hemoglobin 13.0 - 17.7 g/dL 16.1  09.6  04.5   Hematocrit 37.5 - 51.0 % 43.4  43.6  44.1   Platelets 150 - 450 x10E3/uL 149  146  187    Lab Results  Component Value Date   MCV 92 02/15/2021   MCV 92 06/30/2019   MCV 91 04/28/2019   Additional studies reviewed: I reviewed all the records from [facility] including his CT images of January 25, 2019 following his recent syncopal spell induced  Trauma.  ECHO 7//28/2020 IMPRESSIONS  1. The left ventricle has normal systolic function with an ejection fraction of 60-65%. The cavity size was normal. There is moderate asymmetric left ventricular hypertrophy. Left ventricular diastolic Doppler parameters are consistent with  pseudonormalization.  2. The right ventricle has normal systolic function. The cavity was normal. There is no increase in right ventricular wall thickness. Right ventricular systolic pressure could not be  assessed.  3. Left atrial size was mildly dilated.  4. The aortic valve is abnormal. Moderate calcification of the aortic valve. Aortic valve regurgitation is trivial by color flow Doppler. Mild stenosis with a mean systolic gradient of 11 mmHg of the aortic valve.  5. The aorta is normal in size and structure when indexed to body surface area (ascending aorta measures 38 mm).  CARDIAC MRI 02/11/2019 IMPRESSION: 1. Normal left ventricular size with mild LV hypertrophy. This appears concentric and not asymmetric. EF 60%, normal wall motion.   2. The RV is mildly dilated with mild to moderate hypokinesis, EF 35%.   3. There is no definite myocardial LGE, so no definitive evidence for prior MI, infiltrative disease, or myocarditis.   Abnormal right ventricle. However, there is no definitive evidence for cardiac sarcoidosis or hypertrophic cardiomyoapthy.  ----------------------------------------------------------------------------------------------------------- CTA CORONARY :  IMPRESSION: 03/07/2019  1. Coronary calcium  score of 0. This was 0 percentile for age and sex matched control.   2. Normal coronary origin with right dominance.   3. No evidence of CAD.   Gaylyn Keas  ADDENDUM/ Edited Result :  CTA  Aorta: Normal size. Calcifications of the aortic root and ascending aorta. No dissection.   Aortic Valve:  Trileaflet.  Moderate calcifications.   Coronary Arteries:  Normal coronary origin.  Left dominance.   RCA is a small non dominant artery that gives rise to PDA and PLVB. There is mild calcified plaque in the proximal RCA with associated stenosis of 25-49%. There is motion artifact in the mid and distal RCA.   Left main is a large artery that gives rise to LAD and LCX arteries. There is moderate calcified plaque in the mid to distal LM with associated 50-69% stenosis.   LAD is a large vessel that gives rise to a large D1 and moderate sized D2. There is moderate  calcified plaque in the proximal LAD with associated 50-69% stenosis. There is severe calcified plaque in the mid LAD with associated stenosis of 70-99%.   LCX is a large dominant artery that gives rise to one large OM1 branch. There is minimal calcified plaque  in the proximal to mid LCx with associated stenosis of 0-24%. There is mild calcified plaque in the mid LCx at the takeoff of a moderate sized branching OM with associated stenosis of 25-49%. There is minimal atherosclerosis of the distal LCx with associated stenosis of 0 - 24%.   Other findings:   Normal pulmonary vein drainage into the left atrium.   Normal let atrial appendage without a thrombus.   Normal size of the pulmonary artery.   IMPRESSION: 1. Coronary calcium  score of 1151. This was 66th percentile for age and sex matched control.   2.  Normal coronary origin with left dominance.   3.  Moderate to severe calcified AV cusps.   4.  Atherosclerosis of the aortic root and ascending aorta.   5. Moderate Calcified plaque in the distal LM and severe calcified plaque in the mid LAD. CAD-RADS 4b. Significant blooming may over estimate LAD stenosis.   6.  Recommend aggressive risk factor modification.   7.  Recommend cardiac catheterization.   8.  Study has been sent for [facility] analysis.   Traci Turner  -----------------------------------------------------------------------------------------  FFR FINDINGS: FFRCT analysis was performed on the original cardiac CT angiogram dataset. Diagrammatic representation of the FFRct analysis is provided in a separate PDF document in PACS. This dictation was created using the PDF document and an interactive 3D model of the results. 3D model is not available in the EMR/PACS. Normal FFR range is >0.80.   1. No significant flow limiting lesion: LM FFR = 0.99.   2. LAD: No significant flow limiting lesion: Proximal FFR = 0.98, Mid FFR = 0.96, Distal FFR = 0.89.   3. LCX: No  significant flow limiting lesion: Proximal FFR = 0.97, Mid FFR = 0.94, Distal FFR = 0.91.   4. RCA: No flow limiting lesion in non dominant small RCA: Proximal FFR = 1.00, Mid FFR = 0.97, distal FFR not obtained.   IMPRESSION: 1. CT FFR analysis show no hemodynamically significant flow limiting lesions.   ------------------------------------------------------------------------------------------- CARDIAC CATH 05/02/2019 Mid LM to Dist LM lesion is 30% stenosed. Prox LAD lesion is 30% stenosed. Mid LAD lesion is 20% stenosed. Dist LAD lesion is 40% stenosed.   There is evidence for mild to moderate coronary calcification without high-grade obstructive disease.  There is smooth distal tapering of the left main with 30% narrowing; 30% proximal and 20% mid ossified stenoses in the LAD with 40% diffuse stenosis in a small caliber mid LAD vessel.  The left circumflex vessel is an angiographically normal dominant vessel; the RCA is normal nondominant vessel.  There is a small caliber ramus intermediate branch which appears to fill directly into the left ventricle resulting in ventricular opacification,   Mild transvalvular aortic gradient at 10 to going from the left ventricle to the aorta consistent with his known very mild aortic valve stenosis.  There is mild aortic valve calcification.   RECOMMENDATION:  The patient will continue on aspirin  therapy as well as his low-dose beta-blocker therapy.  A loop recorder will be placed in the short stay following his catheterization with careful close monitoring of his rhythm status.  Continue aggressive lipid-lowering therapy with target LDL ideally in the 50s or below.  The patient was recently switched from simvastatin  to rosuvastatin  40 mg.       LOOP RECORDER DEVICE CHECK: 06/08/2019 [facility] summary report received. Battery status OK. Normal device function. No new symptom episodes, tachy episodes, brady, or pause episodes. No new AF  episodes.  Monthly summary reports and ROV/PRN  ECHO 07/05/2019 IMPRESSIONS   1. Left ventricular ejection fraction, by estimation, is 65 to 70%. The  left ventricle has hyperdynamic function. The left ventrical has no  regional wall motion abnormalities. Left ventricular diastolic parameters  are consistent with Grade I diastolic  dysfunction (impaired relaxation).   2. Right ventricular systolic function is normal. The right ventricular  size is mildly enlarged. There is moderately elevated pulmonary artery  systolic pressure. The estimated right ventricular systolic pressure is  40.9 mmHg.   3. No evidence of mitral valve regurgitation.   4. Tricuspid valve regurgitation is moderate.   5. Decreased motion of left and non coronary cusp. The aortic valve is  tricuspid. Aortic valve regurgitation is moderate. Mild aortic valve  stenosis. Aortic valve area, by VTI measures 1.78 cm. Aortic valve mean  gradient measures 10.5 mmHg. Aortic  valve Vmax measures 2.17 m/s.   6. Aortic dilatation noted. 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   7. The inferior vena cava is normal in size with greater than 50%  respiratory variability, suggesting right atrial pressure of 3 mmHg.   Comparison(s): No significant change from prior study. Prior images  reviewed side by side.    ECHO 08/09/2020: IMPRESSIONS   1. Left ventricular ejection fraction, by estimation, is 60 to 65%. The  left ventricle has normal function. The left ventricle has no regional  wall motion abnormalities. There is mild asymmetric left ventricular  hypertrophy of the basal-septal segment.  Left ventricular diastolic parameters are consistent with Grade I  diastolic dysfunction (impaired relaxation).   2. Right ventricular systolic function is normal. The right ventricular  size is mildly enlarged.   3. The mitral valve is grossly normal. No evidence of mitral valve  regurgitation. No evidence of  mitral stenosis.   4. The aortic valve is tricuspid. There is moderate calcification of the  aortic valve. There is moderate thickening of the aortic valve. Aortic  valve regurgitation is mild. Mild aortic valve stenosis. Aortic valve  area, by VTI measures 3.07 cm. Aortic  valve mean gradient measures 10.0 mmHg. Aortic valve Vmax measures 2.14  m/s.   Comparison(s): No significant change from prior study. LV function  unchanged. Aortic stenosis remains mild. Mild AI on this study.   ECHO: 08/05/2023  1. Left ventricular ejection fraction, by estimation, is 60 to 65%. Left  ventricular ejection fraction by 3D volume is 63 %. The left ventricle has  normal function. The left ventricle has no regional wall motion  abnormalities. There is mild asymmetric  left ventricular hypertrophy of the basal-septal segment. Left ventricular  diastolic parameters were normal. The average left ventricular global  longitudinal strain is -26.4 %. The global longitudinal strain is normal.   2. Right ventricular systolic function is normal. The right ventricular  size is normal.   3. Left atrial size was moderately dilated.   4. Right atrial size was mild to moderately dilated.   5. The mitral valve is normal in structure. Mild mitral valve  regurgitation. No evidence of mitral stenosis.   6. Tricuspid valve regurgitation is moderate.   7. The aortic valve is tricuspid. There is moderate calcification of the  aortic valve. Aortic valve regurgitation is mild. Mild to moderate aortic  valve stenosis. Aortic valve area, by VTI measures 1.27 cm. Aortic valve  mean gradient measures 13.0  mmHg. Aortic valve Vmax measures 2.47 m/s.   8. Aortic dilatation  noted. There is borderline dilatation of the  ascending aorta, measuring 39 mm.     IMPRESSION:  1. CAD in native artery   2. 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 Mild  aortic stenosis   5. Hyperlipidemia with target low density lipoprotein (LDL) cholesterol less than 70 mg/dL   6. Hyperlipidemia with target LDL less than 70   7. NSVT (nonsustained ventricular tachycardia) (HCC): 04/26/2022   8. Hypercoagulable state due to paroxysmal atrial fibrillation [facility])     ASSESSMENT AND PLAN: Mr. Gamero is an active 89 year old gentleman who has a remote history of prior sarcoid disease with ultimate complete remisson.  He has a history of mild hyperlipidemia which has been aggressively controlled.  Remotely, he was found to have mild coronary calcification on CT imaging.  I have been aggressive with lipid therapy in attempt to potentially induce plaque regression and previously he has been on Zetia /simvastatin  10/40 with LDL cholesterols previously documented to be 70.  In 2016 he was found to have aortic stenosis which was mild with mild aortic insufficiency with a mean gradient of 9 and peak gradient of 18.  Around Labor Day in 2020 while at the beach riding [place] bike on a hot day he had a syncopal spell.  He has no recollection of falling and when he awakened he was in [facility] emergency room.  He was not aware of any rhythm abnormality.  In July 2020  a follow-up echo Doppler study confirmed just mild aortic stenosis with a valve area of 1.7 cm.  It is unlikely that the severity of his aortic stenosis accounts for his syncope.  Recently, his ECG showed definite right bundle branch block which in the past had just been incomplete right bundle.  On his ECG of February 14, 2019 he was in sinus rhythm, although bradycardic at 53 with right bundle branch block.  QTc interval was normal.  I was concerned potential ischemic etiology scheduled him to undergo a coronary CTA for further evaluation of potential significant coronary obstructive disease. I  also scheduling him for carotid duplex imaging to assess his carotid arteries and vertebral flow.  His Zio patch findings  demonstrated predominant sinus rhythm but revealed several episodes of supraventricular tachycardia, as well as episodes of isolated ectopic in isolated ventricular ectopy.  His fastest heartbeat was 190 bpm which lasted 4 beats and was consistent with SVT.  The longest SVT episode was 12.8 seconds with an average rate of 138 bpm. His amended CTA report showed a calcium  score of 1151 multivessel CAD and FFR analysis was normal.  Definitive cardiac catheterization demonstrated mild to moderate coronary calcification with nonobstructive plaque in his left main and LAD of approximately 30%. He continues to remain asymptomatic and is on a more aggressive treatment to attempt plaque regression rosuvastatin  40 mg and Zetia  10 mg.  Lipid studies in February 2021 showed an LDL of 30.  Laboratory by Dr. Vela Gerhard in December 2021 continued to show excellent labs with LDL cholesterol at 43, triglycerides 65 HDL 45 and total cholesterol 102.  LDL particle number was low at 732.  I have continued to be aggressive with lipid-lowering therapy and he is tolerating Zetia  10 mg and rosuvastatin  40 mg combination.  His most recent echo Doppler study in March 2022 essentially was unchanged from his prior evaluation and continued to show an EF of 60 to 65%, grade 1 diastolic dysfunction,  mild asymmetric LVH at the basal septum and moderate calcification of a trileaflet aortic valve with mild aortic stenosis (mean gradient 10 and peak gradient 18 mmHg).  Laboratory from September 2022 continue to be excellent with LDL cholesterol of 38 and attempt to induce plaque regression. He has tolerated rosuvastatin  40 mg and Zetia  10 mg.  I saw him on May 09, 2022 after his loop recorder revealed a brief episode of nonsustained VT lasting 15 seconds at a rate of 162 which occurred at 6:25 PM on April 26, 2022.  He was asymptomatic and did not recall any sense of palpitations.  The analysis was reviewed with Dr. Alvis Ba.  At that time I  kept him on his current dose of metoprolol  succinate which he was taking 12.5 mg every other day due to bradycardia.  A subsequent loop recorder download has demonstrated his first episode of atrial fibrillation which lasted 56 minutes on the morning of June 15, 2021 approximately 23 hours after he had taken the metoprolol  dose on January 20.  Apparently there were some bursts up to 156 bpm.  His AF burden was very low at 0.1%.  I worked him in for office evaluation on July 09, 2022.  I reviewed his loop recorder findings in detail and ultimately with shared decision making the decision was made for him to discontinue aspirin  and initiate Eliquis  for stroke prevention.  With his normal renal function and weight despite his age he was started on Eliquis  5 mg twice a day.  Subsequent loop recorder monitors since his February assessment have been entirely normal without any ectopy VT or A-fib.  He continues to be asymptomatic.  He remains active and still plays tennis.  I reviewed his most recent echo Doppler study from August 05, 2023 which has remained relatively stable.  EF was 60 to 65%.  There was mild asymmetric LVH.  There was moderate dilation of left atrium and mild to moderate dilation of the right atrium.  He had mild MR, moderate TR, and mild to moderate aortic stenosis with mean gradient 13, peak gradient 24.4, and estimated AVA at 1.27 cm.  Clinically he continues to do well.  Blood pressure today is stable and repeat by me was 128/64.  ECG is stable showing sinus rhythm with previously noted right bundle branch block.  He has not had any bleeding on Eliquis .  Lipid studies are excellent on Zetia  10 mg and rosuvastatin  40 mg.  He takes finasteride  for his prostate.  With his bradycardia he has only been taking metoprolol  succinate 12.5 mg every other day and is doing well.  He is aware of my upcoming retirement.  Dr. Alvis Ba has been following his implantable loop recorder.  He is aware of my  upcoming retirement and I will transition him to the care of Dr. Alvis Ba for cardiology follow-up     Millicent Ally, MD, [facility]  09/14/2023 5:19 PM

## 2023-09-25 ENCOUNTER — Telehealth: Payer: Self-pay | Admitting: Cardiovascular Disease

## 2023-09-25 NOTE — Telephone Encounter (Signed)
 Caller [employer]) wants a call back regarding getting patient scheduled for a CT Angio test.

## 2023-09-25 NOTE — Telephone Encounter (Signed)
 Returned call to [employer] with [facility] she wanted to know if Dr.Kelly ordered chest ct at last visit.Advised Dr.Kelly did not order any test.

## 2023-10-08 ENCOUNTER — Ambulatory Visit (INDEPENDENT_AMBULATORY_CARE_PROVIDER_SITE_OTHER): Payer: Medicare Other

## 2023-10-08 ENCOUNTER — Ambulatory Visit: Payer: Self-pay | Admitting: Cardiovascular Disease

## 2023-10-08 DIAGNOSIS — I452 Bifascicular block: Secondary | ICD-10-CM

## 2023-10-08 LAB — CUP PACEART REMOTE DEVICE CHECK
Date Time Interrogation Session: 20250515005716
Implantable Pulse Generator Implant Date: 20201207

## 2023-10-16 NOTE — Progress Notes (Signed)
 Carelink Summary Report / Loop Recorder

## 2023-10-22 ENCOUNTER — Other Ambulatory Visit: Payer: Self-pay | Admitting: Cardiovascular Disease

## 2023-10-22 NOTE — Telephone Encounter (Signed)
 Prescription refill request for Eliquis  received. Indication: afib  Last office visit: 09/14/2023 Scr: 1.26, 06/24/2023 Age: 89 yo  Weight: 84.5 kg   Refill sent.

## 2023-11-09 ENCOUNTER — Ambulatory Visit

## 2023-11-09 DIAGNOSIS — I452 Bifascicular block: Secondary | ICD-10-CM | POA: Diagnosis not present

## 2023-11-09 LAB — CUP PACEART REMOTE DEVICE CHECK
Date Time Interrogation Session: 20250614230345
Implantable Pulse Generator Implant Date: 20201207

## 2023-11-17 NOTE — Progress Notes (Signed)
 Carelink Summary Report / Loop Recorder

## 2023-11-18 ENCOUNTER — Ambulatory Visit: Payer: Self-pay | Admitting: Cardiovascular Disease

## 2023-12-10 ENCOUNTER — Ambulatory Visit: Payer: Self-pay | Admitting: Cardiovascular Disease

## 2023-12-10 ENCOUNTER — Ambulatory Visit (INDEPENDENT_AMBULATORY_CARE_PROVIDER_SITE_OTHER)

## 2023-12-10 DIAGNOSIS — I452 Bifascicular block: Secondary | ICD-10-CM | POA: Diagnosis not present

## 2023-12-10 LAB — CUP PACEART REMOTE DEVICE CHECK
Date Time Interrogation Session: 20250716230557
Implantable Pulse Generator Implant Date: 20201207

## 2023-12-17 NOTE — Progress Notes (Signed)
 Carelink Summary Report / Loop Recorder

## 2023-12-23 ENCOUNTER — Other Ambulatory Visit: Payer: Self-pay | Admitting: Family Medicine

## 2023-12-23 DIAGNOSIS — I7121 Aneurysm of the ascending aorta, without rupture: Secondary | ICD-10-CM

## 2024-01-01 ENCOUNTER — Ambulatory Visit
Admission: RE | Admit: 2024-01-01 | Discharge: 2024-01-01 | Disposition: A | Discharge status: Home / Self Care | Source: Ambulatory Visit | Attending: Family Medicine | Admitting: Family Medicine

## 2024-01-01 ENCOUNTER — Encounter: Payer: Self-pay | Admitting: Radiology

## 2024-01-01 DIAGNOSIS — I7121 Aneurysm of the ascending aorta, without rupture: Secondary | ICD-10-CM

## 2024-01-01 MED ORDER — IOPAMIDOL (ISOVUE-370) INJECTION 76%
75.0000 mL | Freq: Once | INTRAVENOUS | Status: AC | PRN
  Administered 2024-01-01: 75 mL via INTRAVENOUS

## 2024-01-11 ENCOUNTER — Other Ambulatory Visit: Payer: Self-pay | Admitting: General Practice

## 2024-01-11 ENCOUNTER — Ambulatory Visit (INDEPENDENT_AMBULATORY_CARE_PROVIDER_SITE_OTHER)

## 2024-01-11 DIAGNOSIS — I452 Bifascicular block: Secondary | ICD-10-CM | POA: Diagnosis not present

## 2024-01-12 ENCOUNTER — Ambulatory Visit: Payer: Self-pay | Admitting: Cardiovascular Disease

## 2024-01-12 ENCOUNTER — Other Ambulatory Visit: Payer: Self-pay | Admitting: General Practice

## 2024-01-12 LAB — CUP PACEART REMOTE DEVICE CHECK
Date Time Interrogation Session: 20250816231332
Implantable Pulse Generator Implant Date: 20201207

## 2024-01-13 MED ORDER — METOPROLOL SUCCINATE ER 25 MG PO TB24: 12.5000 mg | ORAL_TABLET | ORAL | 2 refills | Status: AC

## 2024-02-11 ENCOUNTER — Ambulatory Visit (INDEPENDENT_AMBULATORY_CARE_PROVIDER_SITE_OTHER)

## 2024-02-11 ENCOUNTER — Ambulatory Visit: Payer: Self-pay | Admitting: Cardiovascular Disease

## 2024-02-11 DIAGNOSIS — I452 Bifascicular block: Secondary | ICD-10-CM

## 2024-02-11 LAB — CUP PACEART REMOTE DEVICE CHECK
Date Time Interrogation Session: 20250917230523
Implantable Pulse Generator Implant Date: 20201207

## 2024-02-16 NOTE — Progress Notes (Signed)
 Remote Loop Recorder Transmission

## 2024-02-17 NOTE — Progress Notes (Signed)
 Remote Loop Recorder Transmission

## 2024-02-26 ENCOUNTER — Ambulatory Visit: Attending: Cardiovascular Disease | Admitting: Cardiovascular Disease

## 2024-02-26 VITALS — BP 130/60 | HR 50 | Ht 69.0 in | Wt 181.0 lb

## 2024-02-26 DIAGNOSIS — I350 Nonrheumatic aortic (valve) stenosis: Secondary | ICD-10-CM

## 2024-02-26 DIAGNOSIS — D6869 Other thrombophilia: Secondary | ICD-10-CM

## 2024-02-26 DIAGNOSIS — I2510 Atherosclerotic heart disease of native coronary artery without angina pectoris: Secondary | ICD-10-CM

## 2024-02-26 DIAGNOSIS — I480 Paroxysmal atrial fibrillation: Secondary | ICD-10-CM

## 2024-02-26 DIAGNOSIS — R7303 Prediabetes: Secondary | ICD-10-CM

## 2024-02-26 DIAGNOSIS — E785 Hyperlipidemia, unspecified: Secondary | ICD-10-CM | POA: Diagnosis present

## 2024-02-26 NOTE — Progress Notes (Signed)
 Cardiology Office Note   Date:  02/28/2024  ID:  Eugene [name], DOB [birth year], MRN [id number] PCP: Windy Coy, MD  [facility] Providers Cardiologist:  Jerel Balding, MD     Chief Complaint  Patient presents with   Coronary Artery Disease  Transition of cardiology care from Dr. Charlena Sor.   History of Present Illness Eugene [name] is a 89 y.o. male with a history of nonobstructive coronary atherosclerosis (calcium  score 1151, no significant stenoses by CT FFR, maximum stenosis 40% mid LAD by invasive angiography 2020) mild ascending aortic dilation, mild aortic stenosis, hypercholesterolemia, history of pulmonary sarcoidosis (Dr. Jude), history of syncope 2020, no evidence of bradycardia arrhythmia by over 3 years of loop recorder monitoring, brief paroxysmal atrial fibrillation (asymptomatic, isolated 1 hour episode).  He has a history of nephrolithiasis and hematuria.  He has always been very physically active, enjoying playing tennis and riding his bike.  He plays doubles tennis at least once a week.  He rides his bike and/or uses his home elliptical several days a week.  He has not had any cardiovascular complaints since his last appointment.  He does have problems with hematuria that occurs roughly every 6 months, probably due to nephrolithiasis and leads to temporary interruption of his Eliquis  anticoagulation.  His urologist is Dr. Alvaro.  He has undergone multiple imaging procedures and cystoscopy, without identifying malignancy.  His most recent echocardiogram 08/05/2023 shows normal left ventricular systolic function with LVEF 60 to 65%, moderately dilated left atrium, mild MR, moderate TR, mild-moderate AAS with mean gradient 13 mmHg.  Studies Reviewed      EKG Interpretation Date/Time:    Ventricular Rate:    PR Interval:    QRS Duration:    QT Interval:    QTC Calculation:   R Axis:      Text Interpretation:          Personally reviewed his most  recent ECG from 09/14/2023 which shows Normal sinus rhythm, right bundle branch block, left axis deviation.  Echocardiogram 08/05/2023   1. Left ventricular ejection fraction, by estimation, is 60 to 65%. Left  ventricular ejection fraction by 3D volume is 63 %. The left ventricle has  normal function. The left ventricle has no regional wall motion  abnormalities. There is mild asymmetric  left ventricular hypertrophy of the basal-septal segment. Left ventricular  diastolic parameters were normal. The average left ventricular global  longitudinal strain is -26.4 %. The global longitudinal strain is normal.   2. Right ventricular systolic function is normal. The right ventricular  size is normal.   3. Left atrial size was moderately dilated.   4. Right atrial size was mild to moderately dilated.   5. The mitral valve is normal in structure. Mild mitral valve  regurgitation. No evidence of mitral stenosis.   6. Tricuspid valve regurgitation is moderate.   7. The aortic valve is tricuspid. There is moderate calcification of the  aortic valve. Aortic valve regurgitation is mild. Mild to moderate aortic  valve stenosis. Aortic valve area, by VTI measures 1.27 cm. Aortic valve  mean gradient measures 13.0  mmHg. Aortic valve Vmax measures 2.47 m/s.   8. Aortic dilatation noted. There is borderline dilatation of the  ascending aorta, measuring 39 mm.   Cardiac MRI 02/11/2019  1. Normal left ventricular size with mild LV hypertrophy. This appears concentric and not asymmetric. EF 60%, normal wall motion.   2. The RV is mildly dilated with mild to moderate hypokinesis, EF  35%.   3. There is no definite myocardial LGE, so no definitive evidence for prior MI, infiltrative disease, or myocarditis.   Abnormal right ventricle. However, there is no definitive evidence for cardiac sarcoidosis or hypertrophic cardiomyoapthy.   Cardiac catheterization 05/02/2019  Mid LM to Dist LM lesion is  30% stenosed. Prox LAD lesion is 30% stenosed. Mid LAD lesion is 20% stenosed. Dist LAD lesion is 40% stenosed.   Labs from PCP 06/18/2023 Potassium 4.8, creatinine 1.26, normal liver function tests (borderline AST 42), TSH 2.96, hemoglobin 14.7 Hemoglobin A1c 6.1% Cholesterol 93, HDL 44, LDL 36, triglycerides 57, LDL particle #712   Risk Assessment/Calculations  CHA2DS2-VASc Score = 3   This indicates a 3.2% annual risk of stroke. The patient's score is based upon: CHF History: 0 HTN History: 0 Diabetes History: 0 Stroke History: 0 Vascular Disease History: 1 Age Score: 2 Gender Score: 0         Physical Exam VS:  BP 130/60   Pulse (!) 50   Ht 5' 9 (1.753 m)   Wt 181 lb (82.1 kg)   BMI 26.73 kg/m        Wt Readings from Last 3 Encounters:  02/26/24 181 lb (82.1 kg)  09/14/23 186 lb 3.2 oz (84.5 kg)  03/31/23 187 lb (84.8 kg)    GEN: Well nourished, well developed in no acute distress NECK: No JVD; No carotid bruits CARDIAC: RRR, early peaking 1-2/6 aortic ejection murmur, no diastolic murmurs, rubs, gallops RESPIRATORY:  Clear to auscultation without rales, wheezing or rhonchi  ABDOMEN: Soft, non-tender, non-distended EXTREMITIES:  No edema; No deformity   ASSESSMENT AND PLAN  AS: Mild-moderate by echocardiogram performed earlier this year.  Asymptomatic despite the fact that his lifestyle is much more active than the average patient his age.  Continue physical activity, promptly inform us  if he begins to develop exertional angina/dyspnea/syncope.  Will follow with a yearly echocardiogram. CAD: Asymptomatic.  Heavy burden of calcified plaque by CT angiogram 2020, but without any evidence of meaningful stenoses by cardiac catheterization in 2020.  Focus on risk factor mitigation. AFib: Incidentally identified on loop recorder which was implanted for a diagnosis of syncope.  Asymptomatic.  Infrequent.  CHA2DS2-VASc score 3.  On Eliquis   anticoagulation. Anticoagulation: Every 6 months or so he has an episode of hematuria related to nephrolithiasis.  Otherwise no serious bleeding problems. HLP: Excellent metabolic control with most recent LDL of 36.  Continue rosuvastatin  and ezetimibe . PreDM: Mildly elevated hemoglobin A1c 6.1%.  Not on medications.       Patient Instructions    Testing/Procedures:  Your physician has requested that you have an echocardiogram. Echocardiography is a painless test that uses sound waves to create images of your heart. It provides your doctor with information about the size and shape of your heart and how well your heart's chambers and valves are working. This procedure takes approximately one hour. There are no restrictions for this procedure. Please do NOT wear cologne, perfume, aftershave, or lotions (deodorant is allowed). Please arrive 15 minutes prior to your appointment time.  Please note: We ask at that you not bring children with you during ultrasound (echo/ vascular) testing. Due to room size and safety concerns, children are not allowed in the ultrasound rooms during exams. Our front office staff cannot provide observation of children in our lobby area while testing is being conducted. An adult accompanying a patient to their appointment will only be allowed in the ultrasound room at the discretion  of the ultrasound technician under special circumstances. We apologize for any inconvenience. [address]-SCH[address]ULE MARCH 2026  Follow-Up: At [facility], you and your health needs are our priority.  As part of our continuing mission to provide you with exceptional heart care, our providers are all part of one team.  This team includes your primary Cardiologist (physician) and Advanced Practice Providers or APPs (Physician Assistants and Nurse Practitioners) who all work together to provide you with the care you need, when you need it.  Your next appointment:   6  month(s)  Provider:   JEREL BALDING MD              Signed, JEREL BALDING, MD

## 2024-02-26 NOTE — Patient Instructions (Signed)
   Testing/Procedures:  Your physician has requested that you have an echocardiogram. Echocardiography is a painless test that uses sound waves to create images of your heart. It provides your doctor with information about the size and shape of your heart and how well your heart's chambers and valves are working. This procedure takes approximately one hour. There are no restrictions for this procedure. Please do NOT wear cologne, perfume, aftershave, or lotions (deodorant is allowed). Please arrive 15 minutes prior to your appointment time.  Please note: We ask at that you not bring children with you during ultrasound (echo/ vascular) testing. Due to room size and safety concerns, children are not allowed in the ultrasound rooms during exams. Our front office staff cannot provide observation of children in our lobby area while testing is being conducted. An adult accompanying a patient to their appointment will only be allowed in the ultrasound room at the discretion of the ultrasound technician under special circumstances. We apologize for any inconvenience. [address]-SCHEDULE MARCH 2026  Follow-Up: At [facility], you and your health needs are our priority.  As part of our continuing mission to provide you with exceptional heart care, our providers are all part of one team.  This team includes your primary Cardiologist (physician) and Advanced Practice Providers or APPs (Physician Assistants and Nurse Practitioners) who all work together to provide you with the care you need, when you need it.  Your next appointment:   6 month(s)  Provider:   JEREL BALDING MD

## 2024-02-28 ENCOUNTER — Encounter: Payer: Self-pay | Admitting: Cardiovascular Disease

## 2024-03-01 NOTE — Progress Notes (Signed)
 Remote Loop Recorder Transmission

## 2024-03-14 ENCOUNTER — Encounter

## 2024-03-14 ENCOUNTER — Ambulatory Visit: Attending: Cardiovascular Disease

## 2024-03-14 DIAGNOSIS — I452 Bifascicular block: Secondary | ICD-10-CM

## 2024-03-15 LAB — CUP PACEART REMOTE DEVICE CHECK
Date Time Interrogation Session: 20251019230605
Implantable Pulse Generator Implant Date: 20201207

## 2024-03-16 ENCOUNTER — Ambulatory Visit: Payer: Self-pay | Admitting: Cardiovascular Disease

## 2024-03-18 NOTE — Progress Notes (Signed)
 Remote Loop Recorder Transmission

## 2024-03-21 ENCOUNTER — Other Ambulatory Visit: Payer: Self-pay | Admitting: Family Medicine

## 2024-03-21 DIAGNOSIS — S0093XA Contusion of unspecified part of head, initial encounter: Secondary | ICD-10-CM

## 2024-03-21 DIAGNOSIS — W19XXXA Unspecified fall, initial encounter: Secondary | ICD-10-CM

## 2024-03-23 ENCOUNTER — Other Ambulatory Visit

## 2024-03-25 ENCOUNTER — Ambulatory Visit
Admission: RE | Admit: 2024-03-25 | Discharge: 2024-03-25 | Disposition: A | Discharge status: Home / Self Care | Source: Ambulatory Visit | Attending: Family Medicine | Admitting: Family Medicine

## 2024-03-25 DIAGNOSIS — S0093XA Contusion of unspecified part of head, initial encounter: Secondary | ICD-10-CM

## 2024-03-25 DIAGNOSIS — W19XXXA Unspecified fall, initial encounter: Secondary | ICD-10-CM

## 2024-04-14 ENCOUNTER — Ambulatory Visit: Attending: Cardiovascular Disease

## 2024-04-14 ENCOUNTER — Encounter

## 2024-04-14 DIAGNOSIS — I452 Bifascicular block: Secondary | ICD-10-CM | POA: Diagnosis not present

## 2024-04-14 LAB — CUP PACEART REMOTE DEVICE CHECK
Date Time Interrogation Session: 20251119230412
Implantable Pulse Generator Implant Date: 20201207

## 2024-04-17 ENCOUNTER — Ambulatory Visit: Payer: Self-pay | Admitting: Cardiovascular Disease

## 2024-04-18 NOTE — Progress Notes (Signed)
 Remote Loop Recorder Transmission

## 2024-05-02 ENCOUNTER — Other Ambulatory Visit: Payer: Self-pay | Admitting: *Deleted

## 2024-05-02 DIAGNOSIS — I480 Paroxysmal atrial fibrillation: Secondary | ICD-10-CM

## 2024-05-02 MED ORDER — APIXABAN 5 MG PO TABS: 5.0000 mg | ORAL_TABLET | Freq: Two times a day (BID) | ORAL | 5 refills | Status: AC

## 2024-05-02 NOTE — Telephone Encounter (Signed)
 Eliquis  5mg  refill request received. Patient is 89 years old, weight-82.1kg, Crea-1.26 on 06/18/23 via scanned labs from Dr. Windy, Diagnosis-Afib, and last seen by Dr. Burnard on 09/14/23 and Dr. Francyne on 02/26/24. Dose is appropriate based on dosing criteria. Will send in refill to requested pharmacy.

## 2024-05-12 ENCOUNTER — Emergency Department (HOSPITAL_COMMUNITY)
Admission: EM | Admit: 2024-05-12 | Discharge: 2024-05-12 | Disposition: A | Discharge status: Home / Self Care | Source: Home / Self Care | Attending: Emergency Medicine | Admitting: Emergency Medicine

## 2024-05-12 ENCOUNTER — Emergency Department (HOSPITAL_COMMUNITY)

## 2024-05-12 ENCOUNTER — Other Ambulatory Visit: Payer: Self-pay

## 2024-05-12 DIAGNOSIS — S61412A Laceration without foreign body of left hand, initial encounter: Secondary | ICD-10-CM | POA: Insufficient documentation

## 2024-05-12 DIAGNOSIS — M542 Cervicalgia: Secondary | ICD-10-CM | POA: Insufficient documentation

## 2024-05-12 DIAGNOSIS — Y92410 Unspecified street and highway as the place of occurrence of the external cause: Secondary | ICD-10-CM | POA: Insufficient documentation

## 2024-05-12 DIAGNOSIS — Z7901 Long term (current) use of anticoagulants: Secondary | ICD-10-CM | POA: Diagnosis not present

## 2024-05-12 DIAGNOSIS — S6992XA Unspecified injury of left wrist, hand and finger(s), initial encounter: Secondary | ICD-10-CM | POA: Diagnosis present

## 2024-05-12 LAB — BASIC METABOLIC PANEL WITH GFR
Anion gap: 10 (ref 5–15)
BUN: 21 mg/dL (ref 8–23)
CO2: 23 mmol/L (ref 22–32)
Calcium: 10.7 mg/dL — ABNORMAL HIGH (ref 8.9–10.3)
Chloride: 104 mmol/L (ref 98–111)
Creatinine, Ser: 1.05 mg/dL (ref 0.61–1.24)
GFR, Estimated: >60 mL/min (ref >60)
Glucose, Bld: 104 mg/dL — ABNORMAL HIGH (ref 70–99)
Potassium: 4.4 mmol/L (ref 3.5–5.1)
Sodium: 137 mmol/L (ref 135–145)

## 2024-05-12 LAB — CBC
HCT: 42.6 % (ref 39.0–52.0)
Hemoglobin: 13.6 g/dL (ref 13.0–17.0)
MCH: 30.2 pg (ref 26.0–34.0)
MCHC: 31.9 g/dL (ref 30.0–36.0)
MCV: 94.5 fL (ref 80.0–100.0)
Platelets: 156 K/uL (ref 150–400)
RBC: 4.51 MIL/uL (ref 4.22–5.81)
RDW: 13.5 % (ref 11.5–15.5)
WBC: 8.8 K/uL (ref 4.0–10.5)
nRBC: 0.2 % (ref 0.0–0.2)

## 2024-05-12 MED ORDER — LIDOCAINE-EPINEPHRINE (PF) 2 %-1:200000 IJ SOLN
INTRAMUSCULAR | Status: AC
  Administered 2024-05-12: 12:00:00 20 mL
  Filled 2024-05-12: qty 20

## 2024-05-12 MED ORDER — LIDOCAINE HCL 2 % IJ SOLN
10.0000 mL | Freq: Once | INTRAMUSCULAR | Status: DC
  Filled 2024-05-12: qty 20

## 2024-05-12 MED ORDER — CEFAZOLIN SODIUM-DEXTROSE 2-4 GM/100ML-% IV SOLN
2.0000 g | INTRAVENOUS | Status: AC
  Administered 2024-05-12: 10:00:00 2 g via INTRAVENOUS
  Filled 2024-05-12: qty 100

## 2024-05-12 MED ORDER — CEPHALEXIN 500 MG PO CAPS: 500.0000 mg | ORAL_CAPSULE | Freq: Three times a day (TID) | ORAL | 0 refills | Status: AC

## 2024-05-12 NOTE — ED Notes (Signed)
 Patient transported to X-ray
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
## 2024-05-13 NOTE — Telephone Encounter (Signed)
 Rec'd call for Eugene [name] at [facility] office- Patient came into office today.  Was seen in ED yesterday with hand laceration.  Sutured up by Dr Delene.  Came into office today b/c hand was bleeding and office couldn't get it to stop.  Called us  and wanted pt to be seen.  Spoke with Allison,PA-C and called office back to inform them the patient needed to go back to the ED to get the hand looked at.

## 2024-05-13 NOTE — Telephone Encounter (Signed)
 Patient scheduled for Monday 05/16/24 at 11:15 per directive.
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
## 2024-05-14 ENCOUNTER — Other Ambulatory Visit: Payer: Self-pay

## 2024-05-14 ENCOUNTER — Emergency Department (HOSPITAL_BASED_OUTPATIENT_CLINIC_OR_DEPARTMENT_OTHER)
Admission: EM | Admit: 2024-05-14 | Discharge: 2024-05-14 | Disposition: A | Discharge status: Home / Self Care | Attending: Emergency Medicine | Admitting: Emergency Medicine

## 2024-05-14 DIAGNOSIS — Z4800 Encounter for change or removal of nonsurgical wound dressing: Secondary | ICD-10-CM | POA: Insufficient documentation

## 2024-05-14 DIAGNOSIS — Z7901 Long term (current) use of anticoagulants: Secondary | ICD-10-CM | POA: Diagnosis not present

## 2024-05-14 DIAGNOSIS — S61412D Laceration without foreign body of left hand, subsequent encounter: Secondary | ICD-10-CM
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
## 2024-05-14 NOTE — ED Provider Notes (Signed)
 10:59 AM Patient seen in conjunction with Fondow PA-C.  Patient here for wound recheck.  Also requesting dressing change.  Wounds appear clean, dry, intact.  There are a few missing areas of tissue which will heal secondarily.  Mild venous oozing, but no significant bleeding.  Wound looks good, encouraged follow-up as planned.   Desiderio Chew, PA-C 05/14/24 1100    Ruthe Cornet, DO 05/14/24 1155

## 2024-05-14 NOTE — Discharge Instructions (Signed)
 Follow-up with your hand surgeon on Monday.  Continue to keep the area clean and dry.  If you have any worsening symptoms please return to the ER.

## 2024-05-14 NOTE — ED Notes (Signed)
 Wound redressed with sterile non-adherent gauze and kerlex.
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
## 2024-05-15 ENCOUNTER — Ambulatory Visit

## 2024-05-15 DIAGNOSIS — I452 Bifascicular block: Secondary | ICD-10-CM

## 2024-05-16 ENCOUNTER — Encounter

## 2024-05-16 LAB — CUP PACEART REMOTE DEVICE CHECK
Date Time Interrogation Session: 20251220230342
Implantable Pulse Generator Implant Date: 20201207

## 2024-05-16 NOTE — Progress Notes (Signed)
 Remote Loop Recorder Transmission

## 2024-05-21 ENCOUNTER — Ambulatory Visit: Payer: Self-pay | Admitting: Cardiovascular Disease

## 2024-06-06 ENCOUNTER — Telehealth: Payer: Self-pay

## 2024-06-06 NOTE — Telephone Encounter (Signed)
 MDT [id number] - ILR @ RRT ON 06/03/24.    ILR @ RRT   ILR reached RRT:  Patient called, discussed options to leave device in or explanted.  Patient would like to   Marked I in Paceart:  Enter note in Paceart: Canceled future remotes: Discontinued from website: Entered in Speciality Comments:  Advised if further questions arise to please call the device clinic at [phone].

## 2024-06-07 NOTE — Telephone Encounter (Signed)
 Discontinue downloads.  No plan to replace or remove the current device.  Thank you

## 2024-06-07 NOTE — Telephone Encounter (Signed)
 Unable to speak w/ patient regarding ILR reaching RRT. Voicemail left on number listed requesting call back.   Will continue to monitor and update accordingly.

## 2024-06-07 NOTE — Telephone Encounter (Signed)
 ILR @ RRT    ILR reached RRT: 06/03/2024 Patient called, discussed options to leave device in or explanted.  Patient would like to leave device in place. Patient specifically request to notify Dr. Francyne and let him know his device has reached RRT and for any further recommendations. Patient states there is no reason to call back if there is no further recommendations and he will call for any further questions or concerns.    Marked I in Paceart: Yes Enter note in Paceart: Yes Canceled future remotes: Yes Discontinued from website: Yes Entered in Speciality Comments: Yes   Advised if further questions arise to please call the device clinic at [phone].

## 2024-06-07 NOTE — Telephone Encounter (Signed)
Pt. lvm returning call

## 2024-06-15 ENCOUNTER — Ambulatory Visit

## 2024-07-16 ENCOUNTER — Ambulatory Visit

## 2024-07-26 ENCOUNTER — Other Ambulatory Visit (HOSPITAL_COMMUNITY)

## 2024-08-12 ENCOUNTER — Ambulatory Visit: Admitting: Cardiovascular Disease

## 2024-08-16 ENCOUNTER — Ambulatory Visit
